# Patient Record
Sex: Male | Born: 1945
Health system: Southern US, Community
[De-identification: ages and names within clinical notes are randomized; demographics above are authoritative.]

## PROBLEM LIST (undated history)

## (undated) DIAGNOSIS — N400 Enlarged prostate without lower urinary tract symptoms: Secondary | ICD-10-CM

## (undated) DIAGNOSIS — F32A Depression, unspecified: Secondary | ICD-10-CM

## (undated) DIAGNOSIS — I499 Cardiac arrhythmia, unspecified: Secondary | ICD-10-CM

## (undated) DIAGNOSIS — K219 Gastro-esophageal reflux disease without esophagitis: Secondary | ICD-10-CM

## (undated) DIAGNOSIS — F329 Major depressive disorder, single episode, unspecified: Secondary | ICD-10-CM

## (undated) DIAGNOSIS — I10 Essential (primary) hypertension: Secondary | ICD-10-CM

## (undated) DIAGNOSIS — J189 Pneumonia, unspecified organism: Secondary | ICD-10-CM

## (undated) DIAGNOSIS — F419 Anxiety disorder, unspecified: Secondary | ICD-10-CM

## (undated) HISTORY — PX: COSMETIC SURGERY: SHX468

## (undated) HISTORY — PX: COLONOSCOPY: SHX5424

## (undated) HISTORY — PX: HERNIA REPAIR: SHX51

---

## 1998-01-06 ENCOUNTER — Ambulatory Visit (HOSPITAL_COMMUNITY): Admission: RE | Admit: 1998-01-06 | Discharge: 1998-01-06 | Payer: Self-pay | Admitting: Cardiology

## 1998-11-21 ENCOUNTER — Ambulatory Visit (HOSPITAL_COMMUNITY): Admission: RE | Admit: 1998-11-21 | Discharge: 1998-11-21 | Payer: Self-pay | Admitting: Gastroenterology

## 1999-05-25 ENCOUNTER — Emergency Department (HOSPITAL_COMMUNITY): Admission: EM | Admit: 1999-05-25 | Discharge: 1999-05-25 | Payer: Self-pay | Admitting: Internal Medicine

## 1999-09-28 ENCOUNTER — Encounter: Payer: Self-pay | Admitting: General Surgery

## 1999-09-29 ENCOUNTER — Observation Stay (HOSPITAL_COMMUNITY): Admission: EM | Admit: 1999-09-29 | Discharge: 1999-09-30 | Payer: Self-pay | Admitting: General Surgery

## 2002-02-06 ENCOUNTER — Emergency Department (HOSPITAL_COMMUNITY): Admission: EM | Admit: 2002-02-06 | Discharge: 2002-02-06 | Payer: Self-pay

## 2002-02-06 ENCOUNTER — Encounter: Payer: Self-pay | Admitting: Internal Medicine

## 2003-02-17 ENCOUNTER — Ambulatory Visit (HOSPITAL_COMMUNITY): Admission: RE | Admit: 2003-02-17 | Discharge: 2003-02-17 | Payer: Self-pay | Admitting: Geriatric Medicine

## 2004-02-24 ENCOUNTER — Ambulatory Visit (HOSPITAL_COMMUNITY): Admission: RE | Admit: 2004-02-24 | Discharge: 2004-02-24 | Payer: Self-pay | Admitting: Gastroenterology

## 2008-07-01 ENCOUNTER — Encounter: Admission: RE | Admit: 2008-07-01 | Discharge: 2008-07-01 | Payer: Self-pay | Admitting: Geriatric Medicine

## 2010-05-19 NOTE — Op Note (Signed)
NAMEFLAVIO, LINDROTH            ACCOUNT NO.:  [id number]   MEDICAL RECORD NO.:  [id number]          PATIENT TYPE:  AMB   LOCATION:  ENDO                         FACILITY:  [facility]   PHYSICIAN:  Bernette Redbird, M.D.   DATE OF BIRTH:  Jul 27, 1945   DATE OF PROCEDURE:  02/24/2004  DATE OF DISCHARGE:                                 OPERATIVE REPORT   PROCEDURE:  Colonoscopy.   INDICATIONS FOR PROCEDURE:  Colon cancer screening.  Negative colonoscopy  five years ago.   FINDINGS:  Normal exam to the cecum.   DESCRIPTION OF PROCEDURE:  The nature, purpose, and risks of the procedure  were familiar to the patient from prior examination, and he provided written  consent.  He opted for this procedure over lesser forms of screening.  Digital exam of the prostate was unremarkable.  Sedation totaled Fentanyl 50  mcg and Versed 7 mg.   The Olympus adjustable tension pediatric videocolonoscope was readily  advanced to the proximal colon whereupon, with the patient in the supine  position and some external abdominal compression, I was able to reach the  base of the cecum as identified by absence of further lumen and clear  visualization of the appendiceal orifice, whereupon pullback was performed.  The quality of the prep was very good, and very little rinsing was needed.  It is felt that all areas were well-seen.   This was a normal examination.  No polyps, cancer, colitis, vascular  malformations, or diverticulosis were noted.  Retroflexion in the rectum was  unremarkable.  No biopsies were obtained.  The patient tolerated the  procedure well, and there were no apparent complications.   IMPRESSION:  Unremarkable screening colonoscopy in a standard risk  individual.  (V76.51).   PLAN:  Flexible sigmoidoscopy in five years for continued screening.   NOTE:  Latex-free gloves were used for this procedure because of history of  a severe latex allergy in the patient's wife who will be driving  him home  today.      RB/MEDQ  D:  02/24/2004  T:  02/24/2004  Job:  [id number]   cc:   Hal T. Stoneking, M.D.  [address] [place], [place] [address]  Fax: [phone]

## 2010-05-19 NOTE — Consult Note (Signed)
NAME:  Kevin Kim, Kevin Kim                      ACCOUNT NO.:  [id number]   MEDICAL RECORD NO.:  [id number]                   PATIENT TYPE:  EMS   LOCATION:  MAJO                                 FACILITY:  [facility]   PHYSICIAN:  Lonia Blood, M.D.                   DATE OF BIRTH:  [birth year]   DATE OF CONSULTATION:  DATE OF DISCHARGE:                                   CONSULTATION   REASON FOR CONSULTATION:  Facial weakness and slurred speech.   HISTORY OF PRESENT ILLNESS:  This is a 64 year old, white male with history  of GERD and previous irregular heart who presented to the emergency room  with a one day history of right-sided facial weakness, decreased taste on  the right side of his tongue and slurred speech.  The patient said symptoms  came suddenly yesterday at work and has been persistent since then.  He  initially called his doctor, Dr. Doran Stabler, who that it might be Bell's  palsy and he was reluctant to come to the hospital until this morning when  he felt like his lower lip is also involved.  He reported pain behind he ear  two to three days before this onset. Also, the patient says he has had what  he calls fever blisters around the right side of his lower lip four weeks  ago.   PAST MEDICAL HISTORY:  1. GERD.  2. 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  3. Status post triple hernia repair three years ago.   ALLERGIES:  No known drug allergies.   MEDICATIONS:  1. Aciphex 20 mg p.o. every day.  2. Centrum Silver multivitamin, one daily.  3. Excedrin, two daily.   REVIEW OF SYSTEMS:  Negative for fever, negative for earache, negative for  nausea and vomiting, negative for weakness, negative for falls, negative for  diplopia or swallowing difficulties.   FAMILY HISTORY:  His mother died of breast cancer, father died of esophageal  cancer.  Grandfather died in his 73s of  stroke.   SOCIAL HISTORY:  He lives in [place] with his wife, he has five children.  His wife works as a Copy.  The patient works at the [employer], usually doing heavy lifting.  He denied any tobacco but does take  occasional alcohol.   PHYSICAL EXAMINATION:  VITAL SIGNS:  Temperature is 97.4, blood pressure  141/85, pulse 76, respiratory rate of 20, saturation 98% on room air.  GENERAL:  He is stable, in no acute distress.  He is a pleasant man.  HEENT:  He has right facial asymmetry.  Pupils are equally round and  reactive to light.  EOMI.  NECK:  Supple, no JVD, no lymphadenopathy.  CHEST:  Clear to auscultation bilaterally.  CARDIOVASCULAR:  Regular rate and rhythm.  ABDOMEN:  Soft,  nontender, positive bowel sounds.  EXTREMITIES:  Show no edema, cyanosis or clubbing.  NEUROLOGICAL:  Alert and oriented times three with right facial droop.  Loss  of right nasolabial fold, but sparing of his forehead.  The patient had good  eye closure, normal sensation in the face, has normal power, 5/5 of both  extremities.  Normal reflexes in both upper and lower extremities as well as  normal sensation, normal gait with nonfocal neurological exam outside the  face.   LABORATORY DATA:  Showed a white count of 5.3, hemoglobin 15, platelets 185,  sodium 141, potassium 4, chloride 108, CO2 27, BUN 17, creatinine 0.8,  glucose 108, calcium 9.1.  He had a CK of 18, MB of 2.0, troponin less than  0.01.  He had an EKG that showed ST-T wave depression in lateral leads as  well as T-wave inversion which, however, historically were not new and were  thought to be due to his abnormal cardiac conduction.  He had a head CT that  was negative for any acute abnormality.   IMPRESSION:  This is a 65 year old, white male with the sudden onset of  right facial weakness, numbness and slurring, most likely diagnosis Bell's  palsy. Other differentials which are very unlikely include Lyme  disease  (this is the wrong season), inner ear tumor, Ramsay Hunt syndrome, HIV  disease, etc.   PLAN:  Discharge the patient home on ganciclovir one gram t.i.d. for seven  days and also Predinsone 60 mg p.o. every day for seven day.  I also  instructed the patient to return to the emergency room to call his doctor if  the symptoms worsen, last more than three weeks or if there is no  improvement in his symptoms after three weeks from today.                                               Lonia Blood, M.D.    Verlin Grills  D:  02/06/2002  T:  02/07/2002  Job:  [id number]   cc:   Cassell Clement, M.D.  [address], [address]  [place]  [place] [address]  Fax: [phone]

## 2010-05-19 NOTE — Op Note (Signed)
[facility]  Patient:    Kevin Kim, Kevin Kim                   MRN: [id number] Proc. Date: 09/29/99 Adm. Date:  [id number] Attending:  Brandy Hale CC:         Clovis Pu. Patty Sermons, M.D.   Operative Report  PREOPERATIVE DIAGNOSES: 1. Bilateral inguinal hernias. 2. Umbilical hernia.  POSTOPERATIVE DIAGNOSES: 1. Bilateral inguinal hernias. 2. Umbilical hernia.  OPERATION PERFORMED: 1. Laparoscopic repair bilateral inguinal hernias (preperitoneal). 2. Umbilical herniorrhaphy.  SURGEON:  Dr. Claud Kelp.  FIRST ASSISTANT:  Dr. Abigail Miyamoto.  INDICATIONS FOR PROCEDURE:  This is a 65 year old white man who presented with fairly recent onset of a painful bulge in this left groin. On examination, he has a moderate sized left inguinal hernia and a small right inguinal hernia and a small umbilical hernia. All of these are reducible. He is brought to the operating room electively for repair of his hernias.  TECHNIQUE:  Following the induction of general endotracheal anesthesia, a Foley catheter was inserted and the bladder was emptied. The abdomen and genitalia were prepped and draped in a sterile fashion. Then 0.5% Marcaine with epinephrine was used as a local infiltration anesthetic. A transverse incision was made below the umbilicus. The fascia was incised transversely exposing the left rectus muscle. The left rectus muscle was retracted laterally and we bluntly dissected the space behind the left rectus muscle and the left rectus sheath. We inserted a dissector balloon in the left rectus sheath and this was inflated manually under direct vision. We had good deployment of the balloon and good visualization of the rectus muscles anteriorly, preperitoneal fat posteriorly, epigastric vessels laterally and at the symphysis pubis inferiorly. We held the balloon in place for 3 or 4 minutes and then deflated the balloon and removed it. We  inserted the insufflating trocar and secured this with the balloon and connected to the insufflator at 12 mmHg. Once inflated, we had good visualization. We put two 5 mm trocars in the midline between the symphysis pubis and the umbilicus. The peritoneum was dissected away laterally where necessary. On the left side, we dissected out a large indirect hernia sac which was dissected away from the cord structures all the way back to the level of the anterior superior iliac spine. The patient also appeared to have a direct hernia on the left hand side. On the right hand side, the patient had a small indirect hernia and we were able to strip that sac well back to the level of the anterior superior iliac spine. We repaired the hernias on each side using a 4 inch x 6 inch piece of polypropylene mesh. On each side, the mesh was placed so as to slightly overlap in the midline. The mesh was tacked to the superior rim of the symphysis pubis and to the superior rim of Coopers ligament with about 5 tacks. We then placed tacks along the posterior belly of the rectus muscle on each side and then laterally, lateral to the inferior epigastric vessels we placed several tacks but laterally we very careful to be sure we could palpate through the abdominal wall to make sure that we did not place any of the tacking devices below the ileopubic tract. On the left hand side, the large indirect hernia sac was then tacked to the mesh laterally with a tacking device to prevent recurrence of the indirect hernia. The areas of repair bilaterally were inspected and  we felt the mesh was deployed smoothly and completely bilaterally without any defects. The pneumoperitoneum was released.  We then turned our attention to the umbilical hernia. We dissected the umbilicus off of the fascia and exposed umbilical hernia defect about 1.5 cm in diameter. We undermined the subcutaneous tissue a little bit. We repaired the umbilical  hernia with 2 interrupted mattress sutures of #0 Novofil. These sutures were placed in a vest-of-pants fashion and then the sutures were tied and this provided good repair with good overlap of the fascia. The fascia inferiorly where we had exposed the left rectus muscle was closed with 2 interrupted figure-of-eight sutures of #0 Novofil. This provided very secure repair of the umbilical hernia and the fascia where the trocars were. The wound was irrigated with saline. The umbilicus was tacked back down to the fascia with 3-0 Vicryl suture. The subcutaneous tissue was closed with interrupted sutures of 3-0 Vicryl and the skin incisions were closed with subcuticular sutures of 4-0 Vicryl and Steri-Strips. Clean bandages were placed and the patient taken to the recovery room in stable condition. Estimated blood loss was about 20 cc. Complications none. Sponge, needle and instrument counts were correct. DD:  09/29/99 TD:  09/29/99 Job: [id number]

## 2010-12-09 ENCOUNTER — Emergency Department (HOSPITAL_COMMUNITY)
Admission: EM | Admit: 2010-12-09 | Discharge: 2010-12-09 | Disposition: A | Discharge status: Home / Self Care | Payer: Federal, State, Local not specified - PPO | Attending: Emergency Medicine | Admitting: Emergency Medicine

## 2010-12-09 DIAGNOSIS — K219 Gastro-esophageal reflux disease without esophagitis: Secondary | ICD-10-CM | POA: Insufficient documentation

## 2010-12-09 DIAGNOSIS — R209 Unspecified disturbances of skin sensation: Secondary | ICD-10-CM | POA: Insufficient documentation

## 2010-12-09 DIAGNOSIS — B029 Zoster without complications: Secondary | ICD-10-CM | POA: Insufficient documentation

## 2010-12-09 HISTORY — DX: Gastro-esophageal reflux disease without esophagitis: K21.9

## 2010-12-09 MED ORDER — PREDNISONE 50 MG PO TABS: ORAL_TABLET | ORAL | Status: DC

## 2010-12-09 MED ORDER — VALACYCLOVIR HCL 1 G PO TABS: 1000.0000 mg | ORAL_TABLET | Freq: Three times a day (TID) | ORAL | Status: AC

## 2010-12-09 NOTE — ED Notes (Signed)
Noticed a pimple like lesion along his left lower chin yesterday--The pimple began to drain and he awoke this a.m. To numbness/tingling sensation of the left side of his face.  No change in taste, no other neuro deficits---Alert and oriented x's 3---Reports having similar type episode several yrs ago which was diagnosed as Bell's Palsy

## 2010-12-09 NOTE — ED Provider Notes (Signed)
History     CSN: [id number] Arrival date & time: 12/09/2010  1:22 PM   First MD Initiated Contact with Patient 12/09/10 1503      Chief Complaint  Patient presents with  . Numbness    (Consider location/radiation/quality/duration/timing/severity/associated sxs/prior treatment) The history is provided by the patient.   the patient is a 65 year old male, with a history of GERD.  He states he recently returned from a trip to [place].  He states this morning when he woke up.  He had numbness on the left side of his face with a single lesion at the corner of his mouth on the left side.  He says that it is as it.  She has not had in many years.  He denies pain anywhere.  He denies vision changes, nausea, vomiting, sore throat, earaches, dizziness, weakness, or paresthesias.  He says that he has had Bell's palsy in the past on the left side about 8 years ago.  He denies smoking.  He denies history of diabetes, hypertension, or coronary artery disease.    Past Medical History  Diagnosis Date  . GERD (gastroesophageal reflux disease)     Past Surgical History  Procedure Date  . Hernia repair     No family history on file.  History  Substance Use Topics  . Smoking status: Current Some Day Smoker  . Smokeless tobacco: Not on file  . Alcohol Use: Yes      Review of Systems  Constitutional: Negative for fever and diaphoresis.  HENT: Negative for neck pain.   Eyes: Negative for redness and visual disturbance.  Respiratory: Negative for cough, chest tightness and shortness of breath.   Cardiovascular: Negative for chest pain and palpitations.  Gastrointestinal: Negative for nausea, vomiting and abdominal pain.  Musculoskeletal: Negative for back pain.  Skin: Negative for rash.       Single 4 mm crusted lesion at the corner of his mouth on the left  Neurological: Positive for numbness. Negative for dizziness, facial asymmetry, weakness and headaches.  Psychiatric/Behavioral:  Negative for confusion.    Allergies  Review of patient's allergies indicates no known allergies.  Home Medications   Current Outpatient Rx  Name Route Sig Dispense Refill  . ASPIRIN 325 MG PO TABS Oral Take 325 mg by mouth daily.      Marland Kitchen LANSOPRAZOLE 30 MG PO CPDR Oral Take 30 mg by mouth daily.      Carma Leaven M PLUS PO TABS Oral Take 1 tablet by mouth daily. Centrum Silver     . VENLAFAXINE HCL 37.5 MG PO TABS Oral Take 37.5 mg by mouth 2 (two) times daily.        BP 136/84  Pulse 75  Temp 98.5 F (36.9 C)  Resp 20  SpO2 100%  Physical Exam  Vitals reviewed. Constitutional: He is oriented to person, place, and time. He appears well-developed and well-nourished. No distress.  HENT:  Head: Normocephalic and atraumatic.  Eyes: EOM are normal. Pupils are equal, round, and reactive to light.  Neck: Normal range of motion. Neck supple.  Cardiovascular: Normal rate, regular rhythm and normal heart sounds.   No murmur heard. Pulmonary/Chest: Effort normal and breath sounds normal. No respiratory distress. He has no wheezes. He has no rales.  Abdominal: Soft. Bowel sounds are normal. He exhibits no distension and no mass. There is no tenderness. There is no rebound and no guarding.  Musculoskeletal: Normal range of motion. He exhibits no edema and no tenderness.  Neurological: He is alert and oriented to person, place, and time. He has normal strength. No cranial nerve deficit. Coordination normal. GCS eye subscore is 4. GCS verbal subscore is 5. GCS motor subscore is 6.       Facial features are symmetric bilaterally.  There is no evidence of a facial nerve deficit.  On the left-hand side at this time.  He does complain of decreased sensation over the maxilla and mandible on the left side, consistent with numbness over the maxillary and mandibular branches of the trigeminal nerve.  Skin: Skin is warm and dry. He is not diaphoretic.       Single 4 mm crusted lesion at the corner of his  mouth on the left-hand side. No rash.  No vesicle  Psychiatric: He has a normal mood and affect. His behavior is normal.    ED Course  Procedures (including critical care time) 65 year old male, with no significant past medical history, and no risk factors for coronary disease for stroke except for his age and sex.  He complains of numbness over the left cheek, and jaw.  He has an isolated crusted lesion at the corner of his mouth on the left-hand side.  There is no evidence of a cranial nerve deficit or peripheral nerve deficit.  He has no carotid bruits.  This may be a recurrence of his Bell's palsy or early shingles.  There is no indication for testing in the emergency department.  At this time.  Labs Reviewed - No data to display No results found.   No diagnosis found.    MDM  Bell's palsy versus shingles No evidence of stroke or systemic illness        Nicholes Stairs, MD 12/09/10 1539

## 2010-12-09 NOTE — ED Notes (Signed)
Pt in with c/o left arm numbness and face states onset 0900 denies weakness denies pain pt present with no other obvious neuro deficits

## 2011-07-04 DIAGNOSIS — Z0000 Encounter for general adult medical examination without abnormal findings: Secondary | ICD-10-CM | POA: Diagnosis not present

## 2011-07-04 DIAGNOSIS — Z23 Encounter for immunization: Secondary | ICD-10-CM | POA: Diagnosis not present

## 2011-07-04 DIAGNOSIS — Z125 Encounter for screening for malignant neoplasm of prostate: Secondary | ICD-10-CM | POA: Diagnosis not present

## 2011-07-04 DIAGNOSIS — Z79899 Other long term (current) drug therapy: Secondary | ICD-10-CM | POA: Diagnosis not present

## 2011-08-07 DIAGNOSIS — J069 Acute upper respiratory infection, unspecified: Secondary | ICD-10-CM | POA: Diagnosis not present

## 2011-10-26 DIAGNOSIS — S0100XA Unspecified open wound of scalp, initial encounter: Secondary | ICD-10-CM | POA: Diagnosis not present

## 2012-01-08 DIAGNOSIS — K219 Gastro-esophageal reflux disease without esophagitis: Secondary | ICD-10-CM | POA: Diagnosis not present

## 2012-01-08 DIAGNOSIS — F329 Major depressive disorder, single episode, unspecified: Secondary | ICD-10-CM | POA: Diagnosis not present

## 2012-02-29 DIAGNOSIS — J069 Acute upper respiratory infection, unspecified: Secondary | ICD-10-CM | POA: Diagnosis not present

## 2012-04-22 DIAGNOSIS — M25519 Pain in unspecified shoulder: Secondary | ICD-10-CM | POA: Diagnosis not present

## 2012-05-01 DIAGNOSIS — M719 Bursopathy, unspecified: Secondary | ICD-10-CM | POA: Diagnosis not present

## 2012-05-01 DIAGNOSIS — M25669 Stiffness of unspecified knee, not elsewhere classified: Secondary | ICD-10-CM | POA: Diagnosis not present

## 2012-05-01 DIAGNOSIS — M25519 Pain in unspecified shoulder: Secondary | ICD-10-CM | POA: Diagnosis not present

## 2012-05-06 DIAGNOSIS — M719 Bursopathy, unspecified: Secondary | ICD-10-CM | POA: Diagnosis not present

## 2012-05-06 DIAGNOSIS — M67919 Unspecified disorder of synovium and tendon, unspecified shoulder: Secondary | ICD-10-CM | POA: Diagnosis not present

## 2012-05-06 DIAGNOSIS — M25669 Stiffness of unspecified knee, not elsewhere classified: Secondary | ICD-10-CM | POA: Diagnosis not present

## 2012-05-06 DIAGNOSIS — M25519 Pain in unspecified shoulder: Secondary | ICD-10-CM | POA: Diagnosis not present

## 2012-05-08 DIAGNOSIS — M25669 Stiffness of unspecified knee, not elsewhere classified: Secondary | ICD-10-CM | POA: Diagnosis not present

## 2012-05-08 DIAGNOSIS — M25519 Pain in unspecified shoulder: Secondary | ICD-10-CM | POA: Diagnosis not present

## 2012-05-08 DIAGNOSIS — M719 Bursopathy, unspecified: Secondary | ICD-10-CM | POA: Diagnosis not present

## 2012-05-12 DIAGNOSIS — M67919 Unspecified disorder of synovium and tendon, unspecified shoulder: Secondary | ICD-10-CM | POA: Diagnosis not present

## 2012-05-12 DIAGNOSIS — M25669 Stiffness of unspecified knee, not elsewhere classified: Secondary | ICD-10-CM | POA: Diagnosis not present

## 2012-05-12 DIAGNOSIS — M25519 Pain in unspecified shoulder: Secondary | ICD-10-CM | POA: Diagnosis not present

## 2012-05-13 DIAGNOSIS — M25519 Pain in unspecified shoulder: Secondary | ICD-10-CM | POA: Diagnosis not present

## 2012-05-15 DIAGNOSIS — M67919 Unspecified disorder of synovium and tendon, unspecified shoulder: Secondary | ICD-10-CM | POA: Diagnosis not present

## 2012-05-15 DIAGNOSIS — M25669 Stiffness of unspecified knee, not elsewhere classified: Secondary | ICD-10-CM | POA: Diagnosis not present

## 2012-05-15 DIAGNOSIS — M25519 Pain in unspecified shoulder: Secondary | ICD-10-CM | POA: Diagnosis not present

## 2012-05-19 DIAGNOSIS — M19019 Primary osteoarthritis, unspecified shoulder: Secondary | ICD-10-CM | POA: Diagnosis not present

## 2012-05-20 DIAGNOSIS — M719 Bursopathy, unspecified: Secondary | ICD-10-CM | POA: Diagnosis not present

## 2012-05-20 DIAGNOSIS — M67919 Unspecified disorder of synovium and tendon, unspecified shoulder: Secondary | ICD-10-CM | POA: Diagnosis not present

## 2012-05-20 DIAGNOSIS — M25669 Stiffness of unspecified knee, not elsewhere classified: Secondary | ICD-10-CM | POA: Diagnosis not present

## 2012-05-20 DIAGNOSIS — M25519 Pain in unspecified shoulder: Secondary | ICD-10-CM | POA: Diagnosis not present

## 2012-05-22 DIAGNOSIS — M19019 Primary osteoarthritis, unspecified shoulder: Secondary | ICD-10-CM | POA: Diagnosis not present

## 2012-05-28 DIAGNOSIS — M67919 Unspecified disorder of synovium and tendon, unspecified shoulder: Secondary | ICD-10-CM | POA: Diagnosis not present

## 2012-05-28 DIAGNOSIS — M25669 Stiffness of unspecified knee, not elsewhere classified: Secondary | ICD-10-CM | POA: Diagnosis not present

## 2012-05-28 DIAGNOSIS — M25519 Pain in unspecified shoulder: Secondary | ICD-10-CM | POA: Diagnosis not present

## 2012-05-28 DIAGNOSIS — M719 Bursopathy, unspecified: Secondary | ICD-10-CM | POA: Diagnosis not present

## 2012-05-30 DIAGNOSIS — M25519 Pain in unspecified shoulder: Secondary | ICD-10-CM | POA: Diagnosis not present

## 2012-05-30 DIAGNOSIS — M67919 Unspecified disorder of synovium and tendon, unspecified shoulder: Secondary | ICD-10-CM | POA: Diagnosis not present

## 2012-05-30 DIAGNOSIS — M25669 Stiffness of unspecified knee, not elsewhere classified: Secondary | ICD-10-CM | POA: Diagnosis not present

## 2012-06-02 DIAGNOSIS — M719 Bursopathy, unspecified: Secondary | ICD-10-CM | POA: Diagnosis not present

## 2012-06-02 DIAGNOSIS — M25519 Pain in unspecified shoulder: Secondary | ICD-10-CM | POA: Diagnosis not present

## 2012-06-02 DIAGNOSIS — M67919 Unspecified disorder of synovium and tendon, unspecified shoulder: Secondary | ICD-10-CM | POA: Diagnosis not present

## 2012-06-02 DIAGNOSIS — M25669 Stiffness of unspecified knee, not elsewhere classified: Secondary | ICD-10-CM | POA: Diagnosis not present

## 2012-06-04 DIAGNOSIS — M25519 Pain in unspecified shoulder: Secondary | ICD-10-CM | POA: Diagnosis not present

## 2012-06-04 DIAGNOSIS — M719 Bursopathy, unspecified: Secondary | ICD-10-CM | POA: Diagnosis not present

## 2012-06-04 DIAGNOSIS — M25669 Stiffness of unspecified knee, not elsewhere classified: Secondary | ICD-10-CM | POA: Diagnosis not present

## 2012-06-09 DIAGNOSIS — M25669 Stiffness of unspecified knee, not elsewhere classified: Secondary | ICD-10-CM | POA: Diagnosis not present

## 2012-06-09 DIAGNOSIS — M25519 Pain in unspecified shoulder: Secondary | ICD-10-CM | POA: Diagnosis not present

## 2012-06-09 DIAGNOSIS — M67919 Unspecified disorder of synovium and tendon, unspecified shoulder: Secondary | ICD-10-CM | POA: Diagnosis not present

## 2012-06-09 DIAGNOSIS — M719 Bursopathy, unspecified: Secondary | ICD-10-CM | POA: Diagnosis not present

## 2012-06-11 DIAGNOSIS — M25669 Stiffness of unspecified knee, not elsewhere classified: Secondary | ICD-10-CM | POA: Diagnosis not present

## 2012-06-11 DIAGNOSIS — M25519 Pain in unspecified shoulder: Secondary | ICD-10-CM | POA: Diagnosis not present

## 2012-06-11 DIAGNOSIS — M719 Bursopathy, unspecified: Secondary | ICD-10-CM | POA: Diagnosis not present

## 2012-06-11 DIAGNOSIS — M67919 Unspecified disorder of synovium and tendon, unspecified shoulder: Secondary | ICD-10-CM | POA: Diagnosis not present

## 2012-06-16 DIAGNOSIS — M25519 Pain in unspecified shoulder: Secondary | ICD-10-CM | POA: Diagnosis not present

## 2012-06-16 DIAGNOSIS — M25669 Stiffness of unspecified knee, not elsewhere classified: Secondary | ICD-10-CM | POA: Diagnosis not present

## 2012-06-16 DIAGNOSIS — M719 Bursopathy, unspecified: Secondary | ICD-10-CM | POA: Diagnosis not present

## 2012-07-07 DIAGNOSIS — Z79899 Other long term (current) drug therapy: Secondary | ICD-10-CM | POA: Diagnosis not present

## 2012-07-07 DIAGNOSIS — E559 Vitamin D deficiency, unspecified: Secondary | ICD-10-CM | POA: Diagnosis not present

## 2012-07-07 DIAGNOSIS — Z125 Encounter for screening for malignant neoplasm of prostate: Secondary | ICD-10-CM | POA: Diagnosis not present

## 2012-07-07 DIAGNOSIS — Z0000 Encounter for general adult medical examination without abnormal findings: Secondary | ICD-10-CM | POA: Diagnosis not present

## 2012-07-07 DIAGNOSIS — E7800 Pure hypercholesterolemia, unspecified: Secondary | ICD-10-CM | POA: Diagnosis not present

## 2012-10-07 DIAGNOSIS — E559 Vitamin D deficiency, unspecified: Secondary | ICD-10-CM | POA: Diagnosis not present

## 2013-01-06 DIAGNOSIS — J069 Acute upper respiratory infection, unspecified: Secondary | ICD-10-CM | POA: Diagnosis not present

## 2013-01-06 DIAGNOSIS — F329 Major depressive disorder, single episode, unspecified: Secondary | ICD-10-CM | POA: Diagnosis not present

## 2013-01-06 DIAGNOSIS — E559 Vitamin D deficiency, unspecified: Secondary | ICD-10-CM | POA: Diagnosis not present

## 2013-02-05 ENCOUNTER — Encounter (HOSPITAL_COMMUNITY): Payer: Self-pay | Admitting: Emergency Medicine

## 2013-02-05 ENCOUNTER — Emergency Department (HOSPITAL_COMMUNITY)
Admission: EM | Admit: 2013-02-05 | Discharge: 2013-02-05 | Disposition: A | Discharge status: Home / Self Care | Payer: Medicare Other | Attending: Emergency Medicine | Admitting: Emergency Medicine

## 2013-02-05 DIAGNOSIS — Z87891 Personal history of nicotine dependence: Secondary | ICD-10-CM | POA: Insufficient documentation

## 2013-02-05 DIAGNOSIS — S8990XA Unspecified injury of unspecified lower leg, initial encounter: Secondary | ICD-10-CM | POA: Diagnosis not present

## 2013-02-05 DIAGNOSIS — K219 Gastro-esophageal reflux disease without esophagitis: Secondary | ICD-10-CM | POA: Insufficient documentation

## 2013-02-05 DIAGNOSIS — Z7982 Long term (current) use of aspirin: Secondary | ICD-10-CM | POA: Insufficient documentation

## 2013-02-05 DIAGNOSIS — M79609 Pain in unspecified limb: Secondary | ICD-10-CM

## 2013-02-05 DIAGNOSIS — Y9389 Activity, other specified: Secondary | ICD-10-CM | POA: Insufficient documentation

## 2013-02-05 DIAGNOSIS — Y929 Unspecified place or not applicable: Secondary | ICD-10-CM | POA: Insufficient documentation

## 2013-02-05 DIAGNOSIS — S99919A Unspecified injury of unspecified ankle, initial encounter: Secondary | ICD-10-CM | POA: Diagnosis not present

## 2013-02-05 DIAGNOSIS — Z79899 Other long term (current) drug therapy: Secondary | ICD-10-CM | POA: Insufficient documentation

## 2013-02-05 DIAGNOSIS — X500XXA Overexertion from strenuous movement or load, initial encounter: Secondary | ICD-10-CM | POA: Insufficient documentation

## 2013-02-05 DIAGNOSIS — IMO0002 Reserved for concepts with insufficient information to code with codable children: Secondary | ICD-10-CM | POA: Insufficient documentation

## 2013-02-05 DIAGNOSIS — S76119A Strain of unspecified quadriceps muscle, fascia and tendon, initial encounter: Secondary | ICD-10-CM

## 2013-02-05 DIAGNOSIS — R109 Unspecified abdominal pain: Secondary | ICD-10-CM | POA: Diagnosis not present

## 2013-02-05 MED ORDER — OXYCODONE-ACETAMINOPHEN 5-325 MG PO TABS
1.0000 | ORAL_TABLET | Freq: Once | ORAL | Status: AC
  Administered 2013-02-05: 1 via ORAL
  Filled 2013-02-05: qty 1

## 2013-02-05 MED ORDER — CYCLOBENZAPRINE HCL 10 MG PO TABS: 10.0000 mg | ORAL_TABLET | Freq: Three times a day (TID) | ORAL | Status: DC | PRN

## 2013-02-05 MED ORDER — IBUPROFEN 800 MG PO TABS: 800.0000 mg | ORAL_TABLET | Freq: Three times a day (TID) | ORAL | Status: DC | PRN

## 2013-02-05 MED ORDER — KETOROLAC TROMETHAMINE 60 MG/2ML IM SOLN
60.0000 mg | Freq: Once | INTRAMUSCULAR | Status: AC
  Administered 2013-02-05: 60 mg via INTRAMUSCULAR
  Filled 2013-02-05: qty 2

## 2013-02-05 MED ORDER — HYDROCODONE-ACETAMINOPHEN 5-325 MG PO TABS: 1.0000 | ORAL_TABLET | Freq: Four times a day (QID) | ORAL | Status: DC | PRN

## 2013-02-05 NOTE — Progress Notes (Signed)
VASCULAR LAB PRELIMINARY  PRELIMINARY  PRELIMINARY  PRELIMINARY  Right lower extremity venous duplex completed.    Preliminary report:  Right:  No evidence of DVT, superficial thrombosis, or Baker's cyst.  Shelbia Scinto, RVS 02/05/2013, 12:45 PM

## 2013-02-05 NOTE — ED Provider Notes (Signed)
CSN: [id number]     Arrival date & time 02/05/13  1740 History   First MD Initiated Contact with Patient 02/05/13 1018     Chief Complaint  Patient presents with  . Leg Pain   (Consider location/radiation/quality/duration/timing/severity/associated sxs/prior Treatment) HPI Patient presents to the emergency with right upper flank pain that started 2 days, ago.  Patient, states, that he was walking when he slipped but did not fall on some pine needles.  Patient, states, that he's also been increasing his walking on a daily basis.  Patient denies shortness of breath, nausea, vomiting, abdominal pain, back pain, dysuria, fever, leg swelling, or rash.  The patient, states, that he did not take any medications prior to arrival.  Patient, states, that nothing seems make his condition, better, but movement makes his pain, worse Past Medical History  Diagnosis Date  . GERD (gastroesophageal reflux disease)    Past Surgical History  Procedure Laterality Date  . Hernia repair     History reviewed. No pertinent family history. History  Substance Use Topics  . Smoking status: Former Research scientist (life sciences)  . Smokeless tobacco: Not on file  . Alcohol Use: Yes    Review of Systems All other systems negative except as documented in the HPI. All pertinent positives and negatives as reviewed in the HPI.   Allergies  Amoxicillin  Home Medications   Current Outpatient Rx  Name  Route  Sig  Dispense  Refill  . aspirin 81 MG chewable tablet   Oral   Chew 81 mg by mouth every morning.         . lansoprazole (PREVACID) 30 MG capsule   Oral   Take 30 mg by mouth every morning.          . Multiple Vitamins-Minerals (MULTIVITAMINS THER. W/MINERALS) TABS   Oral   Take 1 tablet by mouth every morning. Centrum Silver         . venlafaxine (EFFEXOR) 37.5 MG tablet   Oral   Take 18.75 mg by mouth every other day.          . Vitamin D, Ergocalciferol, (DRISDOL) 50000 UNITS CAPS capsule   Oral   Take 1  capsule by mouth once a week. On Fridays          BP 155/73  Pulse 81  Temp(Src) 98.2 F (36.8 C) (Oral)  Resp 28  SpO2 97% Physical Exam  Nursing note and vitals reviewed. Constitutional: He appears well-developed and well-nourished. No distress.  HENT:  Head: Normocephalic and atraumatic.  Cardiovascular: Normal rate, regular rhythm and normal heart sounds.   Pulmonary/Chest: Effort normal and breath sounds normal.  Musculoskeletal:  Patient is tenderness in the mid upper thigh region, worse with movement.  The patient has no sensory deficits in the leg.  No swelling noted.  Patient has normal reflexes.   Skin: Skin is dry. No rash noted. No erythema.    ED Course  Procedures (including critical care time) Patient will be referred back to his primary care Dr. he has a negative DVT study.  Patient is advised of the results.  All questions were asked the patient was given pain medicine here in the emergency department, which improved his symptoms.  He has been using heat but also advised to continue this with ice  Brent General, PA-C 02/05/13 1344

## 2013-02-05 NOTE — ED Provider Notes (Signed)
Medical screening examination/treatment/procedure(s) were performed by non-physician practitioner and as supervising physician I was immediately available for consultation/collaboration.  EKG Interpretation   None         Blanchie Dessert, MD 02/05/13 1555

## 2013-02-05 NOTE — ED Notes (Signed)
Per EMS patient c/o right leg "growing pain," onset yesterday after pulling a muscle walking his dog. Per EMS patient is alert and oriented at baseline.

## 2013-02-05 NOTE — ED Notes (Signed)
Patient was educated not to drive, operate heavy machinery, or drink alcohol while taking narcotic medication.  

## 2013-02-05 NOTE — Discharge Instructions (Signed)
Return here as needed.  Followup with her primary care Dr. for recheck.  Use ice and heat on your thigh area

## 2013-02-05 NOTE — ED Notes (Signed)
Bed: WA15 Expected date:  Expected time:  Means of arrival:  Comments: EMS  

## 2013-02-13 ENCOUNTER — Other Ambulatory Visit: Payer: Self-pay | Admitting: Geriatric Medicine

## 2013-02-13 ENCOUNTER — Ambulatory Visit
Admission: RE | Admit: 2013-02-13 | Discharge: 2013-02-13 | Disposition: A | Discharge status: Home / Self Care | Payer: Medicare Other | Source: Ambulatory Visit | Attending: Geriatric Medicine | Admitting: Geriatric Medicine

## 2013-02-13 DIAGNOSIS — M25559 Pain in unspecified hip: Secondary | ICD-10-CM

## 2013-02-13 DIAGNOSIS — R609 Edema, unspecified: Secondary | ICD-10-CM | POA: Diagnosis not present

## 2013-02-24 ENCOUNTER — Ambulatory Visit: Payer: Medicare Other | Admitting: Physical Therapy

## 2013-03-02 ENCOUNTER — Ambulatory Visit: Payer: Medicare Other | Attending: Geriatric Medicine | Admitting: Physical Therapy

## 2013-03-02 DIAGNOSIS — IMO0001 Reserved for inherently not codable concepts without codable children: Secondary | ICD-10-CM | POA: Insufficient documentation

## 2013-03-02 DIAGNOSIS — R269 Unspecified abnormalities of gait and mobility: Secondary | ICD-10-CM | POA: Diagnosis not present

## 2013-03-02 DIAGNOSIS — M6281 Muscle weakness (generalized): Secondary | ICD-10-CM | POA: Insufficient documentation

## 2013-03-04 ENCOUNTER — Ambulatory Visit: Payer: Medicare Other | Attending: Geriatric Medicine

## 2013-03-04 DIAGNOSIS — IMO0001 Reserved for inherently not codable concepts without codable children: Secondary | ICD-10-CM | POA: Diagnosis not present

## 2013-03-04 DIAGNOSIS — R269 Unspecified abnormalities of gait and mobility: Secondary | ICD-10-CM | POA: Diagnosis not present

## 2013-03-04 DIAGNOSIS — M6281 Muscle weakness (generalized): Secondary | ICD-10-CM | POA: Insufficient documentation

## 2013-03-10 ENCOUNTER — Ambulatory Visit: Payer: Medicare Other | Admitting: Physical Therapy

## 2013-03-12 ENCOUNTER — Ambulatory Visit: Payer: Medicare Other | Admitting: Physical Therapy

## 2013-03-17 ENCOUNTER — Ambulatory Visit: Payer: Medicare Other | Admitting: Physical Therapy

## 2013-03-19 ENCOUNTER — Ambulatory Visit: Payer: Medicare Other | Admitting: Physical Therapy

## 2013-03-24 ENCOUNTER — Ambulatory Visit: Payer: Medicare Other

## 2013-03-26 ENCOUNTER — Ambulatory Visit: Payer: Medicare Other | Admitting: Physical Therapy

## 2013-03-30 ENCOUNTER — Ambulatory Visit: Payer: Medicare Other

## 2013-03-31 ENCOUNTER — Encounter: Payer: Federal, State, Local not specified - PPO | Admitting: Physical Therapy

## 2013-04-22 DIAGNOSIS — H04129 Dry eye syndrome of unspecified lacrimal gland: Secondary | ICD-10-CM | POA: Diagnosis not present

## 2013-05-20 ENCOUNTER — Other Ambulatory Visit: Payer: Self-pay | Admitting: Geriatric Medicine

## 2013-05-20 DIAGNOSIS — M79609 Pain in unspecified limb: Secondary | ICD-10-CM | POA: Diagnosis not present

## 2013-05-20 DIAGNOSIS — R109 Unspecified abdominal pain: Secondary | ICD-10-CM | POA: Diagnosis not present

## 2013-05-20 DIAGNOSIS — R1032 Left lower quadrant pain: Secondary | ICD-10-CM

## 2013-05-21 ENCOUNTER — Encounter (INDEPENDENT_AMBULATORY_CARE_PROVIDER_SITE_OTHER): Payer: Self-pay

## 2013-05-21 ENCOUNTER — Ambulatory Visit
Admission: RE | Admit: 2013-05-21 | Discharge: 2013-05-21 | Disposition: A | Discharge status: Home / Self Care | Payer: Medicare Other | Source: Ambulatory Visit | Attending: Geriatric Medicine | Admitting: Geriatric Medicine

## 2013-05-21 DIAGNOSIS — R1032 Left lower quadrant pain: Secondary | ICD-10-CM

## 2013-05-21 DIAGNOSIS — N3289 Other specified disorders of bladder: Secondary | ICD-10-CM | POA: Diagnosis not present

## 2013-05-21 MED ORDER — IOHEXOL 300 MG/ML  SOLN
100.0000 mL | Freq: Once | INTRAMUSCULAR | Status: AC | PRN
  Administered 2013-05-21: 100 mL via INTRAVENOUS

## 2013-05-30 ENCOUNTER — Emergency Department (HOSPITAL_COMMUNITY)
Admission: EM | Admit: 2013-05-30 | Discharge: 2013-05-30 | Disposition: A | Discharge status: Home / Self Care | Payer: Medicare Other | Attending: Emergency Medicine | Admitting: Emergency Medicine

## 2013-05-30 ENCOUNTER — Encounter (HOSPITAL_COMMUNITY): Payer: Self-pay | Admitting: Emergency Medicine

## 2013-05-30 ENCOUNTER — Emergency Department (HOSPITAL_COMMUNITY): Payer: Medicare Other

## 2013-05-30 DIAGNOSIS — K219 Gastro-esophageal reflux disease without esophagitis: Secondary | ICD-10-CM | POA: Diagnosis not present

## 2013-05-30 DIAGNOSIS — S02401A Maxillary fracture, unspecified, initial encounter for closed fracture: Secondary | ICD-10-CM | POA: Diagnosis not present

## 2013-05-30 DIAGNOSIS — S0230XA Fracture of orbital floor, unspecified side, initial encounter for closed fracture: Secondary | ICD-10-CM | POA: Diagnosis not present

## 2013-05-30 DIAGNOSIS — Z7982 Long term (current) use of aspirin: Secondary | ICD-10-CM | POA: Insufficient documentation

## 2013-05-30 DIAGNOSIS — Z79899 Other long term (current) drug therapy: Secondary | ICD-10-CM | POA: Diagnosis not present

## 2013-05-30 DIAGNOSIS — Y9289 Other specified places as the place of occurrence of the external cause: Secondary | ICD-10-CM | POA: Insufficient documentation

## 2013-05-30 DIAGNOSIS — S02109A Fracture of base of skull, unspecified side, initial encounter for closed fracture: Secondary | ICD-10-CM | POA: Diagnosis not present

## 2013-05-30 DIAGNOSIS — Z87891 Personal history of nicotine dependence: Secondary | ICD-10-CM | POA: Diagnosis not present

## 2013-05-30 DIAGNOSIS — Z880 Allergy status to penicillin: Secondary | ICD-10-CM | POA: Diagnosis not present

## 2013-05-30 DIAGNOSIS — W1809XA Striking against other object with subsequent fall, initial encounter: Secondary | ICD-10-CM | POA: Insufficient documentation

## 2013-05-30 DIAGNOSIS — Y9389 Activity, other specified: Secondary | ICD-10-CM | POA: Insufficient documentation

## 2013-05-30 DIAGNOSIS — S02400A Malar fracture unspecified, initial encounter for closed fracture: Secondary | ICD-10-CM | POA: Diagnosis not present

## 2013-05-30 MED ORDER — HYDROCODONE-ACETAMINOPHEN 5-325 MG PO TABS: 1.0000 | ORAL_TABLET | ORAL | Status: DC | PRN

## 2013-05-30 NOTE — ED Notes (Signed)
Pt was outside when he slipped and fell and hit the left side of his head on the gravel. Pt has orbital edema and reduced vision in the left eye. No LOC and pt is not on blood thinners. Pt went to [facility] and they sent him here for evaluation.

## 2013-05-30 NOTE — ED Provider Notes (Signed)
CSN: [id number]     Arrival date & time 05/30/13  1229 History   First MD Initiated Contact with Patient 05/30/13 1313     Chief Complaint  Patient presents with  . Fall  . Facial Swelling      HPI Patient reports he fell off his truck this morning and fell and struck the left side of his face on the gravel.  Initially had some discomfort and pain and then after he blew his nose he developed significant swelling around his left eye.  He presented to his primary care physician office today who recommended he come to the ER for evaluation.  He denies neck pain.  No weakness of his arms or legs.  He is not on anticoagulants.  No headache at this time.  He reports his vision of his left eye is somewhat decreased but when I lowered his eyelid he states his vision is better.  He has significant swelling of the left side of his face.  He denies trismus or malocclusion.  No dental injury.  No other complaints.  No numbness or tingling of his hands   Past Medical History  Diagnosis Date  . GERD (gastroesophageal reflux disease)    Past Surgical History  Procedure Laterality Date  . Hernia repair     No family history on file. History  Substance Use Topics  . Smoking status: Former Research scientist (life sciences)  . Smokeless tobacco: Never Used  . Alcohol Use: Yes    Review of Systems  All other systems reviewed and are negative.     Allergies  Amoxicillin  Home Medications   Prior to Admission medications   Medication Sig Start Date End Date Taking? Authorizing Provider  aspirin 81 MG chewable tablet Chew 81 mg by mouth every morning.   Yes Historical Provider, MD  lansoprazole (PREVACID) 30 MG capsule Take 30 mg by mouth every morning.    Yes Historical Provider, MD  Multiple Vitamins-Minerals (MULTIVITAMINS THER. W/MINERALS) TABS Take 1 tablet by mouth every morning. Centrum Silver   Yes Historical Provider, MD  Vitamin D, Ergocalciferol, (DRISDOL) 50000 UNITS CAPS capsule Take 50,000 Units by mouth  once a week. On Fridays 01/03/13  Yes Historical Provider, MD  HYDROcodone-acetaminophen (NORCO/VICODIN) 5-325 MG per tablet Take 1 tablet by mouth every 4 (four) hours as needed for moderate pain. 05/30/13   Hoy Morn, MD   BP 146/81  Pulse 72  Temp(Src) 97.5 F (36.4 C) (Oral)  Resp 18  Ht 5\' 8"  (1.727 m)  Wt 195 lb (88.451 kg)  BMI 29.66 kg/m2  SpO2 100% Physical Exam  Nursing note and vitals reviewed. Constitutional: He is oriented to person, place, and time. He appears well-developed and well-nourished.  HENT:  Head: Normocephalic and atraumatic.  No trismus or malocclusion.  Patient with significant preseptal swelling around his left eye.  No erythema.  He has numbness in the distribution of his left infraorbital nerve.  No lacerations.  No ecchymosis.  No acute dental trauma noted  Eyes: EOM are normal. Pupils are equal, round, and reactive to light.  Neck: Normal range of motion.  C-spine nontender.  C-spine cleared by Nexus criteria.  Cardiovascular: Normal rate, regular rhythm, normal heart sounds and intact distal pulses.   Pulmonary/Chest: Effort normal and breath sounds normal. No respiratory distress.  Abdominal: Soft. He exhibits no distension. There is no tenderness.  Musculoskeletal: Normal range of motion.  Neurological: He is alert and oriented to person, place, and time.  Skin: Skin  is warm and dry.  Psychiatric: He has a normal mood and affect. Judgment normal.    ED Course  Procedures (including critical care time) Labs Review Labs Reviewed - No data to display  Imaging Review Ct Maxillofacial Wo Cm  05/30/2013   CLINICAL DATA:  Patient fell off tail native truck this morning into gravel hitting left side effaced with left orbital edema and reduced vision in the left thigh  EXAM: CT MAXILLOFACIAL WITHOUT CONTRAST  TECHNIQUE: Multidetector CT imaging of the maxillofacial structures was performed. Multiplanar CT image reconstructions were also generated. A  small metallic BB was placed on the right temple in order to reliably differentiate right from left.  COMPARISON:  None.  FINDINGS: There is a fracture of the roof of the left maxillary sinus/ floor of the left orbit. The fracture is displaced approximately 9 mm with no evidence of entrapment of the inferior rectus muscle. There is a smaller fluid level in the left maxillary sinus. There is extensive emphysematous change in the inferior eyelid and in the infra orbital preseptal soft tissues. There are no other abnormalities.  IMPRESSION: Fracture of the floor of the left orbit with significant intraorbital soft tissue preseptal emphysema.   Electronically Signed   By: Skipper Cliche M.D.   On: 05/30/2013 14:04  I personally reviewed the imaging tests through PACS system I reviewed available ER/hospitalization records through the EMR    EKG Interpretation None      MDM   Final diagnoses:  Fracture of maxillary sinus   Patient will followup with the ear nose and throat surgeon as well as the ophthalmologist for what appears to be in left orbital floor fracture.  Is preseptal area secondary to blowing his nose.  Patient understands no longer blows nose.  He we treated him with pain medicine.  Overall well-appearing.  C-spine cleared by Nexus criteria.  His vision in his left eye is okay when you lower his lower eyelid which is swollen from the preseptal air    Hoy Morn, MD 05/30/13 1429

## 2013-06-02 DIAGNOSIS — H20019 Primary iridocyclitis, unspecified eye: Secondary | ICD-10-CM | POA: Diagnosis not present

## 2013-06-04 DIAGNOSIS — S0230XA Fracture of orbital floor, unspecified side, initial encounter for closed fracture: Secondary | ICD-10-CM | POA: Diagnosis not present

## 2013-06-10 DIAGNOSIS — H20019 Primary iridocyclitis, unspecified eye: Secondary | ICD-10-CM | POA: Diagnosis not present

## 2013-06-12 DIAGNOSIS — M19019 Primary osteoarthritis, unspecified shoulder: Secondary | ICD-10-CM | POA: Diagnosis not present

## 2013-06-12 DIAGNOSIS — S139XXA Sprain of joints and ligaments of unspecified parts of neck, initial encounter: Secondary | ICD-10-CM | POA: Diagnosis not present

## 2013-06-15 DIAGNOSIS — M19019 Primary osteoarthritis, unspecified shoulder: Secondary | ICD-10-CM | POA: Diagnosis not present

## 2013-06-23 DIAGNOSIS — S139XXA Sprain of joints and ligaments of unspecified parts of neck, initial encounter: Secondary | ICD-10-CM | POA: Diagnosis not present

## 2013-06-23 DIAGNOSIS — M19019 Primary osteoarthritis, unspecified shoulder: Secondary | ICD-10-CM | POA: Diagnosis not present

## 2013-07-06 ENCOUNTER — Other Ambulatory Visit: Payer: Self-pay | Admitting: Orthopedic Surgery

## 2013-07-06 DIAGNOSIS — M542 Cervicalgia: Secondary | ICD-10-CM

## 2013-07-06 DIAGNOSIS — M25512 Pain in left shoulder: Secondary | ICD-10-CM

## 2013-07-08 DIAGNOSIS — Z1331 Encounter for screening for depression: Secondary | ICD-10-CM | POA: Diagnosis not present

## 2013-07-08 DIAGNOSIS — Z79899 Other long term (current) drug therapy: Secondary | ICD-10-CM | POA: Diagnosis not present

## 2013-07-08 DIAGNOSIS — Z0000 Encounter for general adult medical examination without abnormal findings: Secondary | ICD-10-CM | POA: Diagnosis not present

## 2013-07-08 DIAGNOSIS — E559 Vitamin D deficiency, unspecified: Secondary | ICD-10-CM | POA: Diagnosis not present

## 2013-07-08 DIAGNOSIS — M25519 Pain in unspecified shoulder: Secondary | ICD-10-CM | POA: Diagnosis not present

## 2013-07-08 DIAGNOSIS — Z23 Encounter for immunization: Secondary | ICD-10-CM | POA: Diagnosis not present

## 2013-07-08 DIAGNOSIS — E669 Obesity, unspecified: Secondary | ICD-10-CM | POA: Diagnosis not present

## 2013-07-08 DIAGNOSIS — K219 Gastro-esophageal reflux disease without esophagitis: Secondary | ICD-10-CM | POA: Diagnosis not present

## 2013-07-08 DIAGNOSIS — E7800 Pure hypercholesterolemia, unspecified: Secondary | ICD-10-CM | POA: Diagnosis not present

## 2013-07-12 ENCOUNTER — Ambulatory Visit
Admission: RE | Admit: 2013-07-12 | Discharge: 2013-07-12 | Disposition: A | Discharge status: Home / Self Care | Payer: Medicare Other | Source: Ambulatory Visit | Attending: Orthopedic Surgery | Admitting: Orthopedic Surgery

## 2013-07-12 DIAGNOSIS — M25512 Pain in left shoulder: Secondary | ICD-10-CM

## 2013-07-12 DIAGNOSIS — M542 Cervicalgia: Secondary | ICD-10-CM

## 2013-07-12 DIAGNOSIS — M5020 Other cervical disc displacement, unspecified cervical region: Secondary | ICD-10-CM | POA: Diagnosis not present

## 2013-07-14 DIAGNOSIS — T781XXA Other adverse food reactions, not elsewhere classified, initial encounter: Secondary | ICD-10-CM | POA: Diagnosis not present

## 2013-07-14 DIAGNOSIS — T7840XA Allergy, unspecified, initial encounter: Secondary | ICD-10-CM | POA: Diagnosis not present

## 2013-07-21 DIAGNOSIS — M5020 Other cervical disc displacement, unspecified cervical region: Secondary | ICD-10-CM | POA: Diagnosis not present

## 2013-07-21 DIAGNOSIS — M5412 Radiculopathy, cervical region: Secondary | ICD-10-CM | POA: Diagnosis not present

## 2013-07-27 DIAGNOSIS — M542 Cervicalgia: Secondary | ICD-10-CM | POA: Diagnosis not present

## 2013-07-27 DIAGNOSIS — M5412 Radiculopathy, cervical region: Secondary | ICD-10-CM | POA: Diagnosis not present

## 2013-07-30 DIAGNOSIS — M5412 Radiculopathy, cervical region: Secondary | ICD-10-CM | POA: Diagnosis not present

## 2013-07-30 DIAGNOSIS — M542 Cervicalgia: Secondary | ICD-10-CM | POA: Diagnosis not present

## 2013-08-03 DIAGNOSIS — M5412 Radiculopathy, cervical region: Secondary | ICD-10-CM | POA: Diagnosis not present

## 2013-08-03 DIAGNOSIS — M542 Cervicalgia: Secondary | ICD-10-CM | POA: Diagnosis not present

## 2013-08-06 DIAGNOSIS — M542 Cervicalgia: Secondary | ICD-10-CM | POA: Diagnosis not present

## 2013-08-06 DIAGNOSIS — M5412 Radiculopathy, cervical region: Secondary | ICD-10-CM | POA: Diagnosis not present

## 2013-08-10 DIAGNOSIS — M5412 Radiculopathy, cervical region: Secondary | ICD-10-CM | POA: Diagnosis not present

## 2013-08-10 DIAGNOSIS — M542 Cervicalgia: Secondary | ICD-10-CM | POA: Diagnosis not present

## 2013-08-14 DIAGNOSIS — M542 Cervicalgia: Secondary | ICD-10-CM | POA: Diagnosis not present

## 2013-08-14 DIAGNOSIS — M5412 Radiculopathy, cervical region: Secondary | ICD-10-CM | POA: Diagnosis not present

## 2013-08-17 DIAGNOSIS — M5412 Radiculopathy, cervical region: Secondary | ICD-10-CM | POA: Diagnosis not present

## 2013-08-17 DIAGNOSIS — M542 Cervicalgia: Secondary | ICD-10-CM | POA: Diagnosis not present

## 2013-08-20 DIAGNOSIS — M5412 Radiculopathy, cervical region: Secondary | ICD-10-CM | POA: Diagnosis not present

## 2013-08-20 DIAGNOSIS — M542 Cervicalgia: Secondary | ICD-10-CM | POA: Diagnosis not present

## 2013-08-24 DIAGNOSIS — M542 Cervicalgia: Secondary | ICD-10-CM | POA: Diagnosis not present

## 2013-08-24 DIAGNOSIS — M5412 Radiculopathy, cervical region: Secondary | ICD-10-CM | POA: Diagnosis not present

## 2013-08-26 DIAGNOSIS — J3089 Other allergic rhinitis: Secondary | ICD-10-CM | POA: Diagnosis not present

## 2013-08-26 DIAGNOSIS — J3081 Allergic rhinitis due to animal (cat) (dog) hair and dander: Secondary | ICD-10-CM | POA: Diagnosis not present

## 2013-08-26 DIAGNOSIS — J301 Allergic rhinitis due to pollen: Secondary | ICD-10-CM | POA: Diagnosis not present

## 2013-08-26 DIAGNOSIS — Z91010 Allergy to peanuts: Secondary | ICD-10-CM | POA: Diagnosis not present

## 2013-08-27 DIAGNOSIS — M542 Cervicalgia: Secondary | ICD-10-CM | POA: Diagnosis not present

## 2013-08-27 DIAGNOSIS — M5412 Radiculopathy, cervical region: Secondary | ICD-10-CM | POA: Diagnosis not present

## 2013-09-01 DIAGNOSIS — M5412 Radiculopathy, cervical region: Secondary | ICD-10-CM | POA: Diagnosis not present

## 2013-09-01 DIAGNOSIS — M542 Cervicalgia: Secondary | ICD-10-CM | POA: Diagnosis not present

## 2013-09-03 DIAGNOSIS — M542 Cervicalgia: Secondary | ICD-10-CM | POA: Diagnosis not present

## 2013-09-03 DIAGNOSIS — M5412 Radiculopathy, cervical region: Secondary | ICD-10-CM | POA: Diagnosis not present

## 2013-09-09 DIAGNOSIS — M5412 Radiculopathy, cervical region: Secondary | ICD-10-CM | POA: Diagnosis not present

## 2013-09-09 DIAGNOSIS — M542 Cervicalgia: Secondary | ICD-10-CM | POA: Diagnosis not present

## 2013-09-11 DIAGNOSIS — M542 Cervicalgia: Secondary | ICD-10-CM | POA: Diagnosis not present

## 2013-09-11 DIAGNOSIS — M5412 Radiculopathy, cervical region: Secondary | ICD-10-CM | POA: Diagnosis not present

## 2013-09-23 DIAGNOSIS — M5412 Radiculopathy, cervical region: Secondary | ICD-10-CM | POA: Diagnosis not present

## 2013-09-23 DIAGNOSIS — M542 Cervicalgia: Secondary | ICD-10-CM | POA: Diagnosis not present

## 2013-10-24 ENCOUNTER — Emergency Department (HOSPITAL_COMMUNITY): Payer: Medicare Other

## 2013-10-24 ENCOUNTER — Inpatient Hospital Stay (HOSPITAL_COMMUNITY)
Admission: EM | Admit: 2013-10-24 | Discharge: 2013-11-02 | DRG: 492 | Disposition: A | Discharge status: Skilled Nursing Facility | Payer: Medicare Other | Attending: Orthopedic Surgery | Admitting: Orthopedic Surgery

## 2013-10-24 ENCOUNTER — Emergency Department (HOSPITAL_COMMUNITY): Payer: Medicare Other | Admitting: Anesthesiology

## 2013-10-24 ENCOUNTER — Encounter (HOSPITAL_COMMUNITY): Payer: Self-pay | Admitting: Emergency Medicine

## 2013-10-24 ENCOUNTER — Encounter (HOSPITAL_COMMUNITY)
Admission: EM | Disposition: A | Discharge status: Skilled Nursing Facility | Payer: Self-pay | Source: Home / Self Care | Attending: Orthopedic Surgery

## 2013-10-24 ENCOUNTER — Encounter (HOSPITAL_COMMUNITY): Payer: Medicare Other | Admitting: Anesthesiology

## 2013-10-24 DIAGNOSIS — S82401A Unspecified fracture of shaft of right fibula, initial encounter for closed fracture: Secondary | ICD-10-CM | POA: Diagnosis not present

## 2013-10-24 DIAGNOSIS — Z79899 Other long term (current) drug therapy: Secondary | ICD-10-CM

## 2013-10-24 DIAGNOSIS — G510 Bell's palsy: Secondary | ICD-10-CM | POA: Diagnosis present

## 2013-10-24 DIAGNOSIS — S82899A Other fracture of unspecified lower leg, initial encounter for closed fracture: Secondary | ICD-10-CM | POA: Diagnosis not present

## 2013-10-24 DIAGNOSIS — S82831C Other fracture of upper and lower end of right fibula, initial encounter for open fracture type IIIA, IIIB, or IIIC: Secondary | ICD-10-CM

## 2013-10-24 DIAGNOSIS — R338 Other retention of urine: Secondary | ICD-10-CM

## 2013-10-24 DIAGNOSIS — R200 Anesthesia of skin: Secondary | ICD-10-CM | POA: Diagnosis present

## 2013-10-24 DIAGNOSIS — S82873B Displaced pilon fracture of unspecified tibia, initial encounter for open fracture type I or II: Secondary | ICD-10-CM

## 2013-10-24 DIAGNOSIS — S82391C Other fracture of lower end of right tibia, initial encounter for open fracture type IIIA, IIIB, or IIIC: Secondary | ICD-10-CM | POA: Diagnosis not present

## 2013-10-24 DIAGNOSIS — S82891A Other fracture of right lower leg, initial encounter for closed fracture: Secondary | ICD-10-CM | POA: Diagnosis not present

## 2013-10-24 DIAGNOSIS — S82301A Unspecified fracture of lower end of right tibia, initial encounter for closed fracture: Secondary | ICD-10-CM | POA: Diagnosis not present

## 2013-10-24 DIAGNOSIS — Z91010 Allergy to peanuts: Secondary | ICD-10-CM

## 2013-10-24 DIAGNOSIS — Z9181 History of falling: Secondary | ICD-10-CM | POA: Diagnosis not present

## 2013-10-24 DIAGNOSIS — S82831B Other fracture of upper and lower end of right fibula, initial encounter for open fracture type I or II: Secondary | ICD-10-CM | POA: Diagnosis present

## 2013-10-24 DIAGNOSIS — S82873A Displaced pilon fracture of unspecified tibia, initial encounter for closed fracture: Secondary | ICD-10-CM

## 2013-10-24 DIAGNOSIS — S82301B Unspecified fracture of lower end of right tibia, initial encounter for open fracture type I or II: Secondary | ICD-10-CM | POA: Diagnosis not present

## 2013-10-24 DIAGNOSIS — S82839B Other fracture of upper and lower end of unspecified fibula, initial encounter for open fracture type I or II: Secondary | ICD-10-CM | POA: Diagnosis present

## 2013-10-24 DIAGNOSIS — R2681 Unsteadiness on feet: Secondary | ICD-10-CM | POA: Diagnosis not present

## 2013-10-24 DIAGNOSIS — Z8781 Personal history of (healed) traumatic fracture: Secondary | ICD-10-CM

## 2013-10-24 DIAGNOSIS — R339 Retention of urine, unspecified: Secondary | ICD-10-CM | POA: Diagnosis present

## 2013-10-24 DIAGNOSIS — S82301D Unspecified fracture of lower end of right tibia, subsequent encounter for closed fracture with routine healing: Secondary | ICD-10-CM | POA: Diagnosis not present

## 2013-10-24 DIAGNOSIS — S82871B Displaced pilon fracture of right tibia, initial encounter for open fracture type I or II: Principal | ICD-10-CM | POA: Diagnosis present

## 2013-10-24 DIAGNOSIS — Z881 Allergy status to other antibiotic agents status: Secondary | ICD-10-CM | POA: Diagnosis not present

## 2013-10-24 DIAGNOSIS — S82841B Displaced bimalleolar fracture of right lower leg, initial encounter for open fracture type I or II: Secondary | ICD-10-CM | POA: Diagnosis not present

## 2013-10-24 DIAGNOSIS — D62 Acute posthemorrhagic anemia: Secondary | ICD-10-CM | POA: Diagnosis not present

## 2013-10-24 DIAGNOSIS — F17210 Nicotine dependence, cigarettes, uncomplicated: Secondary | ICD-10-CM | POA: Diagnosis present

## 2013-10-24 DIAGNOSIS — K219 Gastro-esophageal reflux disease without esophagitis: Secondary | ICD-10-CM | POA: Diagnosis present

## 2013-10-24 DIAGNOSIS — F329 Major depressive disorder, single episode, unspecified: Secondary | ICD-10-CM | POA: Diagnosis not present

## 2013-10-24 DIAGNOSIS — W11XXXA Fall on and from ladder, initial encounter: Secondary | ICD-10-CM | POA: Diagnosis present

## 2013-10-24 DIAGNOSIS — M6281 Muscle weakness (generalized): Secondary | ICD-10-CM | POA: Diagnosis not present

## 2013-10-24 DIAGNOSIS — Z7982 Long term (current) use of aspirin: Secondary | ICD-10-CM | POA: Diagnosis not present

## 2013-10-24 DIAGNOSIS — R278 Other lack of coordination: Secondary | ICD-10-CM | POA: Diagnosis not present

## 2013-10-24 DIAGNOSIS — Z87891 Personal history of nicotine dependence: Secondary | ICD-10-CM | POA: Diagnosis not present

## 2013-10-24 DIAGNOSIS — S89301A Unspecified physeal fracture of lower end of right fibula, initial encounter for closed fracture: Secondary | ICD-10-CM | POA: Diagnosis not present

## 2013-10-24 DIAGNOSIS — S82251A Displaced comminuted fracture of shaft of right tibia, initial encounter for closed fracture: Secondary | ICD-10-CM | POA: Diagnosis not present

## 2013-10-24 DIAGNOSIS — S82401D Unspecified fracture of shaft of right fibula, subsequent encounter for closed fracture with routine healing: Secondary | ICD-10-CM | POA: Diagnosis not present

## 2013-10-24 DIAGNOSIS — S8290XA Unspecified fracture of unspecified lower leg, initial encounter for closed fracture: Secondary | ICD-10-CM | POA: Diagnosis not present

## 2013-10-24 DIAGNOSIS — S8991XA Unspecified injury of right lower leg, initial encounter: Secondary | ICD-10-CM | POA: Diagnosis not present

## 2013-10-24 DIAGNOSIS — T148XXA Other injury of unspecified body region, initial encounter: Secondary | ICD-10-CM

## 2013-10-24 DIAGNOSIS — S82221D Displaced transverse fracture of shaft of right tibia, subsequent encounter for closed fracture with routine healing: Secondary | ICD-10-CM | POA: Diagnosis not present

## 2013-10-24 DIAGNOSIS — T148 Other injury of unspecified body region: Secondary | ICD-10-CM | POA: Diagnosis not present

## 2013-10-24 DIAGNOSIS — Z9889 Other specified postprocedural states: Secondary | ICD-10-CM

## 2013-10-24 DIAGNOSIS — S82201A Unspecified fracture of shaft of right tibia, initial encounter for closed fracture: Secondary | ICD-10-CM | POA: Diagnosis not present

## 2013-10-24 DIAGNOSIS — S82871C Displaced pilon fracture of right tibia, initial encounter for open fracture type IIIA, IIIB, or IIIC: Secondary | ICD-10-CM

## 2013-10-24 DIAGNOSIS — S82831E Other fracture of upper and lower end of right fibula, subsequent encounter for open fracture type I or II with routine healing: Secondary | ICD-10-CM | POA: Diagnosis not present

## 2013-10-24 DIAGNOSIS — G8918 Other acute postprocedural pain: Secondary | ICD-10-CM | POA: Diagnosis not present

## 2013-10-24 HISTORY — PX: I & D EXTREMITY: SHX5045

## 2013-10-24 HISTORY — DX: Cardiac arrhythmia, unspecified: I49.9

## 2013-10-24 HISTORY — PX: CLOSED REDUCTION TIBIA: SHX5115

## 2013-10-24 HISTORY — PX: EXTERNAL FIXATION LEG: SHX1549

## 2013-10-24 LAB — BASIC METABOLIC PANEL
ANION GAP: 11 (ref 5–15)
BUN: 17 mg/dL (ref 6–23)
CHLORIDE: 101 meq/L (ref 96–112)
CO2: 25 meq/L (ref 19–32)
CREATININE: 0.91 mg/dL (ref 0.50–1.35)
Calcium: 9.1 mg/dL (ref 8.4–10.5)
GFR calc Af Amer: >90 mL/min (ref >90)
GFR calc non Af Amer: 86 mL/min — ABNORMAL LOW (ref >90)
Glucose, Bld: 124 mg/dL — ABNORMAL HIGH (ref 70–99)
POTASSIUM: 4.1 meq/L (ref 3.7–5.3)
SODIUM: 137 meq/L (ref 137–147)

## 2013-10-24 LAB — CBC WITH DIFFERENTIAL/PLATELET
BASOS ABS: 0 10*3/uL (ref 0.0–0.1)
Basophils Relative: 0 % (ref 0–1)
Eosinophils Absolute: 0.1 10*3/uL (ref 0.0–0.7)
Eosinophils Relative: 1 % (ref 0–5)
HEMATOCRIT: 41.6 % (ref 39.0–52.0)
Hemoglobin: 14.6 g/dL (ref 13.0–17.0)
LYMPHS PCT: 19 % (ref 12–46)
Lymphs Abs: 1.7 10*3/uL (ref 0.7–4.0)
MCH: 32.4 pg (ref 26.0–34.0)
MCHC: 35.1 g/dL (ref 30.0–36.0)
MCV: 92.2 fL (ref 78.0–100.0)
MONO ABS: 0.6 10*3/uL (ref 0.1–1.0)
Monocytes Relative: 7 % (ref 3–12)
NEUTROS ABS: 6.7 10*3/uL (ref 1.7–7.7)
NEUTROS PCT: 73 % (ref 43–77)
Platelets: 207 10*3/uL (ref 150–400)
RBC: 4.51 MIL/uL (ref 4.22–5.81)
RDW: 12.2 % (ref 11.5–15.5)
WBC: 9.1 10*3/uL (ref 4.0–10.5)

## 2013-10-24 LAB — APTT: APTT: 25 s (ref 24–37)

## 2013-10-24 SURGERY — IRRIGATION AND DEBRIDEMENT EXTREMITY
Anesthesia: General | Site: Leg Lower | Laterality: Right

## 2013-10-24 MED ORDER — ONDANSETRON HCL 4 MG/2ML IJ SOLN
INTRAMUSCULAR | Status: DC | PRN
  Administered 2013-10-24: 4 mg via INTRAVENOUS

## 2013-10-24 MED ORDER — SODIUM CHLORIDE 0.9 % IV SOLN
INTRAVENOUS | Status: DC
  Administered 2013-10-25 (×2): via INTRAVENOUS
  Filled 2013-10-24 (×11): qty 1000

## 2013-10-24 MED ORDER — CEFAZOLIN SODIUM-DEXTROSE 2-3 GM-% IV SOLR
INTRAVENOUS | Status: DC | PRN
  Administered 2013-10-24: 2 g via INTRAVENOUS

## 2013-10-24 MED ORDER — GLYCOPYRROLATE 0.2 MG/ML IJ SOLN
INTRAMUSCULAR | Status: AC
  Filled 2013-10-24: qty 5

## 2013-10-24 MED ORDER — OXYCODONE HCL 5 MG/5ML PO SOLN: 5.0000 mg | Freq: Once | ORAL | Status: AC | PRN

## 2013-10-24 MED ORDER — LIDOCAINE-EPINEPHRINE (PF) 1.5 %-1:200000 IJ SOLN
INTRAMUSCULAR | Status: DC | PRN
  Administered 2013-10-24: 30 mL via PERINEURAL

## 2013-10-24 MED ORDER — MIDAZOLAM HCL 5 MG/5ML IJ SOLN
INTRAMUSCULAR | Status: DC | PRN
  Administered 2013-10-24: 2 mg via INTRAVENOUS

## 2013-10-24 MED ORDER — NEOSTIGMINE METHYLSULFATE 10 MG/10ML IV SOLN
INTRAVENOUS | Status: DC | PRN
  Administered 2013-10-24: 5 mg via INTRAVENOUS

## 2013-10-24 MED ORDER — DEXTROSE 5 % IV SOLN
INTRAVENOUS | Status: DC | PRN
  Administered 2013-10-24: 21:00:00 via INTRAVENOUS

## 2013-10-24 MED ORDER — STERILE WATER FOR INJECTION IJ SOLN
INTRAMUSCULAR | Status: AC
  Filled 2013-10-24: qty 10

## 2013-10-24 MED ORDER — MIDAZOLAM HCL 2 MG/2ML IJ SOLN
INTRAMUSCULAR | Status: AC
  Filled 2013-10-24: qty 2

## 2013-10-24 MED ORDER — SUCCINYLCHOLINE CHLORIDE 20 MG/ML IJ SOLN
INTRAMUSCULAR | Status: DC | PRN
  Administered 2013-10-24: 100 mg via INTRAVENOUS

## 2013-10-24 MED ORDER — HYDROMORPHONE HCL 1 MG/ML IJ SOLN
1.0000 mg | Freq: Once | INTRAMUSCULAR | Status: AC
  Administered 2013-10-24: 1 mg via INTRAVENOUS
  Filled 2013-10-24: qty 1

## 2013-10-24 MED ORDER — DEXAMETHASONE SODIUM PHOSPHATE 4 MG/ML IJ SOLN
INTRAMUSCULAR | Status: DC | PRN
  Administered 2013-10-24: 8 mg via INTRAVENOUS

## 2013-10-24 MED ORDER — GLYCOPYRROLATE 0.2 MG/ML IJ SOLN
INTRAMUSCULAR | Status: DC | PRN
  Administered 2013-10-24: 0.6 mg via INTRAVENOUS

## 2013-10-24 MED ORDER — EPHEDRINE SULFATE 50 MG/ML IJ SOLN
INTRAMUSCULAR | Status: DC | PRN
  Administered 2013-10-24 (×2): 5 mg via INTRAVENOUS

## 2013-10-24 MED ORDER — PROPOFOL 10 MG/ML IV BOLUS
INTRAVENOUS | Status: AC
  Filled 2013-10-24: qty 20

## 2013-10-24 MED ORDER — PROPOFOL 10 MG/ML IV BOLUS
INTRAVENOUS | Status: DC | PRN
  Administered 2013-10-24: 100 mg via INTRAVENOUS

## 2013-10-24 MED ORDER — LIDOCAINE HCL (CARDIAC) 20 MG/ML IV SOLN
INTRAVENOUS | Status: AC
  Filled 2013-10-24: qty 10

## 2013-10-24 MED ORDER — ARTIFICIAL TEARS OP OINT
TOPICAL_OINTMENT | OPHTHALMIC | Status: DC | PRN
  Administered 2013-10-24: 1 via OPHTHALMIC

## 2013-10-24 MED ORDER — TETANUS-DIPHTH-ACELL PERTUSSIS 5-2.5-18.5 LF-MCG/0.5 IM SUSP
0.5000 mL | Freq: Once | INTRAMUSCULAR | Status: AC
  Administered 2013-10-24: 0.5 mL via INTRAMUSCULAR
  Filled 2013-10-24: qty 0.5

## 2013-10-24 MED ORDER — FENTANYL CITRATE 0.05 MG/ML IJ SOLN
INTRAMUSCULAR | Status: DC | PRN
  Administered 2013-10-24: 50 ug via INTRAVENOUS
  Administered 2013-10-24: 100 ug via INTRAVENOUS
  Administered 2013-10-24 (×3): 50 ug via INTRAVENOUS

## 2013-10-24 MED ORDER — ONDANSETRON HCL 4 MG/2ML IJ SOLN
4.0000 mg | Freq: Once | INTRAMUSCULAR | Status: AC
  Administered 2013-10-24: 4 mg via INTRAVENOUS
  Filled 2013-10-24: qty 2

## 2013-10-24 MED ORDER — SODIUM CHLORIDE 0.9 % IR SOLN
Status: DC | PRN
  Administered 2013-10-24: 1

## 2013-10-24 MED ORDER — OXYCODONE HCL 5 MG PO TABS: 5.0000 mg | ORAL_TABLET | Freq: Once | ORAL | Status: AC | PRN

## 2013-10-24 MED ORDER — LACTATED RINGERS IV SOLN
INTRAVENOUS | Status: DC | PRN
  Administered 2013-10-24 (×2): via INTRAVENOUS

## 2013-10-24 MED ORDER — CEFAZOLIN SODIUM-DEXTROSE 2-3 GM-% IV SOLR
INTRAVENOUS | Status: AC
Start: 2013-10-24 — End: 2013-10-24
  Filled 2013-10-24: qty 50

## 2013-10-24 MED ORDER — LIDOCAINE HCL (CARDIAC) 20 MG/ML IV SOLN
INTRAVENOUS | Status: DC | PRN
  Administered 2013-10-24: 100 mg via INTRAVENOUS

## 2013-10-24 MED ORDER — FENTANYL CITRATE 0.05 MG/ML IJ SOLN
INTRAMUSCULAR | Status: AC
  Filled 2013-10-24: qty 5

## 2013-10-24 MED ORDER — HYDROMORPHONE HCL 1 MG/ML IJ SOLN: 0.2500 mg | INTRAMUSCULAR | Status: DC | PRN

## 2013-10-24 MED ORDER — CEFAZOLIN SODIUM 1-5 GM-% IV SOLN
1.0000 g | Freq: Once | INTRAVENOUS | Status: AC
  Administered 2013-10-24: 1 g via INTRAVENOUS
  Filled 2013-10-24: qty 50

## 2013-10-24 MED ORDER — SODIUM CHLORIDE 0.9 % IV BOLUS (SEPSIS)
1000.0000 mL | Freq: Once | INTRAVENOUS | Status: AC
  Administered 2013-10-24: 1000 mL via INTRAVENOUS

## 2013-10-24 MED ORDER — ONDANSETRON HCL 4 MG/2ML IJ SOLN
4.0000 mg | Freq: Once | INTRAMUSCULAR | Status: AC | PRN
  Administered 2013-10-25: 4 mg via INTRAVENOUS

## 2013-10-24 MED ORDER — MEPERIDINE HCL 25 MG/ML IJ SOLN: 6.2500 mg | INTRAMUSCULAR | Status: DC | PRN

## 2013-10-24 MED ORDER — 0.9 % SODIUM CHLORIDE (POUR BTL) OPTIME
TOPICAL | Status: DC | PRN
  Administered 2013-10-24: 1000 mL

## 2013-10-24 MED ORDER — BUPIVACAINE-EPINEPHRINE (PF) 0.5% -1:200000 IJ SOLN
INTRAMUSCULAR | Status: DC | PRN
  Administered 2013-10-24: 30 mL via PERINEURAL

## 2013-10-24 MED ORDER — VECURONIUM BROMIDE 10 MG IV SOLR
INTRAVENOUS | Status: DC | PRN
Start: 2013-10-24 — End: 2013-10-24
  Administered 2013-10-24: 5 mg via INTRAVENOUS

## 2013-10-24 MED ORDER — NEOSTIGMINE METHYLSULFATE 10 MG/10ML IV SOLN
INTRAVENOUS | Status: AC
  Filled 2013-10-24: qty 1

## 2013-10-24 SURGICAL SUPPLY — 66 items
11mm carbon bar x 400mm ×2 IMPLANT
5mm half pin 160mm x 35mm ×4 IMPLANT
BANDAGE ELASTIC 4 VELCRO ST LF (GAUZE/BANDAGES/DRESSINGS) ×1 IMPLANT
BANDAGE ELASTIC 6 VELCRO ST LF (GAUZE/BANDAGES/DRESSINGS) ×5 IMPLANT
BANDAGE ESMARK 6X9 LF (GAUZE/BANDAGES/DRESSINGS) IMPLANT
BAR EXFX 400X11 NS LF (EXFIX) ×2
BAR GLASS FIBER EXFX 11X400 (EXFIX) ×4 IMPLANT
BNDG CMPR 9X6 STRL LF SNTH (GAUZE/BANDAGES/DRESSINGS)
BNDG COHESIVE 4X5 TAN STRL (GAUZE/BANDAGES/DRESSINGS) ×3 IMPLANT
BNDG ESMARK 6X9 LF (GAUZE/BANDAGES/DRESSINGS)
BNDG GAUZE ELAST 4 BULKY (GAUZE/BANDAGES/DRESSINGS) ×5 IMPLANT
CLAMP BLUE BAR TO PIN (MISCELLANEOUS) ×4 IMPLANT
CLAMP MULTI-PIN 2-BAR 75MM (Clamp) ×2 IMPLANT
COVER SURGICAL LIGHT HANDLE (MISCELLANEOUS) ×4 IMPLANT
CUFF TOURNIQUET SINGLE 34IN LL (TOURNIQUET CUFF) ×2 IMPLANT
CUFF TOURNIQUET SINGLE 44IN (TOURNIQUET CUFF) IMPLANT
DRAPE C-ARM 42X72 X-RAY (DRAPES) ×3 IMPLANT
DRAPE C-ARMOR (DRAPES) ×2 IMPLANT
DRAPE INCISE IOBAN 66X45 STRL (DRAPES) ×3 IMPLANT
DRAPE ORTHO SPLIT 77X108 STRL (DRAPES)
DRAPE SURG ORHT 6 SPLT 77X108 (DRAPES) ×2 IMPLANT
DRAPE U-SHAPE 47X51 STRL (DRAPES) ×3 IMPLANT
DRSG ADAPTIC 3X8 NADH LF (GAUZE/BANDAGES/DRESSINGS) ×3 IMPLANT
ELECT REM PT RETURN 9FT ADLT (ELECTROSURGICAL) ×3
ELECTRODE REM PT RTRN 9FT ADLT (ELECTROSURGICAL) ×1 IMPLANT
EVACUATOR 1/8 PVC DRAIN (DRAIN) IMPLANT
GAUZE SPONGE 4X4 12PLY STRL (GAUZE/BANDAGES/DRESSINGS) ×11 IMPLANT
GAUZE XEROFORM 5X9 LF (GAUZE/BANDAGES/DRESSINGS) ×2 IMPLANT
GLOVE BIO SURGEON ST LM GN SZ9 (GLOVE) ×2 IMPLANT
GLOVE BIO SURGEON STRL SZ7 (GLOVE) ×2 IMPLANT
GLOVE BIOGEL PI IND STRL 6 (GLOVE) IMPLANT
GLOVE BIOGEL PI IND STRL 7.0 (GLOVE) IMPLANT
GLOVE BIOGEL PI IND STRL 7.5 (GLOVE) ×1 IMPLANT
GLOVE BIOGEL PI INDICATOR 6 (GLOVE) ×2
GLOVE BIOGEL PI INDICATOR 7.0 (GLOVE) ×2
GLOVE BIOGEL PI INDICATOR 7.5 (GLOVE) ×2
GLOVE SKINSENSE NS SZ8.0 LF (GLOVE) ×2
GLOVE SKINSENSE STRL SZ8.0 LF (GLOVE) ×2 IMPLANT
GLOVE SURG ORTHO 8.0 STRL STRW (GLOVE) ×1 IMPLANT
GOWN STRL REUS W/ TWL LRG LVL3 (GOWN DISPOSABLE) ×3 IMPLANT
GOWN STRL REUS W/TWL LRG LVL3 (GOWN DISPOSABLE) ×3
KIT BASIN OR (CUSTOM PROCEDURE TRAY) ×3 IMPLANT
KIT ROOM TURNOVER OR (KITS) ×3 IMPLANT
PACK ORTHO EXTREMITY (CUSTOM PROCEDURE TRAY) ×3 IMPLANT
PAD ABD 8X10 STRL (GAUZE/BANDAGES/DRESSINGS) ×2 IMPLANT
PAD ARMBOARD 7.5X6 YLW CONV (MISCELLANEOUS) ×6 IMPLANT
PIN HALF YELLOW 5X160X35 (PIN) ×2 IMPLANT
PIN TRANSFIXING 5.0 (PIN) ×4 IMPLANT
SPONGE GAUZE 4X4 12PLY STER LF (GAUZE/BANDAGES/DRESSINGS) ×2 IMPLANT
SPONGE LAP 18X18 X RAY DECT (DISPOSABLE) ×2 IMPLANT
STAPLER VISISTAT 35W (STAPLE) IMPLANT
STOCKINETTE IMPERVIOUS LG (DRAPES) ×2 IMPLANT
STOCKINETTE TUBULAR 6 INCH (GAUZE/BANDAGES/DRESSINGS) ×1 IMPLANT
SUT ETHILON 2 0 PSLX (SUTURE) ×2 IMPLANT
SUT PROLENE 3 0 PS 2 (SUTURE) IMPLANT
SUT VIC AB 0 CT1 27 (SUTURE) ×3
SUT VIC AB 0 CT1 27XBRD ANBCTR (SUTURE) IMPLANT
SUT VIC AB 2-0 CT1 27 (SUTURE) ×3
SUT VIC AB 2-0 CT1 TAPERPNT 27 (SUTURE) IMPLANT
TOWEL OR 17X24 6PK STRL BLUE (TOWEL DISPOSABLE) ×3 IMPLANT
TOWEL OR 17X26 10 PK STRL BLUE (TOWEL DISPOSABLE) ×3 IMPLANT
TUBE CONNECTING 12'X1/4 (SUCTIONS) ×1
TUBE CONNECTING 12X1/4 (SUCTIONS) ×2 IMPLANT
YANKAUER SUCT BULB TIP NO VENT (SUCTIONS) ×3 IMPLANT
pin to bar clamp ×2 IMPLANT
transfixing pin 5mm x 275mm ×2 IMPLANT

## 2013-10-24 NOTE — ED Notes (Signed)
Ortho tech paged and responded. 

## 2013-10-24 NOTE — ED Provider Notes (Signed)
CSN: [id number]     Arrival date & time 10/24/13  1609 History   First MD Initiated Contact with Patient 10/24/13 1615     Chief Complaint  Patient presents with  . Fall    8 foot - open ankle fracture     (Consider location/radiation/quality/duration/timing/severity/associated sxs/prior Treatment) Patient is a 68 y.o. male presenting with fall. The history is provided by the patient. No language interpreter was used.  Fall This is a new problem. The current episode started today. Associated symptoms include arthralgias and joint swelling. Pertinent negatives include no abdominal pain, chest pain, headaches, nausea, neck pain, numbness, visual change or vomiting. Exacerbated by: movement, pressure on wound. Treatments tried: 10 mg morphine. The treatment provided mild relief.    Past Medical History  Diagnosis Date  . GERD (gastroesophageal reflux disease)   . Irregular heartbeat    Past Surgical History  Procedure Laterality Date  . Hernia repair     No family history on file. History  Substance Use Topics  . Smoking status: Former Research scientist (life sciences)  . Smokeless tobacco: Never Used  . Alcohol Use: Yes    Review of Systems  Eyes: Negative for visual disturbance.  Respiratory: Negative for chest tightness and shortness of breath.   Cardiovascular: Negative for chest pain.  Gastrointestinal: Negative for nausea, vomiting, abdominal pain and abdominal distention.  Musculoskeletal: Positive for arthralgias and joint swelling. Negative for back pain and neck pain.  Skin: Positive for wound.  Neurological: Negative for syncope, numbness and headaches.  Hematological: Does not bruise/bleed easily.  All other systems reviewed and are negative.     Allergies  Amoxicillin  Home Medications   Prior to Admission medications   Medication Sig Start Date End Date Taking? Authorizing Provider  aspirin 81 MG chewable tablet Chew 81 mg by mouth every morning.    Historical Provider, MD   HYDROcodone-acetaminophen (NORCO/VICODIN) 5-325 MG per tablet Take 1 tablet by mouth every 4 (four) hours as needed for moderate pain. 05/30/13   Hoy Morn, MD  lansoprazole (PREVACID) 30 MG capsule Take 30 mg by mouth every morning.     Historical Provider, MD  Multiple Vitamins-Minerals (MULTIVITAMINS THER. W/MINERALS) TABS Take 1 tablet by mouth every morning. Centrum Silver    Historical Provider, MD  Vitamin D, Ergocalciferol, (DRISDOL) 50000 UNITS CAPS capsule Take 50,000 Units by mouth once a week. On Fridays 01/03/13   Historical Provider, MD   BP 120/76  Pulse 72  Resp 12  SpO2 98% Physical Exam  Vitals reviewed. Constitutional: He is oriented to person, place, and time. He appears well-developed and well-nourished.  HENT:  Head: Normocephalic and atraumatic.  Eyes: Pupils are equal, round, and reactive to light.  Neck: No tracheal deviation present.  Cardiovascular: Normal rate, regular rhythm and intact distal pulses.   Pulmonary/Chest: Effort normal and breath sounds normal. He exhibits no tenderness.  Abdominal: Soft. He exhibits no distension. There is no tenderness.  Musculoskeletal:       Right knee: Normal.       Right ankle: He exhibits decreased range of motion, deformity and laceration. Tenderness.  Pelvis stable and nontender to compression. No tenderness or step offs to c/t/l spine. Open fracture of right ankle with exposed bone. Hemostatic. Intact distal pulses  Neurological: He is alert and oriented to person, place, and time. No sensory deficit. GCS eye subscore is 4. GCS verbal subscore is 5. GCS motor subscore is 6.  Skin: Skin is warm.    ED  Course  Procedures (including critical care time) Labs Review Labs Reviewed  BASIC METABOLIC PANEL - Abnormal; Notable for the following:    Glucose, Bld 124 (*)    GFR calc non Af Amer 86 (*)    All other components within normal limits  BASIC METABOLIC PANEL - Abnormal; Notable for the following:    Glucose,  Bld 149 (*)    Calcium 8.3 (*)    All other components within normal limits  CBC WITH DIFFERENTIAL  APTT    Imaging Review Dg Tibia/fibula Right  10/24/2013   CLINICAL DATA:  Intraoperative  EXAM: DG C-ARM 61-120 MIN; RIGHT TIBIA AND FIBULA - 2 VIEW  TECHNIQUE: Intraoperative fluoroscopy is utilized for surgical control purposes.  FLUOROSCOPY TIME:  Fluoroscopy time is reported at 42 seconds.  COMPARISON:  Right ankle 10/24/2013  FINDINGS: Intraoperative spot fluoroscopic views of the right ankle are obtained a demonstrating comminuted fractures of the distal right tibial and fibular shafts and metaphysis seal regions. Fracture lines extend to the tibial articular surface posteriorly with mild step-off at the tibiotalar joint. Soft tissue gas is demonstrated.  IMPRESSION: Intraoperative fluoroscopy obtained for surgical control purposes.   Electronically Signed   By: Lucienne Capers M.D.   On: 10/24/2013 22:24   Ct Ankle Right Wo Contrast  10/25/2013   CLINICAL DATA:  Fall from a ladder. Ankle surgery last night. Fracture of distal right tibia, open type 1 or 2, initial encounter. Distal fibular fracture.  EXAM: CT OF THE RIGHT ANKLE WITHOUT CONTRAST  TECHNIQUE: Multidetector CT imaging of the right ankle was performed according to the standard protocol. Multiplanar CT image reconstructions were also generated.  COMPARISON:  Multiple exams, including 10/24/2013  FINDINGS: Imaging was performed to include the distal tibial and fibular fractures. The imaging extends part way through the rest of the ankle, including most but not all of the calcaneus, some of the cuboid, and some of the cuneiforms. Type 3 Pilon fracture of the distal tibia and fibula noted with a severe degree of comminution distal tibia and fibula causing a shattered bone appearance. There is about 7 mm of proximal impaction/migration of the posterior malleolar dominant fragment along the articular surface as on image 21 of series 8, and  numerous intermediary fragments along the fracture which involves the distal 9.5 cm of the tibia. Various fracture planes and shattered bone are present along the articular surface, with gas tracking along fracture planes, the skin and subcutaneous tissues, in the tibiotalar joint, in the sinus tarsi, and along the distal fibular fracture planes. However, alignment is significantly improved compared to the initial radiographs. There are several fragments of cortical bone within the central fracture complex in the distal tibia measuring up to 9 mm in length.  An external fixator pin passes into the calcaneus ; no significant fracture of the visualized portion of the calcaneus. Fragments lining the tibiotalar joint and talofibular articulation. Linear calcifications in the expected location of on os trigonum may be in the posterior portion of the joint.  Similarly, there is a severely comminuted fracture of the distal fibula with multiple intermediary fragments and expanded appearance of the distal fibula likely related to compressive spreading of fragments as on image 11 of series 8.  Expected degree of surrounding hematoma. On images 37-44 of series 4, some of the distal fibular fracture fragments track around the margins of the peroneus tendons, but I would not call the tendons "entrapped" at this time.  IMPRESSION: 1. Heavily comminuted open  distal type 3 Pilon fracture of the distal tibia and fibula as detailed above. Improved alignment following external fixation. Several small cortical fragments are embedded deep within the central fracture complex in the distal tibial metaphysis. No overt tendon entrapment within the fracture planes observed.   Electronically Signed   By: Sherryl Barters M.D.   On: 10/25/2013 11:38   Dg Knee Right Port  10/24/2013   CLINICAL DATA:  [place] from roof, landing on both feet. Open fracture. Initial encounter.  EXAM: PORTABLE RIGHT KNEE - 1-2 VIEW  COMPARISON:  None.  FINDINGS:  Mild degenerative changes in the right knee with spurring and slight joint space narrowing. No acute bony abnormality. Specifically, no fracture, subluxation, or dislocation. Soft tissues are intact. No joint effusion.  IMPRESSION: No acute bony abnormality.   Electronically Signed   By: Rolm Baptise M.D.   On: 10/24/2013 18:33   Dg Tibia/fibula Right Port  10/24/2013   CLINICAL DATA:  Fall from roof on both feet, open fracture  EXAM: PORTABLE RIGHT TIBIA AND FIBULA - 2 VIEW  COMPARISON:  None.  FINDINGS: No evidence of fracture or dislocation of the proximal tibia/fibula.  Comminuted distal tibia/ fibular fractures are incompletely visualized, better evaluated on dedicated ankle radiographs.  IMPRESSION: No evidence of fracture or dislocation of the proximal tibia/fibula.  Refer to dedicated ankle radiographs for description of comminuted distal tibia/ fibular fractures.   Electronically Signed   By: Julian Hy M.D.   On: 10/24/2013 18:28   Dg Ankle Right Port  10/24/2013   CLINICAL DATA:  Fall from roof on both feet, open fracture  EXAM: PORTABLE RIGHT ANKLE - 2 VIEW  COMPARISON:  None.  FINDINGS: Comminuted, segmental distal fibular fracture with mild displacement/angulation.  Comminuted oblique, segmental distal tibial fracture. Tip of proximal fracture fragment approaches the anterior skin surface, likely corresponding to known open fracture.  Exact tibiotalar relationship is difficult to confirm on these mildly obliqued radiographs but appears approximately preserved.  IMPRESSION: Open comminuted distal tibial fracture, as above.  Comminuted distal fibular fracture, as above.   Electronically Signed   By: Julian Hy M.D.   On: 10/24/2013 18:36   Dg C-arm 1-60 Min  10/24/2013   CLINICAL DATA:  Intraoperative  EXAM: DG C-ARM 61-120 MIN; RIGHT TIBIA AND FIBULA - 2 VIEW  TECHNIQUE: Intraoperative fluoroscopy is utilized for surgical control purposes.  FLUOROSCOPY TIME:  Fluoroscopy time  is reported at 42 seconds.  COMPARISON:  Right ankle 10/24/2013  FINDINGS: Intraoperative spot fluoroscopic views of the right ankle are obtained a demonstrating comminuted fractures of the distal right tibial and fibular shafts and metaphysis seal regions. Fracture lines extend to the tibial articular surface posteriorly with mild step-off at the tibiotalar joint. Soft tissue gas is demonstrated.  IMPRESSION: Intraoperative fluoroscopy obtained for surgical control purposes.   Electronically Signed   By: Lucienne Capers M.D.   On: 10/24/2013 22:24     EKG Interpretation None      MDM   Final diagnoses:  Open fracture   68 y/o male with fall from ladder 8 ft with open tibial fx. AAO on arrival. Vitals stable. No additional injuries on exam. Ancef and tetanus given. Orthopedics consulted and pt taken to OR.   Imaging reviewed in my medical decision making. Pt discussed with my attending, Dr. Regenia Skeeter.      Amparo Bristol, MD 10/25/13 1524

## 2013-10-24 NOTE — Anesthesia Procedure Notes (Addendum)
Anesthesia Regional Block:  Popliteal block  Pre-Anesthetic Checklist: ,, timeout performed, Correct Patient, Correct Site, Correct Laterality, Correct Procedure, Correct Position, site marked, Risks and benefits discussed,  Surgical consent,  Pre-op evaluation,  At surgeon's request and post-op pain management  Laterality: Right  Prep: chloraprep       Needles:  Injection technique: Single-shot  Needle Type: Echogenic Stimulator Needle     Needle Length: 10cm 10 cm Needle Gauge: 21 and 21 G    Additional Needles:  Procedures: ultrasound guided (picture in chart) and nerve stimulator Popliteal block  Nerve Stimulator or Paresthesia:  Response: 0.4 mA,   Additional Responses:   Narrative:  Start time: 10/24/2013 8:15 PM End time: 10/24/2013 8:25 PM Injection made incrementally with aspirations every 5 mL.  Performed by: Personally  Anesthesiologist: Lillia Abed MD  Additional Notes: Monitors applied. Patient sedated. Sterile prep and drape,hand hygiene and sterile gloves were used. Relevant anatomy identified.Needle position confirmed.Local anesthetic injected incrementally after negative aspiration. Local anesthetic spread visualized around nerve(s). Vascular puncture avoided. No complications. Image printed for medical record.The patient tolerated the procedure well.  Additional Saphenous nerve block performed. 15cc Local Anesthetic mixture placed under ultrasonic guidance along the medio-inferior border of the Sartorious muscle 6 inches above the knee.  No Problems encountered.  Lillia Abed MD    Procedure Name: Intubation Date/Time: 10/24/2013 8:44 PM Performed by: Jacquiline Doe A Pre-anesthesia Checklist: Patient identified, Timeout performed, Emergency Drugs available, Suction available and Patient being monitored Patient Re-evaluated:Patient Re-evaluated prior to inductionOxygen Delivery Method: Circle system utilized Preoxygenation: Pre-oxygenation with 100%  oxygen Intubation Type: IV induction, Rapid sequence and Cricoid Pressure applied Laryngoscope Size: Mac and 4 Grade View: Grade I Tube type: Oral Tube size: 8.0 mm Number of attempts: 1 Airway Equipment and Method: Stylet Placement Confirmation: ETT inserted through vocal cords under direct vision,  breath sounds checked- equal and bilateral and positive ETCO2 Secured at: 24 cm Tube secured with: Tape Dental Injury: Teeth and Oropharynx as per pre-operative assessment

## 2013-10-24 NOTE — Transfer of Care (Signed)
Immediate Anesthesia Transfer of Care Note  Patient: Kevin Kim  Procedure(s) Performed: Procedure(s): IRRIGATION AND DEBRIDEMENT OPEN RIGHT TIBIA/FIBULA FRACTURE (Right) EXTERNAL FIXATION RIGHT LOWER LEG (Right) CLOSED REDUCTION TIBIA/FIBULA FRACTURE (Right)  Patient Location: PACU  Anesthesia Type:GA combined with regional for post-op pain  Level of Consciousness: oriented, sedated, patient cooperative and responds to stimulation  Airway & Oxygen Therapy: Patient Spontanous Breathing and Patient connected to nasal cannula oxygen  Post-op Assessment: Report given to PACU RN, Post -op Vital signs reviewed and stable, Patient moving all extremities and Patient moving all extremities X 4  Post vital signs: Reviewed and stable  Complications: No apparent anesthesia complications

## 2013-10-24 NOTE — Op Note (Signed)
Kevin Kim, Kevin Kim            ACCOUNT NO.:  [id number]  MEDICAL RECORD NO.:  [id number]  LOCATION:  [facility]                         FACILITY:  [facility]  PHYSICIAN:  Pietro Cassis. Alvan Dame, M.D.  DATE OF BIRTH:  1945-04-17  DATE OF PROCEDURE:  10/24/2013 DATE OF DISCHARGE:                              OPERATIVE REPORT   PREOPERATIVE DIAGNOSIS:  Open right tibia-fibula fracture on the right.  POSTOPERATIVE DIAGNOSIS/FINDINGS:  Grade 2 open right distal metadiaphyseal tibia-fibula fracture with extension into the joint.  PROCEDURE: 1. Both excisional and nonexcisional debridement of the right leg     wound, excisional debridement including sharp excision of skin and     subcutaneous tissue, bone fragments.  This was followed by non-     excisional debridement with 2 L normal saline solution irrigation. 2. Closed reduction and application of the Zimmer external fixator,     spanning external fixator. 3. Examination under fluoro.  SURGEON:  Pietro Cassis. Alvan Dame, MD  ASSISTANT:  Surgical team.  ANESTHESIA:  General.  BLOOD LOSS:  Probably about less than 100 mL from the fracture site.  COMPLICATION:  None.  DRAINS:  None.  TOURNIQUET:  Not utilized.  INDICATIONS FOR PROCEDURE:  Kevin Kim is a very pleasant 68 year old male who was working on a house on a A-frame ladder when it tipped to the side, he jumped off of it, landing on his feet, had immediate onset of pain, deformity, as well as evidence of a compound fracture.  He was brought to the emergency room.  Radiographic workup revealed the fracture pattern.  He was seen and evaluated in the emergency room, and I discussed with him the possibility of trying to fix this tonight with an intramedullary nail versus external fixator based on either wound and/or the complications of the fracture.  Consent was obtained for management of his acute injury.  PROCEDURE IN DETAIL:  The patient was brought to operative theater. Once adequate  anesthesia, prep was established, he was positioned supine.  We did give him 2 more g of Ancef as he had received 1 g Ancef in the emergency room, witnessed with a tetanus update.  The right lower extremity was then pre-draped with a U-drape at the thigh.  We then pre clipped his skin for above-stated procedures.  We then prepped and draped the right leg with Betadine scrub and paint due to the open fracture site.  Time-out was performed identifying the patient, planned procedure, and extremity.  Once the fluoroscopy machine was draped, I applied traction and evaluated the traction based x-ray, this revealed significant comminution that was initially anticipated with the initial radiographs in the ER and based on this, I decided to place an external fixator as opposed to trying to fix it tonight due to the medial based L-shaped wound.  Once this was determined and the Zimmer external fixator set was opened, I first attended to the wound.  Using a 15 blade, I excised the skin edges, the underlying subcutaneous tissue, any nonviable or an ill-appearing tissues, as well as bone fragments that would basically fall out of the wound.  These were small and only 2 or 3 of them.  Following this debridement, I  then irrigated the wound with 2 L normal saline solution.  I then chose at this point, prior to placement of the external fixator, closed the wound to prevent excessive tissue and thin skin tissue on to make certain, I could get skin closure.  The skin edges were then loosely reapproximated using 2-0 nylon with the intention of not trying to get it perfectly sealed, at this point, due to the fact that he is going to have to have another operation repeat debridement.  Upon closure of the wound, we now attended to the closed reduction and application of the external fixator.  A transcalcaneal pin was placed, fluoroscopy was utilized to identify a position in the posterior calcaneus  within the safe zone.  Once this pin was placed, I then placed 2 pins in the tibia anteriorly and proximal.  I then applied a pin-to- pin clamp.  I then placed 2 bars, 1 medial 1 lateral, tightening down on the transcalcaneal pin, and then under fluoroscopic imaging, the fracture was at least reduced and placed it out to length as appropriately necessary and then tightened down proximally.  All of the screws along the bars and clamps were retention using the T-handle.  At this point, final pictures were taken with fluoroscopy.  I then cleaned his leg, applied Xeroform to each of the pins inserted through the skin and then also on the open wound, then dressed each with gauze and a bulky wrap.  He was then extubated and brought to the recovery room in stable condition.  Postoperatively, he will be nonweightbearing, elevated his right lower extremity, will need to order CT scan to better assess the fracture pattern and we will consult taking to Dr. Altamese Stapleton or Dr. Wylene Simmer, for definitive management of this complex injury.     Pietro Cassis Alvan Dame, M.D.     MDO/MEDQ  D:  10/24/2013  T:  10/24/2013  Job:  [id number]

## 2013-10-24 NOTE — H&P (Signed)
Kevin Kim is an 68 y.o. male.    Chief Complaint:  Open right distal tibia/fibula fracture   HPI: Pt is a 68 y.o. male working on an A-frame ladder when it tipped to the side.  As it fell he lept from it landing on his feet with torsion.  Immediate onset of pain, deformity, associated with an open wound.  Brought to ER where work-up was negative for any other injury or complaints.  Ortho consulted for admission and management of his lower extremity injury. In C-collar upon my evaluation but no neck complaints.  PCP:  Mathews Argyle, MD  D/C Plans: To be determined following appropriate treatment plan  PMH: Past Medical History  Diagnosis Date  . GERD (gastroesophageal reflux disease)   . Irregular heartbeat     PSH: Past Surgical History  Procedure Laterality Date  . Hernia repair      Social History:  reports that he has quit smoking. He has never used smokeless tobacco. He reports that he drinks alcohol. He reports that he does not use illicit drugs.  Allergies:  Allergies  Allergen Reactions  . Amoxicillin Other (See Comments)    Gi upset; abdominal pain  . Peanut-Containing Drug Products Hives    All nuts    Medications:  (Not in a hospital admission)  Results for orders placed during the hospital encounter of 10/24/13 (from the past 48 hour(s))  CBC WITH DIFFERENTIAL     Status: None   Collection Time    10/24/13  4:40 PM      Result Value Ref Range   WBC 9.1  4.0 - 10.5 K/uL   RBC 4.51  4.22 - 5.81 MIL/uL   Hemoglobin 14.6  13.0 - 17.0 g/dL   HCT 41.6  39.0 - 52.0 %   MCV 92.2  78.0 - 100.0 fL   MCH 32.4  26.0 - 34.0 pg   MCHC 35.1  30.0 - 36.0 g/dL   RDW 12.2  11.5 - 15.5 %   Platelets 207  150 - 400 K/uL   Neutrophils Relative % 73  43 - 77 %   Neutro Abs 6.7  1.7 - 7.7 K/uL   Lymphocytes Relative 19  12 - 46 %   Lymphs Abs 1.7  0.7 - 4.0 K/uL   Monocytes Relative 7  3 - 12 %   Monocytes Absolute 0.6  0.1 - 1.0 K/uL   Eosinophils  Relative 1  0 - 5 %   Eosinophils Absolute 0.1  0.0 - 0.7 K/uL   Basophils Relative 0  0 - 1 %   Basophils Absolute 0.0  0.0 - 0.1 K/uL  BASIC METABOLIC PANEL     Status: Abnormal   Collection Time    10/24/13  4:40 PM      Result Value Ref Range   Sodium 137  137 - 147 mEq/L   Potassium 4.1  3.7 - 5.3 mEq/L   Chloride 101  96 - 112 mEq/L   CO2 25  19 - 32 mEq/L   Glucose, Bld 124 (*) 70 - 99 mg/dL   BUN 17  6 - 23 mg/dL   Creatinine, Ser 0.91  0.50 - 1.35 mg/dL   Calcium 9.1  8.4 - 10.5 mg/dL   GFR calc non Af Amer 86 (*) >90 mL/min   GFR calc Af Amer >90  >90 mL/min   Comment: (NOTE)     The eGFR has been calculated using the CKD EPI equation.  This calculation has not been validated in all clinical situations.     eGFR's persistently <90 mL/min signify possible Chronic Kidney     Disease.   Anion gap 11  5 - 15  APTT     Status: None   Collection Time    10/24/13  4:40 PM      Result Value Ref Range   aPTT 25  24 - 37 seconds   Dg Knee Right Port  10/24/2013   CLINICAL DATA:  Fell from roof, landing on both feet. Open fracture. Initial encounter.  EXAM: PORTABLE RIGHT KNEE - 1-2 VIEW  COMPARISON:  None.  FINDINGS: Mild degenerative changes in the right knee with spurring and slight joint space narrowing. No acute bony abnormality. Specifically, no fracture, subluxation, or dislocation. Soft tissues are intact. No joint effusion.  IMPRESSION: No acute bony abnormality.   Electronically Signed   By: Rolm Baptise M.D.   On: 10/24/2013 18:33   Dg Tibia/fibula Right Port  10/24/2013   CLINICAL DATA:  Fall from roof on both feet, open fracture  EXAM: PORTABLE RIGHT TIBIA AND FIBULA - 2 VIEW  COMPARISON:  None.  FINDINGS: No evidence of fracture or dislocation of the proximal tibia/fibula.  Comminuted distal tibia/ fibular fractures are incompletely visualized, better evaluated on dedicated ankle radiographs.  IMPRESSION: No evidence of fracture or dislocation of the proximal  tibia/fibula.  Refer to dedicated ankle radiographs for description of comminuted distal tibia/ fibular fractures.   Electronically Signed   By: Julian Hy M.D.   On: 10/24/2013 18:28   Dg Ankle Right Port  10/24/2013   CLINICAL DATA:  Fall from roof on both feet, open fracture  EXAM: PORTABLE RIGHT ANKLE - 2 VIEW  COMPARISON:  None.  FINDINGS: Comminuted, segmental distal fibular fracture with mild displacement/angulation.  Comminuted oblique, segmental distal tibial fracture. Tip of proximal fracture fragment approaches the anterior skin surface, likely corresponding to known open fracture.  Exact tibiotalar relationship is difficult to confirm on these mildly obliqued radiographs but appears approximately preserved.  IMPRESSION: Open comminuted distal tibial fracture, as above.  Comminuted distal fibular fracture, as above.   Electronically Signed   By: Julian Hy M.D.   On: 10/24/2013 18:36    ROS: Review of Systems - Negative except recent trauma involving another fall that resulted in facial fractures General ROS: negative for - chills, fatigue, fever or sleep disturbance Ophthalmic ROS: negative for - blurry vision, double vision or excessive tearing Respiratory ROS: no cough, shortness of breath, or wheezing Cardiovascular ROS: no chest pain or dyspnea on exertion Gastrointestinal ROS: no abdominal pain, change in bowel habits, or black or bloody stools Genito-Urinary ROS: no dysuria, trouble voiding, or hematuria Musculoskeletal ROS: recent c-spine strain from injury resulting in facial fracture, otherwise normal  Physican Exam: Blood pressure 129/76, pulse 72, resp. rate 11, SpO2 97.00%. .physicalexam  Awake alert Some nausea related to medication on an empty stomach Neck  - non-tender, normal ROM with out pain Chest clear Heart regular Abdomen - moderate belly, non tender  Right lower leg in splint with obvious external rotation deformity Brisk capillary refill  distally No other extremity concerns or findings  Assessment/Plan Assessment:  Grade I-II open right distal meta-diaphyseal tibia/fibula fracture   Plan:  Received IV Ancef upon admission to ER, will repeat in OR.  Tetanus updated in ER  Patient will be taken to the operating tonight for form I&D of right leg wound with open reduction internal fixation  versus external fixation of the right leg depending on the extent of the zone of injury. Risks benefits and expectation were discussed with the patient and his family. Patient understand risks, benefits and expectation and wishes to proceed. Consent filled out.  Admit post-op for pain and therapy, particularly if able to complete treatment tonight   Pietro Cassis. Alvan Dame, MD  10/24/2013, 7:33 PM

## 2013-10-24 NOTE — Anesthesia Preprocedure Evaluation (Addendum)
Anesthesia Evaluation  Patient identified by MRN, date of birth, ID band Patient awake    Reviewed: Allergy & Precautions, H&P , NPO status , Patient's Chart, lab work & pertinent test results  Airway Mallampati: I TM Distance: >3 FB Neck ROM: Full    Dental   Pulmonary former smoker,          Cardiovascular     Neuro/Psych    GI/Hepatic GERD-  Medicated and Controlled,  Endo/Other    Renal/GU      Musculoskeletal   Abdominal   Peds  Hematology   Anesthesia Other Findings   Reproductive/Obstetrics                          Anesthesia Physical Anesthesia Plan  ASA: II and emergent  Anesthesia Plan: General   Post-op Pain Management:    Induction: Intravenous  Airway Management Planned: Oral ETT  Additional Equipment:   Intra-op Plan:   Post-operative Plan: Extubation in OR  Informed Consent: I have reviewed the patients History and Physical, chart, labs and discussed the procedure including the risks, benefits and alternatives for the proposed anesthesia with the patient or authorized representative who has indicated his/her understanding and acceptance.     Plan Discussed with: CRNA and Surgeon  Anesthesia Plan Comments: (Pt had EKG with lateral STT wave changes. Daughter, who is an echo tech, stated that he was worked up by Dr Mare Ferrari 10-15 years ago, with a clean cardiac cath. No old records available. Pt asymptomatic. OK to proceed.)       Anesthesia Quick Evaluation

## 2013-10-24 NOTE — ED Notes (Signed)
Ortho surgeon at bedside 

## 2013-10-24 NOTE — Progress Notes (Signed)
Orthopedic Tech Progress Note Patient Details:  Kevin Kim 21-Nov-1945 [id number]  Ortho Devices Type of Ortho Device: Ace wrap;Post (short leg) splint Ortho Device/Splint Location: rle Ortho Device/Splint Interventions: Application   Mady Oubre 10/24/2013, 6:27 PM

## 2013-10-24 NOTE — ED Notes (Signed)
Ortho paged. 

## 2013-10-24 NOTE — Brief Op Note (Signed)
10/24/2013  10:08 PM  PATIENT:  Kevin Kim  68 y.o. male  PRE-OPERATIVE DIAGNOSIS:  Grade II open right distal meta-diaphyseal fracture with likely extension to the joint.  POST-OPERATIVE DIAGNOSIS:  Grade II open right distal meta-diaphyseal fracture with likely extension to the joint.  PROCEDURE:  Procedure(s): 1.  IRRIGATION AND DEBRIDEMENT OPEN RIGHT TIBIA/FIBULA FRACTURE (Right), excisional debridement sharply with a scalpel of skin, subcutaneous tissue, bone fragments followed by non-excisional debridement of the wound with 2 liters NS irrigation  2.  Closed reduction of right tibia/fibula fractures with application of External Fixator (Zimmer)   SURGEON:  Surgeon(s) and Role:    * Mauri Pole, MD - Primary  PHYSICIAN ASSISTANT: None  ANESTHESIA:   general  EBL:  Total I/O In: 1000 [I.V.:1000] Out: -   BLOOD ADMINISTERED:none  DRAINS: none   LOCAL MEDICATIONS USED:  NONE  SPECIMEN:  No Specimen  DISPOSITION OF SPECIMEN:  N/A  COUNTS:  YES  TOURNIQUET:    DICTATION: .Other Dictation: Dictation Number [id number]  PLAN OF CARE: Admit to inpatient   PATIENT DISPOSITION:  PACU - hemodynamically stable.   Delay start of Pharmacological VTE agent (>24hrs) due to surgical blood loss or risk of bleeding: no

## 2013-10-24 NOTE — ED Notes (Signed)
Pt was working on roof and ladder moved and pt landed on both feet.  Open fracture to R ankle.  CMS intact per ems.  10 mg morphine given with no relief.  No loc.

## 2013-10-25 ENCOUNTER — Inpatient Hospital Stay (HOSPITAL_COMMUNITY): Payer: Medicare Other

## 2013-10-25 ENCOUNTER — Encounter (HOSPITAL_COMMUNITY): Payer: Self-pay | Admitting: Radiology

## 2013-10-25 LAB — BASIC METABOLIC PANEL
ANION GAP: 14 (ref 5–15)
BUN: 13 mg/dL (ref 6–23)
CHLORIDE: 102 meq/L (ref 96–112)
CO2: 22 mEq/L (ref 19–32)
Calcium: 8.3 mg/dL — ABNORMAL LOW (ref 8.4–10.5)
Creatinine, Ser: 0.77 mg/dL (ref 0.50–1.35)
GFR calc non Af Amer: >90 mL/min (ref >90)
Glucose, Bld: 149 mg/dL — ABNORMAL HIGH (ref 70–99)
Potassium: 4.6 mEq/L (ref 3.7–5.3)
SODIUM: 138 meq/L (ref 137–147)

## 2013-10-25 MED ORDER — METHOCARBAMOL 1000 MG/10ML IJ SOLN: 500.0000 mg | Freq: Four times a day (QID) | INTRAMUSCULAR | Status: DC | PRN

## 2013-10-25 MED ORDER — ADULT MULTIVITAMIN W/MINERALS CH
1.0000 | ORAL_TABLET | Freq: Every morning | ORAL | Status: DC
  Administered 2013-10-25 – 2013-11-02 (×8): 1 via ORAL
  Filled 2013-10-25 (×9): qty 1

## 2013-10-25 MED ORDER — HYDROMORPHONE HCL 1 MG/ML IJ SOLN
0.5000 mg | INTRAMUSCULAR | Status: DC | PRN
  Administered 2013-10-26: 2 mg via INTRAVENOUS
  Administered 2013-10-26: 1 mg via INTRAVENOUS
  Filled 2013-10-25: qty 1
  Filled 2013-10-25: qty 2

## 2013-10-25 MED ORDER — METHOCARBAMOL 500 MG PO TABS
500.0000 mg | ORAL_TABLET | Freq: Four times a day (QID) | ORAL | Status: DC | PRN
  Administered 2013-10-25 (×2): 500 mg via ORAL
  Filled 2013-10-25 (×2): qty 1

## 2013-10-25 MED ORDER — POLYETHYLENE GLYCOL 3350 17 G PO PACK
17.0000 g | PACK | Freq: Two times a day (BID) | ORAL | Status: DC
  Administered 2013-10-25 – 2013-11-02 (×12): 17 g via ORAL
  Filled 2013-10-25 (×20): qty 1

## 2013-10-25 MED ORDER — METOCLOPRAMIDE HCL 10 MG PO TABS
5.0000 mg | ORAL_TABLET | Freq: Three times a day (TID) | ORAL | Status: DC | PRN
  Administered 2013-11-01: 10 mg via ORAL
  Filled 2013-10-25: qty 1

## 2013-10-25 MED ORDER — ONDANSETRON HCL 4 MG/2ML IJ SOLN
INTRAMUSCULAR | Status: AC
  Administered 2013-10-25: 4 mg via INTRAVENOUS
  Filled 2013-10-25: qty 2

## 2013-10-25 MED ORDER — ONDANSETRON HCL 4 MG PO TABS: 4.0000 mg | ORAL_TABLET | Freq: Four times a day (QID) | ORAL | Status: DC | PRN

## 2013-10-25 MED ORDER — ASPIRIN 81 MG PO CHEW
81.0000 mg | CHEWABLE_TABLET | Freq: Every morning | ORAL | Status: DC
  Administered 2013-10-25 – 2013-11-02 (×8): 81 mg via ORAL
  Filled 2013-10-25 (×9): qty 1

## 2013-10-25 MED ORDER — HYDROCODONE-ACETAMINOPHEN 7.5-325 MG PO TABS
1.0000 | ORAL_TABLET | ORAL | Status: DC | PRN
  Administered 2013-10-25: 1 via ORAL
  Administered 2013-10-25: 2 via ORAL
  Administered 2013-10-25: 1 via ORAL
  Filled 2013-10-25: qty 2
  Filled 2013-10-25 (×2): qty 1

## 2013-10-25 MED ORDER — INFLUENZA VAC SPLIT QUAD 0.5 ML IM SUSY
0.5000 mL | PREFILLED_SYRINGE | INTRAMUSCULAR | Status: AC
  Administered 2013-10-26: 0.5 mL via INTRAMUSCULAR
  Filled 2013-10-25: qty 0.5

## 2013-10-25 MED ORDER — DOCUSATE SODIUM 100 MG PO CAPS
100.0000 mg | ORAL_CAPSULE | Freq: Two times a day (BID) | ORAL | Status: DC
  Administered 2013-10-25 – 2013-11-02 (×15): 100 mg via ORAL
  Filled 2013-10-25 (×19): qty 1

## 2013-10-25 MED ORDER — OXYCODONE HCL 5 MG PO TABS
5.0000 mg | ORAL_TABLET | ORAL | Status: DC | PRN
  Administered 2013-10-26 (×2): 10 mg via ORAL
  Filled 2013-10-25 (×2): qty 2

## 2013-10-25 MED ORDER — ONDANSETRON HCL 4 MG/2ML IJ SOLN
4.0000 mg | Freq: Four times a day (QID) | INTRAMUSCULAR | Status: DC | PRN
  Administered 2013-10-27 – 2013-11-01 (×4): 4 mg via INTRAVENOUS
  Filled 2013-10-25 (×4): qty 2

## 2013-10-25 MED ORDER — ENOXAPARIN SODIUM 40 MG/0.4ML ~~LOC~~ SOLN
40.0000 mg | SUBCUTANEOUS | Status: AC
  Administered 2013-10-25: 40 mg via SUBCUTANEOUS
  Filled 2013-10-25: qty 0.4

## 2013-10-25 MED ORDER — DIAZEPAM 5 MG PO TABS
5.0000 mg | ORAL_TABLET | Freq: Three times a day (TID) | ORAL | Status: DC | PRN
  Administered 2013-10-26 (×2): 5 mg via ORAL
  Filled 2013-10-25 (×2): qty 1

## 2013-10-25 MED ORDER — VITAMIN D (ERGOCALCIFEROL) 1.25 MG (50000 UNIT) PO CAPS
50000.0000 [IU] | ORAL_CAPSULE | ORAL | Status: DC
  Administered 2013-10-30: 50000 [IU] via ORAL
  Filled 2013-10-25: qty 1

## 2013-10-25 MED ORDER — METOCLOPRAMIDE HCL 5 MG/ML IJ SOLN
5.0000 mg | Freq: Three times a day (TID) | INTRAMUSCULAR | Status: DC | PRN
  Administered 2013-10-25 – 2013-10-27 (×4): 10 mg via INTRAVENOUS
  Filled 2013-10-25 (×3): qty 2

## 2013-10-25 MED ORDER — HYDROMORPHONE HCL 1 MG/ML IJ SOLN
0.5000 mg | INTRAMUSCULAR | Status: DC | PRN
  Administered 2013-10-25 (×2): 2 mg via INTRAVENOUS
  Filled 2013-10-25 (×2): qty 2

## 2013-10-25 MED ORDER — PANTOPRAZOLE SODIUM 40 MG PO TBEC
40.0000 mg | DELAYED_RELEASE_TABLET | Freq: Every day | ORAL | Status: DC
  Administered 2013-10-25 – 2013-11-02 (×8): 40 mg via ORAL
  Filled 2013-10-25 (×6): qty 1

## 2013-10-25 MED ORDER — CEFAZOLIN SODIUM-DEXTROSE 2-3 GM-% IV SOLR
2.0000 g | Freq: Four times a day (QID) | INTRAVENOUS | Status: AC
  Administered 2013-10-25 (×3): 2 g via INTRAVENOUS
  Filled 2013-10-25 (×3): qty 50

## 2013-10-25 NOTE — Progress Notes (Signed)
Per patient's nurse, Daune Perch, CT is routine to be done after shift change Sunday morning. (After 0730)

## 2013-10-25 NOTE — Progress Notes (Signed)
Around the patient's upper pin sites (near knee), moderate sanguinous drainage. Notified on call MD/PA and was told to reinforce with 4x4s and kerlix. Was unable to use kerlix to secure 4x4 gauze so secured with tape. Elevated leg and applied ice. Will continue to monitor any additional drainage.

## 2013-10-25 NOTE — Anesthesia Postprocedure Evaluation (Signed)
Anesthesia Post Note  Patient: Kevin Kim  Procedure(s) Performed: Procedure(s) (LRB): IRRIGATION AND DEBRIDEMENT OPEN RIGHT TIBIA/FIBULA FRACTURE (Right) EXTERNAL FIXATION RIGHT LOWER LEG (Right) CLOSED REDUCTION TIBIA/FIBULA FRACTURE (Right)  Anesthesia type: general  Patient location: PACU  Post pain: Pain level controlled  Post assessment: Patient's Cardiovascular Status Stable  Last Vitals:  Filed Vitals:   10/25/13 1322  BP: 113/47  Pulse: 83  Temp: 36.9 C  Resp: 18    Post vital signs: Reviewed and stable  Level of consciousness: sedated  Complications: No apparent anesthesia complications

## 2013-10-25 NOTE — Progress Notes (Signed)
Patient ID: Kevin Kim, male   DOB: Apr 26, 1945, 68 y.o.   MRN: [id number] Subjective: 1 Day Post-Op Procedure(s) (LRB): IRRIGATION AND DEBRIDEMENT OPEN RIGHT TIBIA/FIBULA FRACTURE (Right) EXTERNAL FIXATION RIGHT LOWER LEG (Right) CLOSED REDUCTION TIBIA/FIBULA FRACTURE (Right)    Patient reports pain as mild.  Does report some numbness in foot this am, reviewed possible etiologies as related to the injury.  Reviewed surgery performed and reasoning  Objective:   VITALS:   Filed Vitals:   10/25/13 0506  BP: 101/50  Pulse: 72  Temp: 97.8 F (36.6 C)  Resp: 16    Intact pulses distally Feels pressure in foot and toes Limited foot and toes movement  LABS  Recent Labs  10/24/13 1640  HGB 14.6  HCT 41.6  WBC 9.1  PLT 207     Recent Labs  10/24/13 1640 10/25/13 0425  NA 137 138  K 4.1 4.6  BUN 17 13  CREATININE 0.91 0.77  GLUCOSE 124* 149*    No results found for this basename: LABPT, INR,  in the last 72 hours   Assessment/Plan: 1 Day Post-Op Procedure(s) (LRB): IRRIGATION AND DEBRIDEMENT OPEN RIGHT TIBIA/FIBULA FRACTURE (Right) EXTERNAL FIXATION RIGHT LOWER LEG (Right) CLOSED REDUCTION TIBIA/FIBULA FRACTURE (Right)  Comminuted distal tibial fracture with apparent extension to the plafond Ordered a CT scan to better evaluate fracture  Will consult Dr. Marcelino Scot or Dr. Doran Durand for assistance in defintive management of this injury Unless direct trauma from injury I would not anticipate long term sequela as it pertains to his foot sensibility currently, I expect it to return to normal  Will make NPO after midnight in case Handy able to take him to OR tomorrow  Reviewed with he and his daughter

## 2013-10-26 ENCOUNTER — Inpatient Hospital Stay (HOSPITAL_COMMUNITY): Payer: Medicare Other

## 2013-10-26 DIAGNOSIS — S82871B Displaced pilon fracture of right tibia, initial encounter for open fracture type I or II: Secondary | ICD-10-CM | POA: Diagnosis not present

## 2013-10-26 LAB — CBC
HCT: 31.7 % — ABNORMAL LOW (ref 39.0–52.0)
Hemoglobin: 10.4 g/dL — ABNORMAL LOW (ref 13.0–17.0)
MCH: 31.9 pg (ref 26.0–34.0)
MCHC: 32.8 g/dL (ref 30.0–36.0)
MCV: 97.2 fL (ref 78.0–100.0)
PLATELETS: 167 10*3/uL (ref 150–400)
RBC: 3.26 MIL/uL — AB (ref 4.22–5.81)
RDW: 12.8 % (ref 11.5–15.5)
WBC: 9.4 10*3/uL (ref 4.0–10.5)

## 2013-10-26 LAB — MRSA PCR SCREENING: MRSA by PCR: NEGATIVE

## 2013-10-26 MED ORDER — METHOCARBAMOL 1000 MG/10ML IJ SOLN
1000.0000 mg | Freq: Four times a day (QID) | INTRAVENOUS | Status: DC
  Administered 2013-10-26 – 2013-11-01 (×22): 1000 mg via INTRAVENOUS
  Filled 2013-10-26 (×30): qty 10

## 2013-10-26 MED ORDER — OXYCODONE-ACETAMINOPHEN 5-325 MG PO TABS
1.0000 | ORAL_TABLET | Freq: Four times a day (QID) | ORAL | Status: DC | PRN
  Administered 2013-10-26 – 2013-10-27 (×3): 2 via ORAL
  Administered 2013-10-30: 1 via ORAL
  Administered 2013-10-31 – 2013-11-02 (×4): 2 via ORAL
  Filled 2013-10-26 (×4): qty 2
  Filled 2013-10-26: qty 1
  Filled 2013-10-26 (×5): qty 2

## 2013-10-26 MED ORDER — OXYCODONE HCL 5 MG PO TABS
5.0000 mg | ORAL_TABLET | ORAL | Status: DC | PRN
  Administered 2013-10-26: 10 mg via ORAL
  Administered 2013-10-26: 15 mg via ORAL
  Administered 2013-10-26: 10 mg via ORAL
  Administered 2013-10-27: 5 mg via ORAL
  Administered 2013-10-27 – 2013-10-30 (×13): 10 mg via ORAL
  Administered 2013-10-30: 15 mg via ORAL
  Administered 2013-10-31 – 2013-11-02 (×7): 10 mg via ORAL
  Filled 2013-10-26 (×16): qty 2
  Filled 2013-10-26: qty 3
  Filled 2013-10-26 (×4): qty 2
  Filled 2013-10-26: qty 3
  Filled 2013-10-26 (×2): qty 2

## 2013-10-26 MED ORDER — HYDROXYZINE HCL 25 MG PO TABS
50.0000 mg | ORAL_TABLET | Freq: Three times a day (TID) | ORAL | Status: DC | PRN
  Administered 2013-10-28 – 2013-10-29 (×2): 50 mg via ORAL
  Filled 2013-10-26 (×2): qty 2

## 2013-10-26 MED ORDER — PREGABALIN 75 MG PO CAPS
75.0000 mg | ORAL_CAPSULE | Freq: Two times a day (BID) | ORAL | Status: DC
Start: 2013-10-26 — End: 2013-11-02
  Administered 2013-10-26 – 2013-11-02 (×14): 75 mg via ORAL
  Filled 2013-10-26 (×14): qty 1

## 2013-10-26 MED ORDER — CEFAZOLIN SODIUM-DEXTROSE 2-3 GM-% IV SOLR
2.0000 g | Freq: Once | INTRAVENOUS | Status: AC
  Administered 2013-10-27: 2 g via INTRAVENOUS
  Filled 2013-10-26: qty 50

## 2013-10-26 MED ORDER — HYDROMORPHONE HCL 1 MG/ML IJ SOLN
0.5000 mg | INTRAMUSCULAR | Status: DC | PRN
  Administered 2013-10-26 – 2013-10-27 (×4): 1 mg via INTRAVENOUS
  Administered 2013-10-28: 0.5 mg via INTRAVENOUS
  Administered 2013-10-28: 1 mg via INTRAVENOUS
  Filled 2013-10-26 (×6): qty 1

## 2013-10-26 NOTE — Progress Notes (Signed)
Patient ID: Kevin Kim, male   DOB: 1945/02/12, 68 y.o.   MRN: [id number] Subjective: 2 Days Post-Op Procedure(s) (LRB): IRRIGATION AND DEBRIDEMENT OPEN RIGHT TIBIA/FIBULA FRACTURE (Right) EXTERNAL FIXATION RIGHT LOWER LEG (Right) CLOSED REDUCTION TIBIA/FIBULA FRACTURE (Right)    Patient reports pain as moderate.  As he recognized popliteal block wearing off he has had more throbbing pain in right leg/ankle area.  Otherwise no events.  Difficulty urinating in bed  Objective:   VITALS:   Filed Vitals:   10/26/13 0609  BP: 122/58  Pulse: 75  Temp: 97.9 F (36.6 C)  Resp: 16    Neurologically intact Ex-fix dressings intact  LABS  Recent Labs  10/24/13 1640  HGB 14.6  HCT 41.6  WBC 9.1  PLT 207     Recent Labs  10/24/13 1640 10/25/13 0425  NA 137 138  K 4.1 4.6  BUN 17 13  CREATININE 0.91 0.77  GLUCOSE 124* 149*    No results found for this basename: LABPT, INR,  in the last 72 hours   Assessment/Plan: 2 Days Post-Op Procedure(s) (LRB): IRRIGATION AND DEBRIDEMENT OPEN RIGHT TIBIA/FIBULA FRACTURE (Right) EXTERNAL FIXATION RIGHT LOWER LEG (Right) CLOSED REDUCTION TIBIA/FIBULA FRACTURE (Right)   {Plan: CT scan done Reviewed case with Dr. Marcelino Scot who intends at this point to evaluate studies and take to OR tomorrow for repeat I&D of wound, and hopeful definitive ORIF Regular diet today NPO after midnight  Reviewed this plan with patient and his daughter Will re-look at his pain meds to see if we can help make him a bit more comfortable, told him to ask for IV dilaudid as needed for breakthrough pain

## 2013-10-26 NOTE — Consult Note (Signed)
[facility] Consult  Pt seen and evaluated  Full consult dictated  Dictation #:[id number]    A/P:  68 y/o male s/p fall off ladder with open severely comminuted R pilon fracture and comminuted R fibula fracture  1. Fall off ladder  2. Open comminuted R pilon and fibula fxs s/p I&D and ex fix   Return to OR tomorrow for repeat I&D, ex fix revision   Suspect swelling will be too significant for ORIF tomorrow   Also need time for open wound to stabilize    Aggressive ice and elevation    Anticipate delayed fixation in 10-14 days  3. Pain management:  Adjust pain meds accordingly   4. ABL anemia/Hemodynamics  Stable  5. DVT/PE prophylaxis:  lovenox  6. ID:   Completed abx for open fx treatment    7. Activity:  NWB R leg  Ok to get up with assist  8. FEN/Foley/Lines:  Reg diet as tolerated  Npo after mn  9.Ex-fix/Splint care:  Ice and elevate extremity above heart    10. Dispo:  Return to OR tomorrow for I&D and ex fix revision  Would not anticipate definitive fixation tomorrow, this will likely occur on a delayed basis   Jari Pigg, PA-C [facility] [phone] (P) 10/26/2013 9:27 AM

## 2013-10-26 NOTE — Consult Note (Signed)
Kevin Kim, Kevin Kim            ACCOUNT NO.:  [id number]  MEDICAL RECORD NO.:  [id number]  LOCATION:  5N21C                        FACILITY:  [facility]  PHYSICIAN:  Astrid Divine. Marcelino Scot, M.D. DATE OF BIRTH:  1945/12/02  DATE OF CONSULTATION:  10/26/2013 DATE OF DISCHARGE:                                CONSULTATION   Fall with a severely comminuted open right distal tibia and fibular fractures.  REFERRING PHYSICIAN:  Pietro Cassis. Alvan Dame, MD  BRIEF HISTORY OF PRESENT ILLNESS:  Kevin Kim is a very pleasant 68- year-old white male, who was helping his son with house work on Saturday, October 24, 2013.  The patient was up on an A-Frame ladder when it gave away.  The patient jumped off the ladder.  He landed on his feet.  Had immediate onset of pain and inability to bear weight on his right leg.  The patient was brought to [facility] where he was found to have a severely comminuted open right distal tibia and fibula fracture.  The patient was seen and evaluated by Dr. Alvan Dame who was on- call.  He was taken urgently to the operating room for irrigation and debridement of his open wound as well as placement of an external fixator.  The patient did have a popliteal block preoperatively as well. Due to this severe injury sustained to the right leg, Orthopedic Trauma Service consult was requested as it was felt the patient needed fellowship trained orthopedic traumatologist for definitive treatment. The patient was seen today on October 26, 2013, in room [place].  The patient states that he is in a pretty significant pain to his right leg. His block did wear off yesterday and has had difficulty getting some pain control since then.  He denies any additional injuries.  He denies any numbness or tingling right nail to his right foot.  Pain is exacerbated with movement and relieved with rest and pain medications, although does not go away completely.  Denies any chest pain, shortness of  breath.  No abdominal pain.  No headaches or lightheadedness.  Again, no other issues noted.  The patient does live alone but his children are in the immediate area.  PAST MEDICAL HISTORY:  Notable for GERD and had an irregular heartbeat about 10 years ago.  This was worked up with cardiac cath which did not yield any pathology.  No other issues pertaining to this since then.  SURGICAL HISTORY:  Hernia repair.  FAMILY HISTORY:  Noncontributory.  SOCIAL HISTORY:  The patient does not smoke, does not drink.  MEDICATIONS:  Prior to admission include aspirin, Prevacid, multivitamin, and vitamin D2.  ALLERGIES:  ERYTHROMYCIN and PEANUT.  PHYSICAL EXAMINATION:  VITAL SIGNS:  Temperature 97.9, heart rate 75, respiratory rate 18, 93% on room air, BP is 122/58. GENERAL:  The patient is awake, alert, in no acute distress, very pleasant, appropriate for stated age. HEENT:  Head is atraumatic.  Extraocular muscles are intact.  Moist mucous membranes are noted. NECK:  No spinous process tenderness.  Full active and passive range of motion. LUNGS AND CHEST:  Clear to auscultation bilaterally. CARDIAC:  S1 and S2 noted.  Regular rate and rhythm. ABDOMEN:  Soft,  nontender.  Positive bowel sounds. EXTREMITIES:  Focused examination of his right lower extremity hip is unremarkable.  The patient is in an Delta frame ex fix.  Two pins in the proximal tibia and 1 trans calcaneal pin.  A soft dressing under and over the ex fix is noted.  The foot is nontender to palpation.  Knee is nontender to palpation as well.  Hip is nontender.  There is extensive swelling noted to his foot.  No swelling or open wounds noted in the proximal tibia.  I did not remove dressing from his ankle at this time. Range of motion of the ankle not assessed as this patient is on an ex fix.  Knee range of motion not assessed either.  Did not assess knee stability at this time given fracture to his right ankle.  We can  re- evaluate his knee in the OR.  DPN, SPN, and TN sensory functions grossly intact.  EHL, FHL, and lesser toe motor functions are grossly intact. Extremities warm, palpable dorsalis pedis pulses noted.  No pain with passive stretching of his compartments of the lower leg as well.  The patient does have a medial-sided wound.  Again, this will be re- addressed in the OR.  IMAGING:  CT scan of his right ankle demonstrates a severely comminuted intra-articular right pilon fracture with extensive comminution of the joint and metaphysis.  There is also a highly comminuted fibular fracture as well.  No injury identified to the talus, calcaneus, or midfoot based on the CAT scan.  ASSESSMENT AND PLAN:  A 68 year old male status post fall off ladder with open comminuted right pilon fracture and open right fibula fracture. 1. Fall off ladder. 2. Open comminuted right pilon and fibula fracture status post     incision and drainage and external fixator. 3. Return to the OR tomorrow for repeat incision and drainage and     external fixator revision.  I suspect that his soft tissue swelling     will be too severe to proceed with definitive ORIF tomorrow and     will also need time for his open wound to stabilizes to not     compromise his skin bridge.  Continue with aggressive ice and     elevation.  I would anticipate delayed fixation in 10-14 days. 4. Pain management.  Adjust the medications accordingly. 5. Acute blood loss anemia.  Hemodynamically stable. 6. Deep vein thrombosis and pulmonary embolus prophylaxis.  Continue     Lovenox. 7. ID.  The patient has completed antibiotics for open fracture     treatment. 8. Activity nonweightbearing right leg.  Okay to get up with assist. 9. FEN/Foley/line, regular diet as tolerated.  N.p.o. after midnight. 10.Ex-fix and physical care.  Okay to lift extremity by external     fixator.  Continue ice and elevate extremity above the level of the      heart. 11.Disposition.  Return to the OR tomorrow for I and D and ex-fix     revision.  Again, I would not anticipate definitive fixation     tomorrow.  This will likely occur on delayed basis.     Jari Pigg, PA   ______________________________ Astrid Divine. Marcelino Scot, M.D.    KWP/MEDQ  D:  10/26/2013  T:  10/26/2013  Job:  [id number]

## 2013-10-27 ENCOUNTER — Inpatient Hospital Stay (HOSPITAL_COMMUNITY): Payer: Medicare Other | Admitting: Anesthesiology

## 2013-10-27 ENCOUNTER — Inpatient Hospital Stay (HOSPITAL_COMMUNITY): Payer: Medicare Other

## 2013-10-27 ENCOUNTER — Encounter (HOSPITAL_COMMUNITY): Payer: Medicare Other | Admitting: Anesthesiology

## 2013-10-27 ENCOUNTER — Encounter (HOSPITAL_COMMUNITY)
Admission: EM | Disposition: A | Discharge status: Skilled Nursing Facility | Payer: Self-pay | Source: Home / Self Care | Attending: Orthopedic Surgery

## 2013-10-27 HISTORY — PX: EXTERNAL FIXATION LEG: SHX1549

## 2013-10-27 LAB — CBC
HCT: 31.1 % — ABNORMAL LOW (ref 39.0–52.0)
HEMATOCRIT: 33.4 % — AB (ref 39.0–52.0)
Hemoglobin: 11.3 g/dL — ABNORMAL LOW (ref 13.0–17.0)
Hemoglobin: 10.6 g/dL — ABNORMAL LOW (ref 13.0–17.0)
MCH: 33.1 pg (ref 26.0–34.0)
MCH: 32.4 pg (ref 26.0–34.0)
MCHC: 33.8 g/dL (ref 30.0–36.0)
MCHC: 34.1 g/dL (ref 30.0–36.0)
MCV: 97.9 fL (ref 78.0–100.0)
MCV: 95.1 fL (ref 78.0–100.0)
PLATELETS: 167 10*3/uL (ref 150–400)
PLATELETS: 162 10*3/uL (ref 150–400)
RBC: 3.41 MIL/uL — ABNORMAL LOW (ref 4.22–5.81)
RBC: 3.27 MIL/uL — ABNORMAL LOW (ref 4.22–5.81)
RDW: 12.4 % (ref 11.5–15.5)
RDW: 12.2 % (ref 11.5–15.5)
WBC: 9.8 10*3/uL (ref 4.0–10.5)
WBC: 8.5 10*3/uL (ref 4.0–10.5)

## 2013-10-27 LAB — URINALYSIS, ROUTINE W REFLEX MICROSCOPIC
BILIRUBIN URINE: NEGATIVE
GLUCOSE, UA: NEGATIVE mg/dL
HGB URINE DIPSTICK: NEGATIVE
Ketones, ur: NEGATIVE mg/dL
Leukocytes, UA: NEGATIVE
Nitrite: NEGATIVE
Protein, ur: NEGATIVE mg/dL
Specific Gravity, Urine: 1.008 (ref 1.005–1.030)
UROBILINOGEN UA: 0.2 mg/dL (ref 0.0–1.0)
pH: 6.5 (ref 5.0–8.0)

## 2013-10-27 LAB — COMPREHENSIVE METABOLIC PANEL
ALBUMIN: 3.2 g/dL — AB (ref 3.5–5.2)
ALT: 15 U/L (ref 0–53)
ANION GAP: 10 (ref 5–15)
AST: 26 U/L (ref 0–37)
Alkaline Phosphatase: 50 U/L (ref 39–117)
BUN: 11 mg/dL (ref 6–23)
CALCIUM: 8.6 mg/dL (ref 8.4–10.5)
CO2: 29 mEq/L (ref 19–32)
CREATININE: 0.79 mg/dL (ref 0.50–1.35)
Chloride: 98 mEq/L (ref 96–112)
GFR calc Af Amer: >90 mL/min (ref >90)
GFR calc non Af Amer: >90 mL/min (ref >90)
Glucose, Bld: 122 mg/dL — ABNORMAL HIGH (ref 70–99)
Potassium: 4.5 mEq/L (ref 3.7–5.3)
Sodium: 137 mEq/L (ref 137–147)
Total Bilirubin: 1.3 mg/dL — ABNORMAL HIGH (ref 0.3–1.2)
Total Protein: 6.2 g/dL (ref 6.0–8.3)

## 2013-10-27 LAB — CREATININE, SERUM
Creatinine, Ser: 0.76 mg/dL (ref 0.50–1.35)
GFR calc Af Amer: >90 mL/min (ref >90)
GFR calc non Af Amer: >90 mL/min (ref >90)

## 2013-10-27 LAB — VITAMIN D 25 HYDROXY (VIT D DEFICIENCY, FRACTURES): Vit D, 25-Hydroxy: 43 ng/mL (ref 30–89)

## 2013-10-27 SURGERY — EXTERNAL FIXATION, LOWER EXTREMITY
Anesthesia: General | Site: Leg Lower | Laterality: Right

## 2013-10-27 MED ORDER — OXYCODONE HCL 5 MG PO TABS
5.0000 mg | ORAL_TABLET | Freq: Once | ORAL | Status: AC | PRN
  Administered 2013-10-27: 5 mg via ORAL

## 2013-10-27 MED ORDER — EPHEDRINE SULFATE 50 MG/ML IJ SOLN
INTRAMUSCULAR | Status: DC | PRN
  Administered 2013-10-27: 10 mg via INTRAVENOUS

## 2013-10-27 MED ORDER — MIDAZOLAM HCL 2 MG/2ML IJ SOLN
INTRAMUSCULAR | Status: AC
  Filled 2013-10-27: qty 2

## 2013-10-27 MED ORDER — BISACODYL 5 MG PO TBEC: 5.0000 mg | DELAYED_RELEASE_TABLET | Freq: Every day | ORAL | Status: DC | PRN

## 2013-10-27 MED ORDER — METOCLOPRAMIDE HCL 5 MG/ML IJ SOLN: 5.0000 mg | Freq: Three times a day (TID) | INTRAMUSCULAR | Status: DC | PRN

## 2013-10-27 MED ORDER — METOCLOPRAMIDE HCL 10 MG PO TABS: 5.0000 mg | ORAL_TABLET | Freq: Three times a day (TID) | ORAL | Status: DC | PRN

## 2013-10-27 MED ORDER — ROCURONIUM BROMIDE 100 MG/10ML IV SOLN
INTRAVENOUS | Status: DC | PRN
  Administered 2013-10-27: 20 mg via INTRAVENOUS
  Administered 2013-10-27: 10 mg via INTRAVENOUS

## 2013-10-27 MED ORDER — ONDANSETRON HCL 4 MG/2ML IJ SOLN: 4.0000 mg | Freq: Four times a day (QID) | INTRAMUSCULAR | Status: DC | PRN

## 2013-10-27 MED ORDER — LIDOCAINE HCL (CARDIAC) 20 MG/ML IV SOLN
INTRAVENOUS | Status: DC | PRN
  Administered 2013-10-27: 90 mg via INTRAVENOUS

## 2013-10-27 MED ORDER — PROMETHAZINE HCL 25 MG/ML IJ SOLN: 6.2500 mg | INTRAMUSCULAR | Status: DC | PRN

## 2013-10-27 MED ORDER — NEOSTIGMINE METHYLSULFATE 10 MG/10ML IV SOLN
INTRAVENOUS | Status: AC
  Filled 2013-10-27: qty 1

## 2013-10-27 MED ORDER — GLYCOPYRROLATE 0.2 MG/ML IJ SOLN
INTRAMUSCULAR | Status: AC
  Filled 2013-10-27: qty 3

## 2013-10-27 MED ORDER — HYDROMORPHONE HCL 1 MG/ML IJ SOLN
0.2500 mg | INTRAMUSCULAR | Status: DC | PRN
  Administered 2013-10-27 (×4): 0.5 mg via INTRAVENOUS

## 2013-10-27 MED ORDER — ONDANSETRON HCL 4 MG PO TABS: 4.0000 mg | ORAL_TABLET | Freq: Four times a day (QID) | ORAL | Status: DC | PRN

## 2013-10-27 MED ORDER — SUCCINYLCHOLINE CHLORIDE 20 MG/ML IJ SOLN
INTRAMUSCULAR | Status: DC | PRN
  Administered 2013-10-27: 110 mg via INTRAVENOUS

## 2013-10-27 MED ORDER — ONDANSETRON HCL 4 MG/2ML IJ SOLN
INTRAMUSCULAR | Status: DC | PRN
  Administered 2013-10-27: 4 mg via INTRAVENOUS

## 2013-10-27 MED ORDER — POTASSIUM CHLORIDE IN NACL 20-0.9 MEQ/L-% IV SOLN
INTRAVENOUS | Status: DC
  Filled 2013-10-27 (×9): qty 1000

## 2013-10-27 MED ORDER — OXYCODONE HCL 5 MG/5ML PO SOLN: 5.0000 mg | Freq: Once | ORAL | Status: AC | PRN

## 2013-10-27 MED ORDER — LIDOCAINE HCL 2 % EX GEL
CUTANEOUS | Status: AC
  Filled 2013-10-27: qty 20

## 2013-10-27 MED ORDER — ARTIFICIAL TEARS OP OINT
TOPICAL_OINTMENT | OPHTHALMIC | Status: DC | PRN
  Administered 2013-10-27: 1 via OPHTHALMIC

## 2013-10-27 MED ORDER — LIDOCAINE HCL (CARDIAC) 20 MG/ML IV SOLN
INTRAVENOUS | Status: AC
  Filled 2013-10-27: qty 5

## 2013-10-27 MED ORDER — MIDAZOLAM HCL 5 MG/5ML IJ SOLN
INTRAMUSCULAR | Status: DC | PRN
  Administered 2013-10-27: 2 mg via INTRAVENOUS

## 2013-10-27 MED ORDER — PHENYLEPHRINE HCL 10 MG/ML IJ SOLN
INTRAMUSCULAR | Status: DC | PRN
  Administered 2013-10-27 (×2): 40 ug via INTRAVENOUS

## 2013-10-27 MED ORDER — HYDROMORPHONE HCL 1 MG/ML IJ SOLN
INTRAMUSCULAR | Status: AC
  Filled 2013-10-27: qty 1

## 2013-10-27 MED ORDER — ENOXAPARIN SODIUM 40 MG/0.4ML ~~LOC~~ SOLN
40.0000 mg | SUBCUTANEOUS | Status: DC
  Administered 2013-10-28 – 2013-11-02 (×6): 40 mg via SUBCUTANEOUS
  Filled 2013-10-27 (×8): qty 0.4

## 2013-10-27 MED ORDER — PHENYLEPHRINE HCL 10 MG/ML IJ SOLN
10.0000 mg | INTRAVENOUS | Status: DC | PRN
  Administered 2013-10-27: 10 ug/min via INTRAVENOUS

## 2013-10-27 MED ORDER — GLYCOPYRROLATE 0.2 MG/ML IJ SOLN
INTRAMUSCULAR | Status: DC | PRN
  Administered 2013-10-27: 0.4 mg via INTRAVENOUS

## 2013-10-27 MED ORDER — ROCURONIUM BROMIDE 50 MG/5ML IV SOLN
INTRAVENOUS | Status: AC
  Filled 2013-10-27: qty 1

## 2013-10-27 MED ORDER — LACTATED RINGERS IV SOLN
INTRAVENOUS | Status: DC | PRN
  Administered 2013-10-27 (×2): via INTRAVENOUS

## 2013-10-27 MED ORDER — OXYCODONE HCL 5 MG PO TABS
ORAL_TABLET | ORAL | Status: AC
  Administered 2013-10-27: 14:00:00
  Filled 2013-10-27: qty 1

## 2013-10-27 MED ORDER — FENTANYL CITRATE 0.05 MG/ML IJ SOLN
INTRAMUSCULAR | Status: AC
  Filled 2013-10-27: qty 5

## 2013-10-27 MED ORDER — MAGNESIUM CITRATE PO SOLN: 1.0000 | Freq: Once | ORAL | Status: AC | PRN

## 2013-10-27 MED ORDER — OXYCODONE HCL 5 MG PO TABS
ORAL_TABLET | ORAL | Status: AC
  Filled 2013-10-27: qty 1

## 2013-10-27 MED ORDER — FENTANYL CITRATE 0.05 MG/ML IJ SOLN
INTRAMUSCULAR | Status: DC | PRN
  Administered 2013-10-27: 50 ug via INTRAVENOUS
  Administered 2013-10-27: 100 ug via INTRAVENOUS
  Administered 2013-10-27 (×2): 50 ug via INTRAVENOUS
  Administered 2013-10-27: 100 ug via INTRAVENOUS
  Administered 2013-10-27: 25 ug via INTRAVENOUS

## 2013-10-27 MED ORDER — SUCCINYLCHOLINE CHLORIDE 20 MG/ML IJ SOLN
INTRAMUSCULAR | Status: AC
  Filled 2013-10-27: qty 1

## 2013-10-27 MED ORDER — TAMSULOSIN HCL 0.4 MG PO CAPS
0.4000 mg | ORAL_CAPSULE | Freq: Every day | ORAL | Status: DC
  Administered 2013-10-27 – 2013-11-01 (×6): 0.4 mg via ORAL
  Filled 2013-10-27 (×7): qty 1

## 2013-10-27 MED ORDER — CEFAZOLIN SODIUM 1-5 GM-% IV SOLN
1.0000 g | Freq: Four times a day (QID) | INTRAVENOUS | Status: AC
  Administered 2013-10-27 – 2013-10-28 (×3): 1 g via INTRAVENOUS
  Filled 2013-10-27 (×3): qty 50

## 2013-10-27 MED ORDER — PROPOFOL 10 MG/ML IV BOLUS
INTRAVENOUS | Status: AC
  Filled 2013-10-27: qty 20

## 2013-10-27 MED ORDER — DIPHENHYDRAMINE HCL 12.5 MG/5ML PO ELIX: 12.5000 mg | ORAL_SOLUTION | ORAL | Status: DC | PRN

## 2013-10-27 MED ORDER — ONDANSETRON HCL 4 MG/2ML IJ SOLN
INTRAMUSCULAR | Status: AC
  Filled 2013-10-27: qty 2

## 2013-10-27 MED ORDER — PROPOFOL 10 MG/ML IV BOLUS
INTRAVENOUS | Status: DC | PRN
  Administered 2013-10-27: 150 mg via INTRAVENOUS

## 2013-10-27 MED ORDER — NEOSTIGMINE METHYLSULFATE 10 MG/10ML IV SOLN
INTRAVENOUS | Status: DC | PRN
  Administered 2013-10-27: 3 mg via INTRAVENOUS

## 2013-10-27 SURGICAL SUPPLY — 86 items
11 x 150 bar ×2 IMPLANT
3x100x15 pin ×2 IMPLANT
3x100x25 ×2 IMPLANT
BANDAGE ELASTIC 3 VELCRO ST LF (GAUZE/BANDAGES/DRESSINGS) ×2 IMPLANT
BANDAGE ELASTIC 4 VELCRO ST LF (GAUZE/BANDAGES/DRESSINGS) ×3 IMPLANT
BANDAGE ELASTIC 6 VELCRO ST LF (GAUZE/BANDAGES/DRESSINGS) ×3 IMPLANT
BANDAGE ESMARK 6X9 LF (GAUZE/BANDAGES/DRESSINGS) ×1 IMPLANT
BAR EXFX 150X11 NS LF (EXFIX) ×1
BAR GLASS FIBER EXFX 11X150 (EXFIX) ×1 IMPLANT
BIT DRILL 2.9 CANN QC NONSTRL (BIT) ×2 IMPLANT
BNDG CMPR 9X6 STRL LF SNTH (GAUZE/BANDAGES/DRESSINGS) ×1
BNDG CMPR MD 5X2 ELC HKLP STRL (GAUZE/BANDAGES/DRESSINGS) ×1
BNDG COHESIVE 6X5 TAN STRL LF (GAUZE/BANDAGES/DRESSINGS) ×3 IMPLANT
BNDG ELASTIC 2 VLCR STRL LF (GAUZE/BANDAGES/DRESSINGS) ×2 IMPLANT
BNDG ESMARK 6X9 LF (GAUZE/BANDAGES/DRESSINGS) ×3
BNDG GAUZE ELAST 4 BULKY (GAUZE/BANDAGES/DRESSINGS) ×6 IMPLANT
BRUSH SCRUB DISP (MISCELLANEOUS) ×6 IMPLANT
CLAMP BLUE BAR TO BAR (MISCELLANEOUS) ×4 IMPLANT
CLAMP BLUE BAR TO PIN (MISCELLANEOUS) ×2 IMPLANT
CLEANER TIP ELECTROSURG 2X2 (MISCELLANEOUS) ×3 IMPLANT
CLOSURE WOUND 1/2 X4 (GAUZE/BANDAGES/DRESSINGS)
COVER SURGICAL LIGHT HANDLE (MISCELLANEOUS) ×6 IMPLANT
CUFF TOURNIQUET SINGLE 18IN (TOURNIQUET CUFF) IMPLANT
CUFF TOURNIQUET SINGLE 24IN (TOURNIQUET CUFF) IMPLANT
CUFF TOURNIQUET SINGLE 34IN LL (TOURNIQUET CUFF) IMPLANT
DRAPE C-ARM 42X72 X-RAY (DRAPES) IMPLANT
DRAPE C-ARMOR (DRAPES) ×3 IMPLANT
DRAPE U-SHAPE 47X51 STRL (DRAPES) ×3 IMPLANT
DRSG ADAPTIC 3X8 NADH LF (GAUZE/BANDAGES/DRESSINGS) ×3 IMPLANT
DRSG MEPITEL 4X7.2 (GAUZE/BANDAGES/DRESSINGS) ×2 IMPLANT
ELECT REM PT RETURN 9FT ADLT (ELECTROSURGICAL) ×3
ELECTRODE REM PT RTRN 9FT ADLT (ELECTROSURGICAL) ×1 IMPLANT
EVACUATOR 1/8 PVC DRAIN (DRAIN) IMPLANT
GAUZE SPONGE 4X4 12PLY STRL (GAUZE/BANDAGES/DRESSINGS) ×3 IMPLANT
GLOVE BIO SURGEON STRL SZ7.5 (GLOVE) ×3 IMPLANT
GLOVE BIO SURGEON STRL SZ8 (GLOVE) ×3 IMPLANT
GLOVE BIOGEL PI IND STRL 7.5 (GLOVE) ×1 IMPLANT
GLOVE BIOGEL PI IND STRL 8 (GLOVE) ×1 IMPLANT
GLOVE BIOGEL PI INDICATOR 7.5 (GLOVE) ×2
GLOVE BIOGEL PI INDICATOR 8 (GLOVE) ×2
GOWN STRL REUS W/ TWL LRG LVL3 (GOWN DISPOSABLE) ×2 IMPLANT
GOWN STRL REUS W/ TWL XL LVL3 (GOWN DISPOSABLE) ×1 IMPLANT
GOWN STRL REUS W/TWL LRG LVL3 (GOWN DISPOSABLE) ×6
GOWN STRL REUS W/TWL XL LVL3 (GOWN DISPOSABLE) ×3
GUIDEWIRE ORTH 6X062XTROC NS (WIRE) IMPLANT
HALF PIN 3MM (PIN) ×1 IMPLANT
HANDPIECE INTERPULSE COAX TIP (DISPOSABLE)
K-WIRE .062 (WIRE) ×6
KIT BASIN OR (CUSTOM PROCEDURE TRAY) ×3 IMPLANT
KIT ROOM TURNOVER OR (KITS) ×3 IMPLANT
MANIFOLD NEPTUNE II (INSTRUMENTS) ×3 IMPLANT
NEEDLE 22X1 1/2 (OR ONLY) (NEEDLE) IMPLANT
NS IRRIG 1000ML POUR BTL (IV SOLUTION) ×3 IMPLANT
PACK ORTHO EXTREMITY (CUSTOM PROCEDURE TRAY) ×3 IMPLANT
PAD ARMBOARD 7.5X6 YLW CONV (MISCELLANEOUS) ×6 IMPLANT
PAD NEG PRESSURE SENSATRAC (MISCELLANEOUS) ×2 IMPLANT
PADDING CAST COTTON 6X4 STRL (CAST SUPPLIES) ×9 IMPLANT
PIN 3MM (PIN) ×1 IMPLANT
SCREW ACE CAN 4.0 46M (Screw) ×2 IMPLANT
SCREW ACE CAN 4.0 50M (Screw) ×4 IMPLANT
SET HNDPC FAN SPRY TIP SCT (DISPOSABLE) IMPLANT
SPONGE GAUZE 4X4 12PLY STER LF (GAUZE/BANDAGES/DRESSINGS) ×2 IMPLANT
SPONGE LAP 18X18 X RAY DECT (DISPOSABLE) ×5 IMPLANT
SPONGE SCRUB IODOPHOR (GAUZE/BANDAGES/DRESSINGS) ×3 IMPLANT
STAPLER VISISTAT 35W (STAPLE) IMPLANT
STOCKINETTE IMPERVIOUS LG (DRAPES) ×3 IMPLANT
STRIP CLOSURE SKIN 1/2X4 (GAUZE/BANDAGES/DRESSINGS) IMPLANT
SUCTION FRAZIER TIP 10 FR DISP (SUCTIONS) IMPLANT
SUT ETHILON 2 0 FS 18 (SUTURE) ×2 IMPLANT
SUT ETHILON 2 0 PSLX (SUTURE) ×2 IMPLANT
SUT ETHILON 3 0 PS 1 (SUTURE) IMPLANT
SUT VIC AB 0 CT1 27 (SUTURE) ×6
SUT VIC AB 0 CT1 27XBRD ANBCTR (SUTURE) ×2 IMPLANT
SUT VIC AB 2-0 CT1 27 (SUTURE) ×6
SUT VIC AB 2-0 CT1 TAPERPNT 27 (SUTURE) ×2 IMPLANT
SYR CONTROL 10ML LL (SYRINGE) IMPLANT
TOWEL OR 17X24 6PK STRL BLUE (TOWEL DISPOSABLE) ×6 IMPLANT
TOWEL OR 17X26 10 PK STRL BLUE (TOWEL DISPOSABLE) ×6 IMPLANT
TUBE CONNECTING 12'X1/4 (SUCTIONS) ×1
TUBE CONNECTING 12X1/4 (SUCTIONS) ×2 IMPLANT
UNDERPAD 30X30 INCONTINENT (UNDERPADS AND DIAPERS) ×3 IMPLANT
WATER STERILE IRR 1000ML POUR (IV SOLUTION) ×6 IMPLANT
WIRE K 1.6MM 144256 (MISCELLANEOUS) ×10 IMPLANT
YANKAUER SUCT BULB TIP NO VENT (SUCTIONS) ×3 IMPLANT
bar pin clamp ×4 IMPLANT
bar to bar clamp ×4 IMPLANT

## 2013-10-27 NOTE — Transfer of Care (Signed)
Immediate Anesthesia Transfer of Care Note  Patient: Kevin Kim  Procedure(s) Performed: Procedure(s):   REVISION OF  EXTERNAL FIXATOR RIGHT LOWER LEG   ORIF  RIGHT TIBIAL PILON APPLICATION OF WOUND VAC (Right)  Patient Location: PACU  Anesthesia Type:General  Level of Consciousness: awake and alert   Airway & Oxygen Therapy: Patient Spontanous Breathing and Patient connected to nasal cannula oxygen  Post-op Assessment: Report given to PACU RN and Post -op Vital signs reviewed and stable  Post vital signs: Reviewed and stable  Complications: No apparent anesthesia complications

## 2013-10-27 NOTE — Consult Note (Signed)
Please see other note on full dictation.  Altamese [place], MD [facility] [phone] (p)

## 2013-10-27 NOTE — Brief Op Note (Signed)
10/24/2013 - 10/27/2013  6:40 PM  PATIENT:  Kevin Kim  68 y.o. male  PRE-OPERATIVE DIAGNOSIS:  OPEN RIGHT DISTAL TIBIA AND FIBULA FRACTURE  POST-OPERATIVE DIAGNOSIS:  OPEN RIGHT DISTAL TIBIA AND FIBULA FRACTURE  PROCEDURE:  Procedure(s): 1. ORIF OF RIGHT TIBIAL PILON 2. REVISION OF  EXTERNAL FIXATOR RIGHT LOWER LEG 3. IRRIGATION AND DEBRIDEMENT OF OPEN FRACTURE INCLUDING BONE 4. APPLICATION OF WOUND VAC (Right)  SURGEON:  Surgeon(s) and Role:    * Rozanna Box, MD - Primary  PHYSICIAN ASSISTANT: Ainsley Spinner, PA-C  ANESTHESIA:   general  I/O:  Total I/O In: 1440 [P.O.:240; I.V.:1200] Out: 2755 [Urine:2705; Blood:50]  SPECIMEN:  No Specimen  TOURNIQUET:  None  DICTATION: .Other Dictation: Dictation Number [id number]

## 2013-10-27 NOTE — Anesthesia Preprocedure Evaluation (Addendum)
Anesthesia Evaluation  Patient identified by MRN, date of birth, ID band Patient awake    Reviewed: Allergy & Precautions, H&P , NPO status , Patient's Chart, lab work & pertinent test results  Airway Mallampati: II  TM Distance: >3 FB Neck ROM: Full    Dental  (+) Teeth Intact, Dental Advisory Given   Pulmonary former smoker,  breath sounds clear to auscultation        Cardiovascular Rhythm:regular Rate:Normal     Neuro/Psych    GI/Hepatic Neg liver ROS, GERD-  Medicated and Controlled,  Endo/Other  negative endocrine ROSMorbid obesity  Renal/GU negative Renal ROS     Musculoskeletal   Abdominal   Peds  Hematology   Anesthesia Other Findings   Reproductive/Obstetrics                          Anesthesia Physical Anesthesia Plan  ASA: II  Anesthesia Plan: General ETT   Post-op Pain Management:    Induction:   Airway Management Planned:   Additional Equipment:   Intra-op Plan:   Post-operative Plan:   Informed Consent:   Plan Discussed with:   Anesthesia Plan Comments:         Anesthesia Quick Evaluation

## 2013-10-27 NOTE — Consult Note (Signed)
I have seen and examined the patient. I agree with the findings above.  Right open pilon with ex-fix, forefoot varus. Repeat I&D with revision of external fixation to control forefoot; possible fixation if amendable.  I discussed with the patient the risks and benefits of surgery, including the possibility of infection, nerve injury, vessel injury, wound breakdown, arthritis, symptomatic hardware, DVT/ PE, loss of motion, and need for further surgery among others.  We also specifically discussed the need to stage surgery because of the elevated risk of soft tissue breakdown that could lead to amputation.  He understood these risks and wished to proceed.  Rozanna Box, MD 10/27/2013 8:08 AM

## 2013-10-27 NOTE — Progress Notes (Signed)
Orthopedic Tech Progress Note Patient Details:  Kevin Kim 1945-05-29 [id number]  Ortho Devices Type of Ortho Device: Ace wrap;Post (short leg) splint Ortho Device/Splint Location: applied ohf to bed Ortho Device/Splint Interventions: Ordered;Application   Braulio Bosch 10/27/2013, 4:56 PM

## 2013-10-28 ENCOUNTER — Encounter (HOSPITAL_COMMUNITY): Payer: Self-pay | Admitting: Physical Medicine and Rehabilitation

## 2013-10-28 LAB — BASIC METABOLIC PANEL
Anion gap: 10 (ref 5–15)
BUN: 10 mg/dL (ref 6–23)
CO2: 28 mEq/L (ref 19–32)
CREATININE: 0.76 mg/dL (ref 0.50–1.35)
Calcium: 8.2 mg/dL — ABNORMAL LOW (ref 8.4–10.5)
Chloride: 101 mEq/L (ref 96–112)
GFR calc non Af Amer: >90 mL/min (ref >90)
GLUCOSE: 124 mg/dL — AB (ref 70–99)
Potassium: 4.2 mEq/L (ref 3.7–5.3)
Sodium: 139 mEq/L (ref 137–147)

## 2013-10-28 LAB — CBC
HEMATOCRIT: 30.3 % — AB (ref 39.0–52.0)
HEMOGLOBIN: 10.2 g/dL — AB (ref 13.0–17.0)
MCH: 32.9 pg (ref 26.0–34.0)
MCHC: 33.7 g/dL (ref 30.0–36.0)
MCV: 97.7 fL (ref 78.0–100.0)
Platelets: 152 10*3/uL (ref 150–400)
RBC: 3.1 MIL/uL — ABNORMAL LOW (ref 4.22–5.81)
RDW: 12.3 % (ref 11.5–15.5)
WBC: 7.7 10*3/uL (ref 4.0–10.5)

## 2013-10-28 NOTE — Care Management Note (Signed)
CARE MANAGEMENT NOTE 10/28/2013  Patient:  Kevin Kim, Kevin Kim   Account Number:  [id number]  Date Initiated:  10/28/2013  Documentation initiated by:  Ricki Miller  Subjective/Objective Assessment:   68 yr old male admitted s/p fall with a right distal tibia fracture. Patient underwent right Tibia ORIF with external fixator and wound vac placement.     Action/Plan:   Case manager spoke with patient's daughter concerning home health needs at discharge. Referral called to Christa See, [facility] Liaison.   Anticipated DC Date:     Anticipated DC Plan:  [place]  CM consult      [facility] Choice  HOME HEALTH   Choice offered to / List presented to:  C-4 Adult Children   DME arranged  [facility]      DME agency  [facility]     HH arranged  HH-1 RN      [place]   Status of service:  In process, will continue to follow Medicare Important Message given?  YES (If response is "NO", the following Medicare IM given date fields will be blank) Date Medicare IM given:  10/28/2013 Medicare IM given by:  Ricki Miller Date Additional Medicare IM given:   Additional Medicare IM given by:    Discharge Disposition:    Per UR Regulation:  Reviewed for med. necessity/level of care/duration of stay  If discussed at [place] of Stay Meetings, dates discussed:    Comments:

## 2013-10-28 NOTE — ED Provider Notes (Signed)
I saw and evaluated the patient, reviewed the resident's note and I agree with the findings and plan.   EKG Interpretation None       Patient with fall and open tibia fracture. Consulted Dr. Alvan Dame shortly after arrival. Given Tdap, abx, and will go to OR.  Ephraim Hamburger, MD 10/28/13 [phone]

## 2013-10-28 NOTE — Progress Notes (Signed)
Orthopaedic Trauma Service Progress Note  Subjective  Doing ok Pain tolerable  Foley placed yesterday in OR with approx 1200cc out   Tolerating diet   Moderate drainage from [facility]   Review of Systems  Constitutional: Negative for fever and chills.  Respiratory: Negative for shortness of breath and wheezing.   Cardiovascular: Negative for chest pain and palpitations.  Gastrointestinal: Negative for nausea, vomiting and abdominal pain.  Genitourinary:       Foley in place   Neurological: Negative for tingling.     Objective   BP 114/54  Pulse 81  Temp(Src) 98.8 F (37.1 C) (Oral)  Resp 17  Ht 5\' 8"  (1.727 m)  Wt 132.45 kg (292 lb)  BMI 44.41 kg/m2  SpO2 98%  Intake/Output     10/27 0701 - 10/28 0700 10/28 0701 - 10/29 0700   P.O. 240    I.V. (mL/kg) 1200 (9.1)    Total Intake(mL/kg) 1440 (10.9)    Urine (mL/kg/hr) 4205 (1.3)    Blood 50 (0)    Total Output 4255     Net -2815            Labs  Results for SERIGNE, KUBICEK (MRN [id number]) as of 10/28/2013 08:53  Ref. Range 10/28/2013 06:03  Sodium Latest Range: 137-147 mEq/L 139  Potassium Latest Range: 3.7-5.3 mEq/L 4.2  Chloride Latest Range: 96-112 mEq/L 101  CO2 Latest Range: 19-32 mEq/L 28  BUN Latest Range: 6-23 mg/dL 10  Creatinine Latest Range: 0.50-1.35 mg/dL 0.76  Calcium Latest Range: 8.4-10.5 mg/dL 8.2 (L)  GFR calc non Af Amer Latest Range: >90 mL/min >90  GFR calc Af Amer Latest Range: >90 mL/min >90  Glucose Latest Range: 70-99 mg/dL 124 (H)  Anion gap Latest Range: 5-15  10  WBC Latest Range: 4.0-10.5 K/uL 7.7  RBC Latest Range: 4.22-5.81 MIL/uL 3.10 (L)  Hemoglobin Latest Range: 13.0-17.0 g/dL 10.2 (L)  HCT Latest Range: 39.0-52.0 % 30.3 (L)  MCV Latest Range: 78.0-100.0 fL 97.7  MCH Latest Range: 26.0-34.0 pg 32.9  MCHC Latest Range: 30.0-36.0 g/dL 33.7  RDW Latest Range: 11.5-15.5 % 12.3  Platelets Latest Range: 150-400 K/uL 152  Results for JAKIM, DRAPEAU (MRN [id number])  as of 10/28/2013 08:53  Ref. Range 10/27/2013 06:32  Vit D, 25-Hydroxy Latest Range: 30-89 ng/mL 43   Exam  Gen: awake and alert, resting comfortably in bed Lungs: clear Cardiac: RRR Abd: soft, NTND, + BS  Ext:       Right lower Extremity   pinsites draining  Ex fix stable  VAC functioning  Ext warm  + DP pulse  Swelling controlled  Distal motor and sensory functions intact      Assessment and Plan   POD/HD#: 1   68 y/o male s/p fall off ladder with open severely comminuted R pilon fracture and comminuted R fibula fracture  1. Fall off ladder  2. Open comminuted R pilon and fibula fxs s/p repeat I&D, ex fix revision and limited percutaneous fixation             no additional surgery planned   Definitive fixation completed  NWB x 8 weeks  Toe and knee motion as tolerated  PT/OT evals  Ice and elevate  VAC change tomorrow  Pin care to start tomorrow as well  3. Pain management:  Continue with current regimen  4. ABL anemia/Hemodynamics  Stable  5. Acute Urinary retention  Spoke with Dr. Lorra Hals (Urology), will continue with foley until next week. Follow  up with Urology as outpt for eval and foley removal  Continue with flomax as well   U/A negative   6. DVT/PE prophylaxis:  lovenox x 3 weeks  7. ID:   Completed abx for open fx treatment  8. Activity:  OOB as tolerated  PT/OT evals  9. FEN/Foley/Lines:  Continue with foley- DO NOT REMOVE    10.Ex-fix/Splint care:  Ok to lift extremity by fixator  Will start pin care tomorrow  11. Impediments to fracture healing:  Open fracture    12. Dispo:  PT/OT evals  CIR eval  Pt lives alone but does have family available to help but will still need to be somewhat independent before [place], PA-C [facility] [phone] (O) 10/28/2013 8:56 AM

## 2013-10-28 NOTE — Op Note (Signed)
[name], [name]            ACCOUNT NO.:  [id number]  MEDICAL RECORD NO.:  [id number]  LOCATION:  5N21C                        FACILITY:  [facility]  PHYSICIAN:  Astrid Divine. Marcelino Scot, M.D. DATE OF BIRTH:  1945-02-11  DATE OF PROCEDURE:  10/27/2013 DATE OF DISCHARGE:                              OPERATIVE REPORT   PREOPERATIVE DIAGNOSIS:  Open right distal tibia and fibula pilon fracture.  POSTOPERATIVE DIAGNOSIS:  Open right distal tibia and fibula pilon fracture.  PROCEDURE: 1. Open reduction and internal fixation of right tibial pilon, tibia     only. 2. Revision external fixation of right lower leg. 3. Irrigation and debridement of open fracture including bone. 4. Application of wound VAC small right leg.  SURGEON:  Astrid Divine. Marcelino Scot, MD  ASSISTANT:  Jari Pigg, PA-C.  ANESTHESIA:  General.  I/O:  1400 mL in, out 2755, with 2705 UOP, and 50 mL of EBL.  SPECIMENS:  None.  DISPOSITION:  To PACU.  CONDITION:  Stable.  BRIEF SUMMARY OF INDICATIONS FOR PROCEDURE:  Kevin Kim is a 68- year-old male who sustained a rather severe fracture of his right tibia and fibula from a fall from a ladder.  He initially was seen and evaluated by Dr. Paralee Cancel who felt the patient would benefit from definitive management by musculoskeletal traumatologist given the comminution and pattern of his open fracture.  I discussed with the patient the risks and benefits of surgery including the possibility of infection, nerve injury, vessel injury, DVT, PE, heart attack, stroke, arthritis, loss of motion, need for further surgery among others.  The patient acknowledged these risks and did wish to proceed.  BRIEF SUMMARY OF PROCEDURE:  The patient received Ancef preoperatively. He was taken to the operating room where general anesthesia was induced. His right lower extremity was prepped and draped in usual sterile fashion with retention of the fixator.  After a time-out, the  fixator bars were removed and the traumatic wound reopened.  This exposed the fracture end and a curette was taken to them with extensive pulsatile irrigation.  I did remove some small completely devitalized segments of cortical bone.  This was performed on both the proximal metaphyseal segment as well as the distal subchondral and articular segments.  Once this was performed, we applied fresh drapes and attire and carefully evaluated the wound.  Because of the hockey stick shaped incision that extended posteromedially traveling completely horizontally and then up at an angle to the lateral aspect of the leg effectively produced a very high risk situation for definitive open repair of this pilon either from a medial or anterolateral approach. Consequently, I made the decision to proceed with definitive internal fixation using K-wires and screws to reconstruct the articular block and retaining the external fixator with adjustments to control the metaphysis and shaft.  Consequently, in stepwise fashion, reconstruction was performed using a small medial arthrotomy through which I inserted a K-wire to grasp the posterolateral subchondral segment and levered this down through the traumatic wound medially.  I inserted a ball spike pusher on the posterior malleolus, and then I also used a bone tamp introduced through the fracture site to help move the articular blocks distally into  a reduced position.  A series of K-wires were then used to achieve provisional fixation.  This was checked under x-ray, followed by definitive fixation with multiple cannulated screws, and then part of that reconstruction was metaphyseal pin.  Frequent use of x-ray in the AP, mortise, and lateral views were obtained, and the reduction fine tuned accordingly.  Final images showed a vast improvement of the articular reduction with restoration of the joint surface with minimal areas of incongruity and appropriately  aligned articular surface relative to the shaft and metaphysis.  I also applied external fixator pins into both the first and fifth metatarsal to control the foot position given the plan is to retain this frame for long period of time given the open fracture, most likely 8-12 weeks.  The wounds were all irrigated and then closed in standard layered fashion using 2-0 PDS for the medial side and 3-0 nylon with far near, near far sutures, and PDS and nylon for the other wounds as well.  Sterile gently compressive dressing was applied.  The pin sites were wrapped with Kerlix.  A small wound VAC was placed over Mepitel over the medial traumatic wound given the considerable amount of drainage present today and to help keep the wound in good condition rather than macerating.  Kevin Spinner, PA-C assisted me throughout these procedures and his assistance was absolutely necessary as the reduction could not have been obtained to maintain without while also applying provisional and definitive fixation.  Consequently, we exchanged those rolls as I deemed appropriate to perform the reconstruction.  He did also assist me with wound closure.  PROGNOSIS:  Kevin Kim is at increased risk for arthritis, nonunion, malunion, loss of motion, and other complications related to his open tibial pilon fracture involving both the tibia and fibula.  It is severely comminuted.  We are hopeful with the reconstruction that we are able to obtain today, that we will be able to realize good function in spite of these factors, and we plan to follow his wound carefully as he may require plastic surgery for free tissue transfer in the event the wound does not stabilize with the use of the wound VAC.  This could lead to limb threatening infection.  He will be on DVT prophylaxis pharmacologically while in the hospital and discharged on same.     Astrid Divine. Marcelino Scot, M.D.     MHH/MEDQ  D:  10/27/2013  T:  10/28/2013   Job:  [id number]

## 2013-10-28 NOTE — Progress Notes (Signed)
OT Cancellation Note  Patient Details Name: Kevin Kim MRN: [id number] DOB: 12/14/1945   Cancelled Treatment:    Reason Eval/Treat Not Completed: Other (comment) (PT recommending SNF, deferring OT needs to next venue)  Hortencia Pilar 10/28/2013, 1:31 PM

## 2013-10-28 NOTE — Progress Notes (Signed)
Rehab admissions - I met with pt and his daughter in follow up to rehab MD consult and explained that our rehab MD is recommending skilled nursing for further therapy. Per Dr. Letta Pate, pt "has prolonged nonweightbearing status, poor exercise tolerance, will not need intensive OT or PT". Pt and dtr were understanding of this recommendation and pt was concerned if Medicare would "pay for the nursing home."   I explained that social work would follow up with pt/family and help to plan for SNF. I called and left voicemail with Raquel Sarna, Education officer, museum and with Manuela Schwartz, Tourist information centre manager.  I will now sign off pt's case. Please call me with any questions. Thanks.  Nanetta Batty, PT Rehabilitation Admissions Coordinator [phone]

## 2013-10-28 NOTE — Evaluation (Signed)
Physical Therapy Evaluation Patient Details Name: Kevin Kim MRN: [id number] DOB: 1945/06/09 Today's Date: 10/28/2013   History of Present Illness  Pt fell from a ladder sustaining a R Tib/Fib open Fx. Pt with an external fixator and wound vac.  Clinical Impression  Pt presents with dependencies in mobility secondary to R tib/fib fx. Pt is unable to tolerate much activity at this point due to c/o dizziness/nausea/pain. Pt sat on the edge of the bed with min assist for bed mobility and immediately reported feelings of lightheadedness. Returned supine in bed and BP 136/61. Pt lives alone and reports his townhouse is not very accessible with a w/c. Pt is R NWB, would vac, and external fixator. I would recommend d/c to SNF for continued PT. Pt would benefit from acute PT to facilitate increased independence with mobility.    Follow Up Recommendations SNF    Equipment Recommendations  None recommended by PT    Recommendations for Other Services       Precautions / Restrictions Precautions Precautions: Other (comment) Precaution Comments: wound vac, external fixator Restrictions Weight Bearing Restrictions: Yes RLE Weight Bearing: Non weight bearing      Mobility  Bed Mobility Overal bed mobility: Needs Assistance Bed Mobility: Supine to Sit     Supine to sit: Min assist;HOB elevated     General bed mobility comments: multiple rest breaks for pain. Pt became nauseated and dizzy with sitting edge of bed. Pt requested return to supine. BP 136/61  Transfers Overall transfer level:  (unable to attempt due to nausea/dizziness)                  Ambulation/Gait                Stairs            Wheelchair Mobility    Modified Rankin (Stroke Patients Only)       Balance Overall balance assessment: Needs assistance Sitting-balance support: Bilateral upper extremity supported;Feet unsupported Sitting balance-Leahy Scale: Fair                                        Pertinent Vitals/Pain Pain Assessment: 0-10 Pain Score: 8  Pain Location: R leg Pain Descriptors / Indicators: Aching;Discomfort Pain Intervention(s): Limited activity within patient's tolerance;Monitored during session    Home Living Family/patient expects to be discharged to:: Private residence Living Arrangements: Alone   Type of Home: House Home Access: Stairs to enter Entrance Stairs-Rails: None Technical brewer of Steps: 1 Home Layout: One level Home Equipment: None      Prior Function Level of Independence: Independent               Hand Dominance        Extremity/Trunk Assessment   Upper Extremity Assessment: Defer to OT evaluation           Lower Extremity Assessment: RLE deficits/detail RLE Deficits / Details: unable to fully assess due to pain and external fixator, pt able to assist with moving leg over in the bed, able to wiggle toes.       Communication   Communication: No difficulties  Cognition Arousal/Alertness: Awake/alert Behavior During Therapy: WFL for tasks assessed/performed Overall Cognitive Status: Within Functional Limits for tasks assessed                      General Comments General comments (skin  integrity, edema, etc.): Pt reports he has a history of a low pain tolerance and he passes out if he experiences too much pain.    Exercises        Assessment/Plan    PT Assessment Patient needs continued PT services  PT Diagnosis Difficulty walking;Acute pain   PT Problem List Decreased strength;Decreased activity tolerance;Decreased balance;Decreased mobility;Decreased knowledge of use of DME;Pain  PT Treatment Interventions DME instruction;Gait training;Functional mobility training;Therapeutic activities;Therapeutic exercise;Balance training;Patient/family education   PT Goals (Current goals can be found in the Care Plan section) Acute Rehab PT Goals Patient Stated Goal: non  stated PT Goal Formulation: With patient Time For Goal Achievement: 11/04/13 Potential to Achieve Goals: Good    Frequency Min 6X/week   Barriers to discharge Other (comment) (lives alone)      Co-evaluation               End of Session   Activity Tolerance: Other (comment) (nausea/pain/dizziness) Patient left: in bed;with call bell/phone within reach;with family/visitor present Nurse Communication: Mobility status         Time: 7829-5621 PT Time Calculation (min): 24 min   Charges:   PT Evaluation $Initial PT Evaluation Tier I: 1 Procedure PT Treatments $Therapeutic Activity: 8-22 mins   PT G Codes:          Lelon Mast 10/28/2013, 12:56 PM

## 2013-10-28 NOTE — Consult Note (Signed)
Physical Medicine and Rehabilitation Consult  Reason for Consult: Open right severely comminuted open right distal tibia and fibular  fractures. Referring Physician: Dr. Marcelino Scot    HPI: Kevin Kim is a 68 y.o. male with history of GERD, Bell's palsy,  irregular heartbeat; who was admitted on 10/24/13 past fall off ladder with RLE deformity and open wound on knee. He was working on a ladder which tripped to the side and he lept from it landing on his feet with torsion. X rays done revealed grade I-II open right distal meta-diaphyseal tibia/fibula fracture and he underwent I and D of open wound with CR with placement of external fixator on right tib-fib fractures by Dr. Alvan Dame. Patient with numbness RLE as well as edema.  He was evaluated by Dr. Marcelino Scot and underwent ORIF right tibial pilon with revision external fixation of leg and placement of wound VAC as well as I and D of open fracture on 10/27 pm. Foley placed pre-op with 1000 cc return. Is NWB RLE and on lovenox for DVT prophylaxis. PT/OT evaluations pending. CIR recommended by MD.   PT evaluation showed poor exercise tolerance. SNF recommended. Patient has dizziness when he gets up. This has been since surgery. Denies this at home. Denies any loss of consciousness during the fall. He did hit his nose on the ladder.  Review of Systems  HENT: Negative for hearing loss.   Eyes: Negative for blurred vision and double vision.  Respiratory: Negative for shortness of breath.   Cardiovascular: Negative for chest pain and palpitations.  Gastrointestinal: Positive for constipation. Negative for heartburn and vomiting.  Genitourinary:       Hesitancy and nocturia past couple of months  Musculoskeletal: Positive for falls and neck pain (Just completed therapy for neck injury past fall off truck in May).  Neurological: Positive for tingling and focal weakness. Negative for dizziness, sensory change and headaches.   Past Medical History    Diagnosis Date  . GERD (gastroesophageal reflux disease)   . Irregular heartbeat    Past Surgical History  Procedure Laterality Date  . Hernia repair     Family History  Problem Relation Age of Onset  . Cancer Mother   . Cancer Father     Social History:  Lives alone--has a daughter who can check in past discharge. Independent PTA. Retired from the [employer].  He reports that he smokes < 1 pack/month.  He has never used smokeless tobacco. He reports that he drinks alcohol--3 beers/daily. He reports that he does not use illicit drugs.   Allergies  Allergen Reactions  . Amoxicillin Other (See Comments)    Gi upset; abdominal pain  . Peanut-Containing Drug Products Hives    All nuts    Medications Prior to Admission  Medication Sig Dispense Refill  . aspirin 81 MG chewable tablet Chew 81 mg by mouth every morning.      . lansoprazole (PREVACID) 30 MG capsule Take 30 mg by mouth every morning.       . Multiple Vitamins-Minerals (MULTIVITAMINS THER. W/MINERALS) TABS Take 1 tablet by mouth every morning. Centrum Silver      . Vitamin D, Ergocalciferol, (DRISDOL) 50000 UNITS CAPS capsule Take 50,000 Units by mouth once a week. On Fridays        Home: Home Living Family/patient expects to be discharged to:: Private residence Living Arrangements: Alone  Functional History:   Functional Status:  Mobility:  ADL:    Cognition: Cognition Orientation Level: Oriented X4    Blood pressure 114/54, pulse 81, temperature 98.8 F (37.1 C), temperature source Oral, resp. rate 17, height 5\' 8"  (1.727 m), weight 132.45 kg (292 lb), SpO2 98.00%. Physical Exam  Nursing note and vitals reviewed. Constitutional: He is oriented to person, place, and time. He appears well-developed and well-nourished.  HENT:  Head: Normocephalic and atraumatic.  Eyes: Conjunctivae are normal. Pupils are equal, round, and reactive to light.  Neck: Normal range of motion. Neck supple.   Cardiovascular: Normal rate and regular rhythm.   Respiratory: Effort normal and breath sounds normal. No respiratory distress. He has no wheezes.  GI: Soft. Bowel sounds are normal. He exhibits distension. There is no tenderness.  Musculoskeletal:  Right shin with external fixator and compressive dressing in place. Gauze with bloody drainage at ankle. Moderate edema noted.   Neurological: He is alert and oriented to person, place, and time.  Flat affect. Measured speech. Able to follow commands without difficulty. Sensory deficits from right lower thigh to feet.   Skin: Skin is warm and dry.  Drain from right leg with serosanguineous fluid MA strength is 5/5 bilateral deltoids, biceps, triceps, grip Left looks 74/5 and hip flexor and extensor ankle dorsiflexor are flexor 3 minus at the right hip flexor limited by pain. Unable to test for an ankle secondary to external fixator. Sensory intact to light touch in the left lower extremityAs well as the toes of the right lower extremity  Results for orders placed during the hospital encounter of 10/24/13 (from the past 24 hour(s))  CBC     Status: Abnormal   Collection Time    10/27/13  4:43 PM      Result Value Ref Range   WBC 8.5  4.0 - 10.5 K/uL   RBC 3.27 (*) 4.22 - 5.81 MIL/uL   Hemoglobin 10.6 (*) 13.0 - 17.0 g/dL   HCT 31.1 (*) 39.0 - 52.0 %   MCV 95.1  78.0 - 100.0 fL   MCH 32.4  26.0 - 34.0 pg   MCHC 34.1  30.0 - 36.0 g/dL   RDW 12.2  11.5 - 15.5 %   Platelets 162  150 - 400 K/uL  CREATININE, SERUM     Status: None   Collection Time    10/27/13  4:43 PM      Result Value Ref Range   Creatinine, Ser 0.76  0.50 - 1.35 mg/dL   GFR calc non Af Amer >90  >90 mL/min   GFR calc Af Amer >90  >90 mL/min  URINALYSIS, ROUTINE W REFLEX MICROSCOPIC     Status: None   Collection Time    10/27/13 10:50 PM      Result Value Ref Range   Color, Urine YELLOW  YELLOW   APPearance CLEAR  CLEAR   Specific Gravity, Urine 1.008  1.005 - 1.030    pH 6.5  5.0 - 8.0   Glucose, UA NEGATIVE  NEGATIVE mg/dL   Hgb urine dipstick NEGATIVE  NEGATIVE   Bilirubin Urine NEGATIVE  NEGATIVE   Ketones, ur NEGATIVE  NEGATIVE mg/dL   Protein, ur NEGATIVE  NEGATIVE mg/dL   Urobilinogen, UA 0.2  0.0 - 1.0 mg/dL   Nitrite NEGATIVE  NEGATIVE   Leukocytes, UA NEGATIVE  NEGATIVE  CBC     Status: Abnormal   Collection Time    10/28/13  6:03 AM      Result Value Ref Range   WBC  7.7  4.0 - 10.5 K/uL   RBC 3.10 (*) 4.22 - 5.81 MIL/uL   Hemoglobin 10.2 (*) 13.0 - 17.0 g/dL   HCT 30.3 (*) 39.0 - 52.0 %   MCV 97.7  78.0 - 100.0 fL   MCH 32.9  26.0 - 34.0 pg   MCHC 33.7  30.0 - 36.0 g/dL   RDW 12.3  11.5 - 15.5 %   Platelets 152  150 - 400 K/uL  BASIC METABOLIC PANEL     Status: Abnormal   Collection Time    10/28/13  6:03 AM      Result Value Ref Range   Sodium 139  137 - 147 mEq/L   Potassium 4.2  3.7 - 5.3 mEq/L   Chloride 101  96 - 112 mEq/L   CO2 28  19 - 32 mEq/L   Glucose, Bld 124 (*) 70 - 99 mg/dL   BUN 10  6 - 23 mg/dL   Creatinine, Ser 0.76  0.50 - 1.35 mg/dL   Calcium 8.2 (*) 8.4 - 10.5 mg/dL   GFR calc non Af Amer >90  >90 mL/min   GFR calc Af Amer >90  >90 mL/min   Anion gap 10  5 - 15   Dg Ankle Complete Right  10/27/2013   CLINICAL DATA:  Adjustment of external fixation device for fixation of a right distal tibia/ankle fracture.  EXAM: DG C-ARM GT 120 MIN; RIGHT ANKLE - COMPLETE 3+ VIEW  COMPARISON:  CT, 10/26/2013.  FINDINGS: Portable images show placement of fixation screws across the distal tibia reducing the subchondral bone fragments into near anatomic alignment. Multiple additional comminuted fractures are noted along the distal fibula and distal tibia, without significant angulation or displacement.  No evidence of an operative complication.  IMPRESSION: ORIF of distal tibial fractures.   Electronically Signed   By: Lajean Manes M.D.   On: 10/27/2013 12:55   Dg Ankle Right Port  10/27/2013   CLINICAL DATA:  Status post  suggest not of external fixation device and ORIF of a right pilon fracture.  EXAM: PORTABLE RIGHT ANKLE - 2 VIEW  COMPARISON:  Earlier operative images obtained this same date.  FINDINGS: Images show placement 3 screws across the distal tibia just above the tibial plafond. There are multiple comminuted fractures of the distal tibia and fibula. Fractures are slightly displaced with some angulation, most notably the tibia, mildly angulated laterally. External fixator spans the mid tibia to the foot.  IMPRESSION: Images from the ORIF and adjustment of an external fixator for extensive comminuted fractures of the distal tibia and fibula. No radiographic evidence of an operative complication.   Electronically Signed   By: Lajean Manes M.D.   On: 10/27/2013 13:57   Dg C-arm Gt 120 Min  10/27/2013   CLINICAL DATA:  Adjustment of external fixation device for fixation of a right distal tibia/ankle fracture.  EXAM: DG C-ARM GT 120 MIN; RIGHT ANKLE - COMPLETE 3+ VIEW  COMPARISON:  CT, 10/26/2013.  FINDINGS: Portable images show placement of fixation screws across the distal tibia reducing the subchondral bone fragments into near anatomic alignment. Multiple additional comminuted fractures are noted along the distal fibula and distal tibia, without significant angulation or displacement.  No evidence of an operative complication.  IMPRESSION: ORIF of distal tibial fractures.   Electronically Signed   By: Lajean Manes M.D.   On: 10/27/2013 12:55    Assessment/Plan: Diagnosis: Right tib-fib fracture after fall, nonweightbearing for  several weeks in external  fixator 1. Does the need for close, 24 hr/day medical supervision in concert with the patient's rehab needs make it unreasonable for this patient to be served in a less intensive setting? No 2. Co-Morbidities requiring supervision/potential complications: Pain 3. Due to skin/wound care, medication administration and pain management, does the patient require 24  hr/day rehab nursing? Potentially 4. Does the patient require coordinated care of a physician, rehab nurse, PT, OT to address physical and functional deficits in the context of the above medical diagnosis(es)? No Addressing deficits in the following areas: balance, strength, transferring, bathing and dressing 5. Can the patient actively participate in an intensive therapy program of at least 3 hrs of therapy per day at least 5 days per week? No 6. The potential for patient to make measurable gains while on inpatient rehab is not applicable 7. Anticipated functional outcomes upon discharge from inpatient rehab are n/a  with PT, n/a with OT, n/a with SLP. 8. Estimated rehab length of stay to reach the above functional goals is: Not applicable 9. Does the patient have adequate social supports to accommodate these discharge functional goals? Potentially 10. Anticipated D/C setting: Home 11. Anticipated post D/C treatments: [facility] therapy 12. Overall Rehab/Functional Prognosis: good  RECOMMENDATIONS: This patient's condition is appropriate for continued rehabilitative care in the following setting: SNF Patient has agreed to participate in recommended program. Potentially Note that insurance prior authorization may be required for reimbursement for recommended care.  Comment: Has prolonged nonweightbearing status, poor exercise tolerance, will not need intensive OT or PT    10/28/2013

## 2013-10-28 NOTE — Care Management Note (Signed)
2nd IM letter given. Ricki Miller, RN BSN Case manager

## 2013-10-29 LAB — BASIC METABOLIC PANEL
Anion gap: 11 (ref 5–15)
BUN: 11 mg/dL (ref 6–23)
CO2: 26 meq/L (ref 19–32)
Calcium: 8.5 mg/dL (ref 8.4–10.5)
Chloride: 100 mEq/L (ref 96–112)
Creatinine, Ser: 0.72 mg/dL (ref 0.50–1.35)
GFR calc Af Amer: >90 mL/min (ref >90)
Glucose, Bld: 122 mg/dL — ABNORMAL HIGH (ref 70–99)
POTASSIUM: 4.1 meq/L (ref 3.7–5.3)
SODIUM: 137 meq/L (ref 137–147)

## 2013-10-29 MED ORDER — BISACODYL 10 MG RE SUPP
10.0000 mg | Freq: Once | RECTAL | Status: AC
  Administered 2013-10-29: 10 mg via RECTAL
  Filled 2013-10-29: qty 1

## 2013-10-29 NOTE — Clinical Social Work Psychosocial (Signed)
Clinical Social Work Department BRIEF PSYCHOSOCIAL ASSESSMENT 10/29/2013  Patient:  Kevin Kim, Kevin Kim     Account Number:  [id number]     Admit date:  10/24/2013  Clinical Social Worker:  Gwenevere Abbot,  Date/Time:  10/29/2013 01:02 PM  Referred by:  Physician  Date Referred:  10/29/2013 Referred for  SNF Placement   Other Referral:   Interview type:  Patient Other interview type:    PSYCHOSOCIAL DATA Living Status:  ALONE Admitted from facility:   Level of care:   Primary support name:  Kevin Kim Primary support relationship to patient:  CHILD, ADULT Degree of support available:   Kevin Kim [phone]  She is strongly supportive and is involved in the decision making.    CURRENT CONCERNS Current Concerns  Post-Acute Placement   Other Concerns:    SOCIAL WORK ASSESSMENT / PLAN CSW and CSW intern spoke with patient and daughter Kevin Kim was present at bedside.CSW and CSW intern talked with patient and daughter Kevin Kim concerning discharge to a rehab facility.His daughter was given a list of facilities located in the [place] area,explained the search process, and insurance information. His daughter expressed that she had  preferences include [facility]. Daughter asked how he would be transported from hospital to facility. CSW informed her that [facility] will assist in transportation to facility. Patient informed CSW and CSW intern that he lives alone. The patient has 3 other sons.   Assessment/plan status:  Psychosocial Support/Ongoing Assessment of Needs Other assessment/ plan:   N/A   Information/referral to community resources:   Patient daughter recieved list of SNF facilities  located in the [place] area.    PATIENT'S/FAMILY'S RESPONSE TO PLAN OF CARE: Patient and daughter was agreeable to plan of care. Patient and daughter had no further questions at this time.

## 2013-10-29 NOTE — Progress Notes (Signed)
Physical Therapy Treatment Patient Details Name: Kevin Kim MRN: [id number] DOB: January 02, 1945 Today's Date: 10/29/2013    History of Present Illness Pt fell from a ladder sustaining a R Tib/Fib open Fx. Pt with an external fixator and wound vac.    PT Comments    Pt was able to tolerate sitting EOB ~5 min, pt then became very lightheaded and required return to supine position. Pt verbalizing throughout and oriented. Pt very lethargic at end of session. Repositioned with Rt LE elevated. Will cont to follow per POC.   Follow Up Recommendations  SNF     Equipment Recommendations  None recommended by PT    Recommendations for Other Services       Precautions / Restrictions Precautions Precautions: Fall Precaution Comments: wound vac, external fixator Restrictions Weight Bearing Restrictions: Yes RLE Weight Bearing: Non weight bearing    Mobility  Bed Mobility Overal bed mobility: Needs Assistance;+ 2 for safety/equipment Bed Mobility: Supine to Sit;Sit to Supine     Supine to sit: Min assist;+2 for physical assistance;HOB elevated Sit to supine: Mod assist;+2 for physical assistance   General bed mobility comments: pt requiring 1 person to (A) advancing Rt LE to/off EOB while send person brings Lt hip out to sitting position; incr time due to pain and "dizziness"; required 2 person (A) to return back to supine when lightheadedness worsened   Transfers                 General transfer comment: unable to safely assess today  Ambulation/Gait                 Stairs            Wheelchair Mobility    Modified Rankin (Stroke Patients Only)       Balance Overall balance assessment: Needs assistance Sitting-balance support: Feet unsupported;Bilateral upper extremity supported (Rt LE unable to support on ground ) Sitting balance-Leahy Scale: Poor Sitting balance - Comments: relying heavily on UEs to brace and maintain balance; tolerated sitting  EOB 5 min then requiring return to supine due to incr lightheadedness  Postural control: Posterior lean                          Cognition Arousal/Alertness: Awake/alert Behavior During Therapy: WFL for tasks assessed/performed Overall Cognitive Status: Within Functional Limits for tasks assessed                      Exercises      General Comments General comments (skin integrity, edema, etc.): pt with hx of "vagal response" per daughter       Pertinent Vitals/Pain Pain Assessment: 0-10 Pain Score: 5  Pain Location: Rt ankle Pain Descriptors / Indicators: Aching Pain Intervention(s): Monitored during session;Premedicated before session;Repositioned    Home Living                      Prior Function            PT Goals (current goals can now be found in the care plan section) Acute Rehab PT Goals Patient Stated Goal: to get moving PT Goal Formulation: With patient/family Time For Goal Achievement: 11/04/13 Potential to Achieve Goals: Good Progress towards PT goals: Progressing toward goals    Frequency  Min 3X/week    PT Plan Current plan remains appropriate;Frequency needs to be updated    Co-evaluation  End of Session   Activity Tolerance: Other (comment) (c/o dizziness/lightheadedness) Patient left: in bed;with call bell/phone within reach;with family/visitor present     Time: 3009-2330 PT Time Calculation (min): 24 min  Charges:  $Therapeutic Activity: 23-37 mins                    G CodesGustavus Bryant, Geraldine 10/29/2013, 12:42 PM

## 2013-10-29 NOTE — Progress Notes (Signed)
[facility] Progress Note  Subjective  Doing ok Pain tolerable + flatus No BM Tolerating diet  Worked with therapy, did get a little light headed with PT   Review of Systems  Constitutional: Negative for fever and chills.  Eyes: Negative for blurred vision.  Respiratory: Negative for shortness of breath and wheezing.   Cardiovascular: Negative for chest pain and palpitations.  Gastrointestinal: Negative for nausea, vomiting and abdominal pain.  Genitourinary:       Foley   Neurological: Negative for tingling, sensory change and headaches.     Objective   BP 131/71  Pulse 88  Temp(Src) 98.9 F (37.2 C) (Oral)  Resp 17  Ht 5\' 8"  (1.727 m)  Wt 132.45 kg (292 lb)  BMI 44.41 kg/m2  SpO2 95%  Intake/Output     10/28 0701 - 10/29 0700 10/29 0701 - 10/30 0700   P.O. 420    I.V. (mL/kg) 1340 (10.1)    IV Piggyback 120    Total Intake(mL/kg) 1880 (14.2)    Urine (mL/kg/hr) 3150 (1) 425 (0.4)   Drains 25 (0)    Blood     Total Output 3175 425   Net -1295 -425          Labs  Results for LIOR, HOEN (MRN [id number]) as of 10/29/2013 17:21  Ref. Range 10/29/2013 05:20  Sodium Latest Range: 137-147 mEq/L 137  Potassium Latest Range: 3.7-5.3 mEq/L 4.1  Chloride Latest Range: 96-112 mEq/L 100  CO2 Latest Range: 19-32 mEq/L 26  BUN Latest Range: 6-23 mg/dL 11  Creatinine Latest Range: 0.50-1.35 mg/dL 0.72  Calcium Latest Range: 8.4-10.5 mg/dL 8.5  GFR calc non Af Amer Latest Range: >90 mL/min >90  GFR calc Af Amer Latest Range: >90 mL/min >90  Glucose Latest Range: 70-99 mg/dL 122 (H)  Anion gap Latest Range: 5-15  11    Exam  Gen: awake and alert, resting comfortably in bed Lungs: clear Cardiac: RRR Abd: soft, NTND, + BS   Ext:        Right lower Extremity               pinsites draining             Ex fix stable             VAC functioning             Ext warm             + DP pulse             Swelling controlled  Distal motor and sensory functions intact                     Assessment and Plan   POD/HD#: 2   68 y/o male s/p fall off ladder with open severely comminuted R pilon fracture and comminuted R fibula fracture  1. Fall off ladder  2. Open comminuted R pilon and fibula fxs s/p repeat I&D, ex fix revision and limited percutaneous fixation                          NWB x 8 weeks             Toe and knee motion as tolerated             PT/OT evals             Ice and elevate  VAC change tomorrow             Pin care to start tomorrow as well  3. Pain management             Continue with current regimen  4. ABL anemia/Hemodynamics             Stable  5. Acute Urinary retention             continue with foley until next week              Continue with flomax as well               U/A negative   6. DVT/PE prophylaxis:             lovenox x 3 weeks  7. ID:               Completed abx for open fx treatment  8. Activity:             OOB as tolerated             PT/OT evals  9. FEN/Foley/Lines:             Continue with foley- DO NOT REMOVE                10.Ex-fix/Splint care:             Ok to lift extremity by fixator             Will start pin care tomorrow  11. Impediments to fracture healing:             Open fracture                12. Dispo:             continue with PT/OT  May need SNF        Jari Pigg, PA-C [facility] [phone] (O) 10/29/2013 4:06 PM

## 2013-10-30 ENCOUNTER — Encounter (HOSPITAL_COMMUNITY): Payer: Self-pay | Admitting: Orthopedic Surgery

## 2013-10-30 DIAGNOSIS — W11XXXA Fall on and from ladder, initial encounter: Secondary | ICD-10-CM

## 2013-10-30 DIAGNOSIS — K219 Gastro-esophageal reflux disease without esophagitis: Secondary | ICD-10-CM

## 2013-10-30 DIAGNOSIS — R338 Other retention of urine: Secondary | ICD-10-CM

## 2013-10-30 LAB — CBC
HEMATOCRIT: 33.2 % — AB (ref 39.0–52.0)
Hemoglobin: 11.1 g/dL — ABNORMAL LOW (ref 13.0–17.0)
MCH: 32.6 pg (ref 26.0–34.0)
MCHC: 33.4 g/dL (ref 30.0–36.0)
MCV: 97.4 fL (ref 78.0–100.0)
Platelets: 215 10*3/uL (ref 150–400)
RBC: 3.41 MIL/uL — AB (ref 4.22–5.81)
RDW: 12.4 % (ref 11.5–15.5)
WBC: 8.5 10*3/uL (ref 4.0–10.5)

## 2013-10-30 LAB — BASIC METABOLIC PANEL
Anion gap: 12 (ref 5–15)
BUN: 12 mg/dL (ref 6–23)
CALCIUM: 8.4 mg/dL (ref 8.4–10.5)
CHLORIDE: 101 meq/L (ref 96–112)
CO2: 26 meq/L (ref 19–32)
CREATININE: 0.75 mg/dL (ref 0.50–1.35)
GFR calc Af Amer: >90 mL/min (ref >90)
GFR calc non Af Amer: >90 mL/min (ref >90)
GLUCOSE: 118 mg/dL — AB (ref 70–99)
Potassium: 4.4 mEq/L (ref 3.7–5.3)
Sodium: 139 mEq/L (ref 137–147)

## 2013-10-30 LAB — VITAMIN D 1,25 DIHYDROXY
VITAMIN D2 1, 25 (OH): 40 pg/mL
VITAMIN D3 1, 25 (OH): 35 pg/mL
Vitamin D 1, 25 (OH)2 Total: 75 pg/mL — ABNORMAL HIGH (ref 18–72)

## 2013-10-30 MED ORDER — ENOXAPARIN SODIUM 40 MG/0.4ML ~~LOC~~ SOLN: 40.0000 mg | SUBCUTANEOUS | Status: DC

## 2013-10-30 MED ORDER — METHOCARBAMOL 500 MG PO TABS: 500.0000 mg | ORAL_TABLET | Freq: Four times a day (QID) | ORAL | Status: DC | PRN

## 2013-10-30 MED ORDER — OXYCODONE-ACETAMINOPHEN 5-325 MG PO TABS: 1.0000 | ORAL_TABLET | Freq: Four times a day (QID) | ORAL | Status: DC | PRN

## 2013-10-30 MED ORDER — HYDROXYZINE HCL 50 MG PO TABS: 50.0000 mg | ORAL_TABLET | Freq: Three times a day (TID) | ORAL | Status: DC | PRN

## 2013-10-30 MED ORDER — OXYCODONE HCL 5 MG PO TABS: 5.0000 mg | ORAL_TABLET | Freq: Four times a day (QID) | ORAL | Status: DC | PRN

## 2013-10-30 MED ORDER — METHOCARBAMOL 500 MG PO TABS
500.0000 mg | ORAL_TABLET | Freq: Four times a day (QID) | ORAL | Status: DC | PRN
  Administered 2013-11-02 (×2): 500 mg via ORAL
  Filled 2013-10-30: qty 2
  Filled 2013-10-30 (×2): qty 1

## 2013-10-30 MED ORDER — PREGABALIN 75 MG PO CAPS: 75.0000 mg | ORAL_CAPSULE | Freq: Two times a day (BID) | ORAL | Status: DC

## 2013-10-30 MED ORDER — TAMSULOSIN HCL 0.4 MG PO CAPS: 0.4000 mg | ORAL_CAPSULE | Freq: Every day | ORAL | Status: DC

## 2013-10-30 MED ORDER — DSS 100 MG PO CAPS: 100.0000 mg | ORAL_CAPSULE | Freq: Two times a day (BID) | ORAL | Status: DC

## 2013-10-30 NOTE — Progress Notes (Signed)
No nausea report this AM

## 2013-10-30 NOTE — Progress Notes (Signed)
Orthopaedic Trauma Service Progress note  Pt has been somewhat slow to mobilize sat on the EOB for about 5 min yesterday Has not worked with PT yet today Will have pt continue to work with PT over weekend and plan for dc to SNF on Monday  Continue inpatient care  Jari Pigg, PA-C [facility] [phone] (P) 10/30/2013 4:00 PM

## 2013-10-30 NOTE — Discharge Instructions (Addendum)
[facility]uma [facility] Discharge Instructions   General Discharge Instructions  WEIGHT BEARING STATUS: Nonweightbearing R leg  RANGE OF MOTION/ACTIVITY: No ROM R ankle, ok to move toes and knee   Wound Care: dressing changes every other day starting on 11/04/2013 to R ankle- adaptic, 4x4s, kerlix, ace. Do not use xeroform or vaseline gauze. Daily pincare. Clean with soap and water. Dress pins with kerlix  Diet: as you were eating previously.  Can use over the counter stool softeners and bowel preparations, such as Miralax, to help with bowel movements.  Narcotics can be constipating.  Be sure to drink plenty of fluids  STOP SMOKING OR USING NICOTINE PRODUCTS!!!!  As discussed nicotine severely impairs your body's ability to heal surgical and traumatic wounds but also impairs bone healing.  Wounds and bone heal by forming microscopic blood vessels (angiogenesis) and nicotine is a vasoconstrictor (essentially, shrinks blood vessels).  Therefore, if vasoconstriction occurs to these microscopic blood vessels they essentially disappear and are unable to deliver necessary nutrients to the healing tissue.  This is one modifiable factor that you can do to dramatically increase your chances of healing your injury.    (This means no smoking, no nicotine gum, patches, etc)  DO NOT USE NONSTEROIDAL ANTI-INFLAMMATORY DRUGS (NSAID'S)  Using products such as Advil (ibuprofen), Aleve (naproxen), Motrin (ibuprofen) for additional pain control during fracture healing can delay and/or prevent the healing response.  If you would like to take over the counter (OTC) medication, Tylenol (acetaminophen) is ok.  However, some narcotic medications that are given for pain control contain acetaminophen as well. Therefore, you should not exceed more than 4000 mg of tylenol in a day if you do not have liver disease.  Also note that there are may OTC medicines, such as cold medicines and allergy medicines that my contain  tylenol as well.  If you have any questions about medications and/or interactions please ask your doctor/PA or your pharmacist.   PAIN MEDICATION USE AND EXPECTATIONS  You have likely been given narcotic medications to help control your pain.  After a traumatic event that results in an fracture (broken bone) with or without surgery, it is ok to use narcotic pain medications to help control one's pain.  We understand that everyone responds to pain differently and each individual patient will be evaluated on a regular basis for the continued need for narcotic medications. Ideally, narcotic medication use should last no more than 6-8 weeks (coinciding with fracture healing).   As a patient it is your responsibility as well to monitor narcotic medication use and report the amount and frequency you use these medications when you come to your office visit.   We would also advise that if you are using narcotic medications, you should take a dose prior to therapy to maximize you participation.  IF YOU ARE ON NARCOTIC MEDICATIONS IT IS NOT PERMISSIBLE TO OPERATE A MOTOR VEHICLE (MOTORCYCLE/CAR/TRUCK/MOPED) OR HEAVY MACHINERY DO NOT MIX NARCOTICS WITH OTHER CNS (CENTRAL NERVOUS SYSTEM) DEPRESSANTS SUCH AS ALCOHOL       ICE AND ELEVATE INJURED/OPERATIVE EXTREMITY  Using ice and elevating the injured extremity above your heart can help with swelling and pain control.  Icing in a pulsatile fashion, such as 20 minutes on and 20 minutes off, can be followed.    Do not place ice directly on skin. Make sure there is a barrier between to skin and the ice pack.    Using frozen items such as frozen peas works well as  the conform nicely to the are that needs to be iced.  USE AN ACE WRAP OR TED HOSE FOR SWELLING CONTROL  In addition to icing and elevation, Ace wraps or TED hose are used to help limit and resolve swelling.  It is recommended to use Ace wraps or TED hose until you are informed to stop.    When using Ace  Wraps start the wrapping distally (farthest away from the body) and wrap proximally (closer to the body)   Example: If you had surgery on your leg or thing and you do not have a splint on, start the ace wrap at the toes and work your way up to the thigh        If you had surgery on your upper extremity and do not have a splint on, start the ace wrap at your fingers and work your way up to the upper arm  IF YOU ARE IN A SPLINT OR CAST DO NOT [place]   If your splint gets wet for any reason please contact the office immediately. You may shower in your splint or cast as long as you keep it dry.  This can be done by wrapping in a cast cover or garbage back (or similar)  Do Not stick any thing down your splint or cast such as pencils, money, or hangers to try and scratch yourself with.  If you feel itchy take benadryl as prescribed on the bottle for itching  IF YOU ARE IN A CAM BOOT (BLACK BOOT)  You may remove boot periodically. Perform daily dressing changes as noted below.  Wash the liner of the boot regularly and wear a sock when wearing the boot. It is recommended that you sleep in the boot until told otherwise  CALL THE OFFICE WITH ANY QUESTIONS OR CONCERTS: [phone]     Discharge Pin Site Instructions  Dress pins daily with Kerlix roll starting on POD 2. Wrap the Kerlix so that it tamps the skin down around the pin-skin interface to prevent/limit motion of the skin relative to the pin.  (Pin-skin motion is the primary cause of pain and infection related to external fixator pin sites).  Remove any crust or coagulum that may obstruct drainage with a saline moistened gauze or soap and water.  After POD 3, if there is no discernable drainage on the pin site dressing, the interval for change can by increased to every other day.  You may shower with the fixator, cleaning all pin sites gently with soap and water.  If you have a surgical wound this needs to be completely dry and  without drainage before showering.  The extremity can be lifted by the fixator to facilitate wound care and transfers.  Notify the office/Doctor if you experience increasing drainage, redness, or pain from a pin site, or if you notice purulent (thick, snot-like) drainage.  Discharge Wound Care Instructions  Do NOT apply any ointments, solutions or lotions to pin sites or surgical wounds.  These prevent needed drainage and even though solutions like hydrogen peroxide kill bacteria, they also damage cells lining the pin sites that help fight infection.  Applying lotions or ointments can keep the wounds moist and can cause them to breakdown and open up as well. This can increase the risk for infection. When in doubt call the office.  Surgical incisions should be dressed daily.  If any drainage is noted, use one layer of adaptic, then gauze, Kerlix, and an ace wrap.  Once the incision is completely dry and without drainage, it may be left open to air out.  Showering may begin 36-48 hours later.  Cleaning gently with soap and water.  Traumatic wounds should be dressed daily as well.    One layer of adaptic, gauze, Kerlix, then ace wrap.  The adaptic can be discontinued once the draining has ceased    If you have a wet to dry dressing: wet the gauze with saline the squeeze as much saline out so the gauze is moist (not soaking wet), place moistened gauze over wound, then place a dry gauze over the moist one, followed by Kerlix wrap, then ace wrap.

## 2013-10-30 NOTE — Progress Notes (Signed)
Wears glasses

## 2013-10-30 NOTE — Progress Notes (Signed)
Patient seems depressed, flat affect. States he is used to being independent. Encouraged patient to talk about his feelings.

## 2013-10-30 NOTE — Clinical Social Work Psychosocial (Signed)
Note reviewed and approved as written.  Davied Nocito Givens, MSW, LCSW [phone]

## 2013-10-30 NOTE — Clinical Social Work Placement (Addendum)
Clinical Social Work Department CLINICAL SOCIAL WORK PLACEMENT NOTE 10/30/2013  Patient:  Kevin Kim, Kevin Kim  Account Number:  [id number] Admit date:  10/24/2013  Clinical Social Worker:  Delrae Sawyers  Date/time:  10/30/2013 01:48 PM  Clinical Social Work is seeking post-discharge placement for this patient at the following level of care:   [place]   (*CSW will update this form in Epic as items are completed)   10/29/2013  Patient/family provided with [place] Department of Clinical Social Work's list of facilities offering this level of care within the geographic area requested by the patient (or if unable, by the patient's family).  10/29/2013  Patient/family informed of their freedom to choose among providers that offer the needed level of care, that participate in Medicare, Medicaid or managed care program needed by the patient, have an available bed and are willing to accept the patient.  10/29/2013  Patient/family informed of [facility]' ownership interest in [facility], as well as of the fact that they are under no obligation to receive care at this facility.  PASARR submitted to EDS on 10/29/2013 PASARR number received on 10/29/2013  FL2 transmitted to all facilities in geographic area requested by pt/family on  10/29/2013 FL2 transmitted to all facilities within larger geographic area on   Patient informed that his/her managed care company has contracts with or will negotiate with  certain facilities, including the following:     Patient/family informed of bed offers received:  10/30/2013 Patient chooses bed at [facility] Physician recommends and patient chooses bed at    Patient to be transferred to [facility] on  11/02/2013 Patient to be transferred to facility by [facility] Patient and family notified of transfer on 11/02/2013 Name of family member notified:  Pt's daughter, Kevin Kim.  The following physician request were entered in  Epic:   Additional Comments:  H[place] [phone]) Licensed Clinical Social Worker Orthopedics [phone]) and Surgical [phone])

## 2013-10-30 NOTE — Care Management Note (Signed)
CARE MANAGEMENT NOTE 10/30/2013  Patient:  Kevin Kim, Kevin Kim   Account Number:  [id number]  Date Initiated:  10/28/2013  Documentation initiated by:  Ricki Miller  Subjective/Objective Assessment:   68 yr old male admitted s/p fall with a right distal tibia fracture. Patient underwent right Tibia ORIF with external fixator and wound vac placement.     Action/Plan:   Case manager spoke with patient's daughter concerning home health needs at discharge. Referral called to Christa See, [facility] Liaison.   Anticipated DC Date:  10/30/2013   Anticipated DC Plan:  SKILLED NURSING FACILITY  In-house referral  Clinical Social Worker      DC Forensic scientist  CM consult      [facility] Choice  HOME HEALTH   Choice offered to / List presented to:  C-4 Adult Children   DME arranged  VAC        HH arranged  HH-1 RN      Kevin Kim   Status of service:  In process, will continue to follow Medicare Important Message given?  YES (If response is "NO", the following Medicare IM given date fields will be blank) Date Medicare IM given:  10/28/2013 Medicare IM given by:  Ricki Miller Date Additional Medicare IM given:  10/30/2013 Additional Medicare IM given by:  Ricki Miller  Discharge Disposition:  [place]  Per UR Regulation:  Reviewed for med. necessity/level of care/duration of stay  If discussed at [place] of Stay Meetings, dates discussed:   10/29/2013    Comments:  10/29/13 11:00am Ricki Miller, RN BSN Case Manager Patient will require shortterm SNF for rehab. He is NWB and lives alone. Daughter and son supportive but can not provide 24/7 care.

## 2013-10-30 NOTE — Progress Notes (Signed)
Decreased ROM RLE due to fx and repair

## 2013-10-30 NOTE — Discharge Summary (Signed)
Orthopaedic Trauma Service (OTS)  Patient ID: Kevin Kim MRN: [id number] DOB/AGE: 67/07/1945 67 y.o.  Admit date: 10/24/2013 Discharge date: 11/02/2013  Admission Diagnoses: Fall from ladder Open right distal tibia and fibula fracture, tibial plafond fracture GERD  Discharge Diagnoses:  Principal Problem:   Open fracture of tibial plafond with fibula involvement Active Problems:   GERD (gastroesophageal reflux disease)   Fall from ladder   Acute urinary retention   Procedures Performed: 10/24/2013- Dr. Alvan Dame 1. Both excisional and nonexcisional debridement of the right leg     wound, excisional debridement including sharp excision of skin and     subcutaneous tissue, bone fragments.  This was followed by non-     excisional debridement with 2 L normal saline solution irrigation. 2. Closed reduction and application of the Zimmer external fixator,     spanning external fixator. 3. Examination under fluoro  10/27/2013- Dr. Marcelino Scot 1. Open reduction and internal fixation of right tibial pilon, tibia     only. 2. Revision external fixation of right lower leg. 3. Irrigation and debridement of open fracture including bone. 4. Application of wound VAC small right leg    Discharged Condition: good  Hospital Course:   Patient is a very pleasant 68 year old white male who sustained a fall off a ladder on 10/24/2013. Patient sustained an open fracture to his right lower leg. He was brought to [facility]. His Only Injury Was Open Fracture to His Right Lower Leg. He Was Taken Emergently to the Operating Room for Irrigation and Debridement of His Open Wound As Well As Spanning External Fixation of His Right Ankle. Follow-Up CT Scan Was Obtained Which Demonstrated a Severely Comminuted Distal Tibia with Significant Joint Involvement. The Orthopedic Trauma Service Was Consultation regarding Definitive Treatment. Patient Was Seen and Evaluated by the Orthopedic Trauma Service.  He Was Taken Back to the Operating Room on 10/27/2013 for External Fixator Revision As Well As Repeat Irrigation and Debridement of His Wounds and Limited Internal Fixation of His Fracture. Due To His Open Wound Location It Prevented Both an Anterior Lateral or Medial Approach to Utilize Plate Fixation Technique. Thus It Was Decided That Limited Percutaneous Fixation Would Be the Best Option. We also placed a wound VAC on his traumatic wound to facilitate healing as well. After surgery he was taken back to the orthopedic floor for continued observation, pain control and to begin therapies. Patient progressed well over the next several days. Patient does live alone but does have adult children nearby but still is felt that he will require a nursing facility for his needs.  Wound VAC was removed on postoperative day #3 and a dressing consisting of Adaptic, 4 x 4's, Kerlix and Ace wrap was applied. We did contact Dr. Bonnita Nasuti at [facility] to initiate consult for follow-up given the very distal nature of this traumatic wound which may necessitate a free flap in the future if soft tissue dies. Patient was slow to mobilize postoperatively and had only really work with therapy 2 times from surgery with minimal activity. As such we felt it would be in patient's best interest to keep him over the weekend and to work with therapies over the weekend prior to discharge to the nursing home. The patient did very well over the weekend and was able to tolerate more activity with therapy. He was deemed to be stable for discharge to nursing home on postoperative day #6. Wound was checked again prior to discharge and new  dressing applied. Wound is stable without any signs of active infection.   Additionally, prior to the second surgical procedure a Foley catheter was placed which yielded about 1200 mL of clear urine. Given the volume we kept Foley in place after surgery. We started the patient on Flomax as well. Given the amount  of distention to the bladder we felt that removal of the Foley would not be warranted as patient likely be unable to void. We did contact [facility] and I spoke to Dr. Matilde Sprang who agreed with the plan to keep Foley in place for at least another week and follow-up as an outpatient in the urology office.we also did start the patient on Cipro for prophylaxis while his Foley remained in.   Patient was covered with Lovenox for DVT and PE prophylaxis following surgery as well. He will be on this for 3 weeks total.  Consults: urology  Significant Diagnostic Studies: labs:  Results for LEODIS, ALCOCER (MRN [id number]) as of 10/30/2013 10:45  Ref. Range 10/30/2013 06:05  Sodium Latest Range: 137-147 mEq/L 139  Potassium Latest Range: 3.7-5.3 mEq/L 4.4  Chloride Latest Range: 96-112 mEq/L 101  CO2 Latest Range: 19-32 mEq/L 26  BUN Latest Range: 6-23 mg/dL 12  Creatinine Latest Range: 0.50-1.35 mg/dL 0.75  Calcium Latest Range: 8.4-10.5 mg/dL 8.4  GFR calc non Af Amer Latest Range: >90 mL/min >90  GFR calc Af Amer Latest Range: >90 mL/min >90  Glucose Latest Range: 70-99 mg/dL 118 (H)  Anion gap Latest Range: 5-15  12  WBC Latest Range: 4.0-10.5 K/uL 8.5  RBC Latest Range: 4.22-5.81 MIL/uL 3.41 (L)  Hemoglobin Latest Range: 13.0-17.0 g/dL 11.1 (L)  HCT Latest Range: 39.0-52.0 % 33.2 (L)  MCV Latest Range: 78.0-100.0 fL 97.4  MCH Latest Range: 26.0-34.0 pg 32.6  MCHC Latest Range: 30.0-36.0 g/dL 33.4  RDW Latest Range: 11.5-15.5 % 12.4  Platelets Latest Range: 150-400 K/uL 215   Results for VAHAN, WADSWORTH (MRN [id number]) as of 10/30/2013 10:45  Ref. Range 10/27/2013 22:50  Color, Urine Latest Range: YELLOW  YELLOW  APPearance Latest Range: CLEAR  CLEAR  Specific Gravity, Urine Latest Range: 1.005-1.030  1.008  pH Latest Range: 5.0-8.0  6.5  Glucose Latest Range: NEGATIVE mg/dL NEGATIVE  Bilirubin Urine Latest Range: NEGATIVE  NEGATIVE  Ketones, ur Latest Range: NEGATIVE  mg/dL NEGATIVE  Protein Latest Range: NEGATIVE mg/dL NEGATIVE  Urobilinogen, UA Latest Range: 0.0-1.0 mg/dL 0.2  Nitrite Latest Range: NEGATIVE  NEGATIVE  Leukocytes, UA Latest Range: NEGATIVE  NEGATIVE  Hgb urine dipstick Latest Range: NEGATIVE  NEGATIVE    Results for ASBERRY, LASCOLA (MRN [id number]) as of 10/30/2013 10:45  Ref. Range 10/27/2013 06:32  Vit D, 25-Hydroxy Latest Range: 30-89 ng/mL 43    Treatments: IV hydration, antibiotics: Ancef, analgesia: Dilaudid, oxy IR, percocet, Lyrica, anticoagulation: LMW heparin, therapies: PT, OT, RN and SW and surgery: as above   Discharge Exam:      Orthopaedic Trauma Service Progress Note  Subjective  Patient is doing well Worked with therapy over the weekend and did better. Did get somewhat lightheaded, patient has had this issue chronically his entire life. No other complaints Ready to go to nursing home today Pain is stable and current medications are helping  Foley remains in place   Review of Systems  Constitutional: Negative for fever and chills.  Respiratory: Negative for shortness of breath and wheezing.   Cardiovascular: Negative for chest pain and palpitations.  Gastrointestinal: Negative for heartburn, nausea, vomiting and  abdominal pain.  Genitourinary:        Foley  Neurological: Negative for tingling and sensory change.     Objective   BP 133/66 mmHg  Pulse 72  Temp(Src) 99.2 F (37.3 C) (Oral)  Resp 18  Ht 5\' 8"  (1.727 m)  Wt 132.45 kg (292 lb)  BMI 44.41 kg/m2  SpO2 98%  Intake/Output       11/01 0701 - 11/02 0700 11/02 0701 - 11/03 0700    P.O. 640     Total Intake(mL/kg) 640 (4.8)     Urine (mL/kg/hr) 2600 (0.8)     Total Output 2600      Net -1960               Stool Occurrence 1 x       Labs No new labs  Exam  Gen: resting comfortably in bed, NAD Lungs: clear B Cardiac: RRR, s1 and s2 Abd: + BS, NTND, soft Ext:        Right Lower Extremity               Ex fix stable              pinsites look great            dressing change this morning             Wound is stable, edges are still approximated             No active drainage             Proximal edges are dark but wound appears stable             Overall color does appear to be better to the soft tissue of the traumatic wound             No signs of infection                Ext warm             + DP pulse             Swelling controlled             Distal motor and sensory functions intact            Assessment and Plan   POD/HD#: 60   68 y/o male s/p fall off ladder with open severely comminuted R pilon fracture and comminuted R fibula fracture  1. Fall off ladder  2. Open comminuted R pilon and fibula fxs s/p repeat I&D, ex fix revision and limited percutaneous fixation                          NWB x 8 weeks             Toe and knee motion as tolerated             PT/OT evals             Ice and elevate               Wound dressed with adaptic, 4x4's, kerlix and ace. Dressing changes every other day                         Adaptic, 4x4's, kerlix and ace  Discussed with pt the possibility of referral to [facility] given distal nature of wound   3. Pain management             Continue with current regimen  4. ABL anemia/Hemodynamics             Stable  5. Acute Urinary retention             continue with foley               Continue with flomax as well               U/A negative               Follow up with [facility] on 11/04/2013             Cipro  For prophylaxis  6. DVT/PE prophylaxis:             lovenox x 3 weeks  7. ID:               Completed abx for open fx treatment             cipro for retained foley   8. Activity:             OOB as tolerated             PT/OT   9. FEN/Foley/Lines:             Continue with foley- DO NOT REMOVE                10.Ex-fix/Splint care:             Ok to lift extremity by fixator              daily pincare- wash with soap and water   11. Impediments to fracture healing:             Open fracture                12. Dispo:             continue with PT/OT             SNF when bed available                Follow up with urology in 3 days               Follow up with OTS in 1 week             Make follow up with [facility] for later this week or early next week     Jari Pigg, PA-C [facility] [phone] (O) 11/02/2013 8:43 AM   Disposition: SNF      Discharge Instructions    Apply ice to affected area    Complete by:  As directed      Call MD / Call 911    Complete by:  As directed   If you experience chest pain or shortness of breath, CALL 911 and be transported to the hospital emergency room.  If you develope a fever above 101 F, pus (white drainage) or increased drainage or redness at the wound, or calf pain, call your surgeon's office.     Constipation Prevention    Complete by:  As directed   Drink plenty of fluids.  Prune juice may be helpful.  You may use a stool softener, such as Colace (over the  counter) 100 mg twice a day.  Use MiraLax (over the counter) for constipation as needed.     Diet general    Complete by:  As directed      Discharge instructions    Complete by:  As directed   Orthopaedic Trauma Service Discharge Instructions   General Discharge Instructions  WEIGHT BEARING STATUS: Nonweightbearing R leg  RANGE OF MOTION/ACTIVITY: No ROM R ankle, ok to move toes and knee   Wound Care: daily dressing changes starting on 11/01/2013 to R ankle- adaptic, 4x4s, kerlix, ace. Do not use xeroform or vaseline gauze. Daily pincare. Clean with soap and water. Dress pins with kerlix  Diet: as you were eating previously.  Can use over the counter stool softeners and bowel preparations, such as Miralax, to help with bowel movements.  Narcotics can be constipating.  Be sure to drink plenty of fluids  STOP SMOKING OR USING  NICOTINE PRODUCTS!!!!  As discussed nicotine severely impairs your body's ability to heal surgical and traumatic wounds but also impairs bone healing.  Wounds and bone heal by forming microscopic blood vessels (angiogenesis) and nicotine is a vasoconstrictor (essentially, shrinks blood vessels).  Therefore, if vasoconstriction occurs to these microscopic blood vessels they essentially disappear and are unable to deliver necessary nutrients to the healing tissue.  This is one modifiable factor that you can do to dramatically increase your chances of healing your injury.    (This means no smoking, no nicotine gum, patches, etc)  DO NOT USE NONSTEROIDAL ANTI-INFLAMMATORY DRUGS (NSAID'S)  Using products such as Advil (ibuprofen), Aleve (naproxen), Motrin (ibuprofen) for additional pain control during fracture healing can delay and/or prevent the healing response.  If you would like to take over the counter (OTC) medication, Tylenol (acetaminophen) is ok.  However, some narcotic medications that are given for pain control contain acetaminophen as well. Therefore, you should not exceed more than 4000 mg of tylenol in a day if you do not have liver disease.  Also note that there are may OTC medicines, such as cold medicines and allergy medicines that my contain tylenol as well.  If you have any questions about medications and/or interactions please ask your doctor/PA or your pharmacist.   PAIN MEDICATION USE AND EXPECTATIONS  You have likely been given narcotic medications to help control your pain.  After a traumatic event that results in an fracture (broken bone) with or without surgery, it is ok to use narcotic pain medications to help control one's pain.  We understand that everyone responds to pain differently and each individual patient will be evaluated on a regular basis for the continued need for narcotic medications. Ideally, narcotic medication use should last no more than 6-8 weeks (coinciding with  fracture healing).   As a patient it is your responsibility as well to monitor narcotic medication use and report the amount and frequency you use these medications when you come to your office visit.   We would also advise that if you are using narcotic medications, you should take a dose prior to therapy to maximize you participation.  IF YOU ARE ON NARCOTIC MEDICATIONS IT IS NOT PERMISSIBLE TO OPERATE A MOTOR VEHICLE (MOTORCYCLE/CAR/TRUCK/MOPED) OR HEAVY MACHINERY DO NOT MIX NARCOTICS WITH OTHER CNS (CENTRAL NERVOUS SYSTEM) DEPRESSANTS SUCH AS ALCOHOL       ICE AND ELEVATE INJURED/OPERATIVE EXTREMITY  Using ice and elevating the injured extremity above your heart can help with swelling and pain control.  Icing in a pulsatile fashion, such as  20 minutes on and 20 minutes off, can be followed.    Do not place ice directly on skin. Make sure there is a barrier between to skin and the ice pack.    Using frozen items such as frozen peas works well as the conform nicely to the are that needs to be iced.  USE AN ACE WRAP OR TED HOSE FOR SWELLING CONTROL  In addition to icing and elevation, Ace wraps or TED hose are used to help limit and resolve swelling.  It is recommended to use Ace wraps or TED hose until you are informed to stop.    When using Ace Wraps start the wrapping distally (farthest away from the body) and wrap proximally (closer to the body)   Example: If you had surgery on your leg or thing and you do not have a splint on, start the ace wrap at the toes and work your way up to the thigh        If you had surgery on your upper extremity and do not have a splint on, start the ace wrap at your fingers and work your way up to the upper arm  IF YOU ARE IN A SPLINT OR CAST DO NOT [place]   If your splint gets wet for any reason please contact the office immediately. You may shower in your splint or cast as long as you keep it dry.  This can be done by wrapping in a cast  cover or garbage back (or similar)  Do Not stick any thing down your splint or cast such as pencils, money, or hangers to try and scratch yourself with.  If you feel itchy take benadryl as prescribed on the bottle for itching  IF YOU ARE IN A CAM BOOT (BLACK BOOT)  You may remove boot periodically. Perform daily dressing changes as noted below.  Wash the liner of the boot regularly and wear a sock when wearing the boot. It is recommended that you sleep in the boot until told otherwise  CALL THE OFFICE WITH ANY QUESTIONS OR CONCERTS: [phone]     Discharge Pin Site Instructions  Dress pins daily with Kerlix roll starting on POD 2. Wrap the Kerlix so that it tamps the skin down around the pin-skin interface to prevent/limit motion of the skin relative to the pin.  (Pin-skin motion is the primary cause of pain and infection related to external fixator pin sites).  Remove any crust or coagulum that may obstruct drainage with a saline moistened gauze or soap and water.  After POD 3, if there is no discernable drainage on the pin site dressing, the interval for change can by increased to every other day.  You may shower with the fixator, cleaning all pin sites gently with soap and water.  If you have a surgical wound this needs to be completely dry and without drainage before showering.  The extremity can be lifted by the fixator to facilitate wound care and transfers.  Notify the office/Doctor if you experience increasing drainage, redness, or pain from a pin site, or if you notice purulent (thick, snot-like) drainage.  Discharge Wound Care Instructions  Do NOT apply any ointments, solutions or lotions to pin sites or surgical wounds.  These prevent needed drainage and even though solutions like hydrogen peroxide kill bacteria, they also damage cells lining the pin sites that help fight infection.  Applying lotions or ointments can keep the wounds moist and can cause them  to breakdown and  open up as well. This can increase the risk for infection. When in doubt call the office.  Surgical incisions should be dressed daily.  If any drainage is noted, use one layer of adaptic, then gauze, Kerlix, and an ace wrap.  Once the incision is completely dry and without drainage, it may be left open to air out.  Showering may begin 36-48 hours later.  Cleaning gently with soap and water.  Traumatic wounds should be dressed daily as well.    One layer of adaptic, gauze, Kerlix, then ace wrap.  The adaptic can be discontinued once the draining has ceased    If you have a wet to dry dressing: wet the gauze with saline the squeeze as much saline out so the gauze is moist (not soaking wet), place moistened gauze over wound, then place a dry gauze over the moist one, followed by Kerlix wrap, then ace wrap.     Discharge wound care:    Complete by:  As directed   Wound Care: dressing changes every other day starting on 11/04/2013 to R ankle- adaptic, 4x4s, kerlix, ace. Do not use xeroform or vaseline gauze. Daily pincare. Clean with soap and water. Dress pins with kerlix     Driving restrictions    Complete by:  As directed   No driving     Elevate extremity    Complete by:  As directed      Increase activity slowly as tolerated    Complete by:  As directed      Non weight bearing    Complete by:  As directed             Medication List    TAKE these medications        aspirin 81 MG chewable tablet  Chew 81 mg by mouth every morning.     ciprofloxacin 500 MG tablet  Commonly known as:  CIPRO  Take 1 tablet (500 mg total) by mouth 2 (two) times daily.     DSS 100 MG Caps  Take 100 mg by mouth 2 (two) times daily.     enoxaparin 40 MG/0.4ML injection  Commonly known as:  LOVENOX  Inject 0.4 mLs (40 mg total) into the skin daily.     hydrOXYzine 50 MG tablet  Commonly known as:  ATARAX/VISTARIL  Take 1 tablet (50 mg total) by mouth 3 (three) times daily as needed for itching,  anxiety or nausea.     lansoprazole 30 MG capsule  Commonly known as:  PREVACID  Take 30 mg by mouth every morning.     methocarbamol 500 MG tablet  Commonly known as:  ROBAXIN  Take 1-2 tablets (500-1,000 mg total) by mouth every 6 (six) hours as needed for muscle spasms.     multivitamins ther. w/minerals Tabs tablet  Take 1 tablet by mouth every morning. Centrum Silver     oxyCODONE 5 MG immediate release tablet  Commonly known as:  Oxy IR/ROXICODONE  Take 1-2 tablets (5-10 mg total) by mouth every 6 (six) hours as needed for breakthrough pain (take between percocet for breakthrough pain).     oxyCODONE-acetaminophen 5-325 MG per tablet  Commonly known as:  PERCOCET/ROXICET  Take 1-2 tablets by mouth every 6 (six) hours as needed for moderate pain or severe pain.     pregabalin 75 MG capsule  Commonly known as:  LYRICA  Take 1 capsule (75 mg total) by mouth 2 (two) times daily.  tamsulosin 0.4 MG Caps capsule  Commonly known as:  FLOMAX  Take 1 capsule (0.4 mg total) by mouth daily after supper.     Vitamin D (Ergocalciferol) 50000 UNITS Caps capsule  Commonly known as:  DRISDOL  Take 50,000 Units by mouth once a week. On Fridays       Follow-up Information    Follow up with Rene Paci, MD. Schedule an appointment as soon as possible for a visit in 7 days.   Specialty:  Plastic Surgery   Why:  For wound re-check   Contact information:   [place] [address] [phone]       Follow up with Rozanna Box, MD On 11/09/2013.   Specialty:  Orthopedic Surgery   Why:  For wound re-check   Contact information:   [address] [place] [address] [phone]       Follow up with MACDIARMID,SCOTT A, MD. Go on 11/04/2013.   Specialty:  Urology   Why:  foley evaluation.  Inform the office that the orthopaedic PA and urologist spoke on phone and this appointment is to be set up    Contact information:   [place] [address] [phone]       Discharge Instructions and Plan:  Patient sustained a very severe injury to his right distal tibia and fibula which is intra-articular. This is further complicated by the fact that it was an open injury. In the location of the open wound did not enable less to consider formal plate osteosynthesis even after allowing adequate soft tissue swelling resolution. We were able to achieve excellent fixation and alignment utilizing external fixator as well as limited percutaneous fixation. Patient did sustain significant injury to the joint surfaces well and is at increased risk for the development of posttraumatic arthritis. We will have to keep a close eye on his open wound and the healing progress. It is still too early to say whether or not his wound will heal and as such we will refer him to do plastics, Dr. Bonnita Nasuti for evaluation and to have additional monitoring of his wound.  Patient will be nonweightbearing for the next 8-12 weeks. He will remain his fixator for about 8 weeks as well. He is encouraged to perform toe range of motion and knee range of motion as tolerated  wound care starting on 11/04/2013. Adaptic, 4 x 4, Kerlix and Ace wrap under the ex fix. No Xeroform or Vaseline gauze.dressing changes should be performed every other day. No lotions or solutions needed to clean the wound. Only soap and water. Dry real well. Daily pin care can be performed as well utilizing soap and water to clean the pin sites and dressing the pin sites with Kerlix  Patient will remain on Lovenox for the next 3 weeks for DVT and PE prophylaxis  Patient does have acute urinary retention. Patient will be discharged with his Foley catheter. He is to follow-up early next week with [facility] to evaluate this. There is no evidence of urinary tract infection as a cause for his acute urinary retention. He was started on Flomax postoperatively as well. I would defer  further treatment to urology. I've contacted neurology to obtain an appointment for the patient. I am awaiting return phone call as a patient needs to be worked into the schedule  Patient is to follow-up with orthopedics early next week and will see Dr. Bonnita Nasuti at some point this week or  early next week as well.  Please contact the office with any questions or concerns including wound care, therapies, medications etc. [phone]  Signed:  Jari Pigg, PA-C [facility] [phone] (P) 11/02/2013, 11:18 AM

## 2013-10-30 NOTE — Clinical Social Work Note (Addendum)
UPDATE 4:06pm CSW received call from PA. Pt will not be discharging on 10/30/2013, potential discharge for Monday 11/02/2013. CSW has updated [employer] regarding change in discharge.  Lubertha Sayres, [place] [phone]) Licensed Clinical Social Worker Orthopedics [phone]) and Surgical [phone])        UPDATE 3:50pm Discharge transportation cancelled per RN due to pt's family requesting to speak with MD prior to dc. RN has notified MD. RN to please call for EMS [facility], [phone]) once pt cleared by MD for discharge.   Lubertha Sayres, LCSWA [phone]) Licensed Clinical Social Worker Orthopedics [phone]) and Surgical [phone])      Pt to be discharged to [facility]. Pt's daughter, Minette Headland, updated regarding discharge.  [facility]: [phone] Transportation: EMS [facility]) scheduled for 2:30pm.  Lubertha Sayres, Latanya Presser [phone]) Licensed Clinical Social Worker Orthopedics [phone]) and Surgical [phone])

## 2013-10-30 NOTE — Progress Notes (Signed)
External fixator right leg

## 2013-10-30 NOTE — Progress Notes (Signed)
Orthopaedic Trauma Service Progress Note  Subjective  Doing ok Pain gradually improving No new issues noted this am + flatus  Foley doing ok    Review of Systems  Constitutional: Negative for fever and chills.  Eyes: Negative for blurred vision.  Respiratory: Negative for shortness of breath and wheezing.   Cardiovascular: Negative for chest pain and palpitations.  Neurological: Negative for tingling and sensory change.     Objective   BP 101/64  Pulse 85  Temp(Src) 98.1 F (36.7 C) (Oral)  Resp 16  Ht 5\' 8"  (1.727 m)  Wt 132.45 kg (292 lb)  BMI 44.41 kg/m2  SpO2 97%  Intake/Output     10/29 0701 - 10/30 0700 10/30 0701 - 10/31 0700   P.O. 600 200   I.V. (mL/kg) 447.3 (3.4)    IV Piggyback 240    Total Intake(mL/kg) 1287.3 (9.7) 200 (1.5)   Urine (mL/kg/hr) 2900 (0.9)    Drains  150 (0.4)   Total Output 2900 150   Net -1612.7 +50          Labs Results for Kevin, Kim (MRN [id number]) as of 10/30/2013 09:42  Ref. Range 10/30/2013 06:05  Sodium Latest Range: 137-147 mEq/L 139  Potassium Latest Range: 3.7-5.3 mEq/L 4.4  Chloride Latest Range: 96-112 mEq/L 101  CO2 Latest Range: 19-32 mEq/L 26  BUN Latest Range: 6-23 mg/dL 12  Creatinine Latest Range: 0.50-1.35 mg/dL 0.75  Calcium Latest Range: 8.4-10.5 mg/dL 8.4  GFR calc non Af Amer Latest Range: >90 mL/min >90  GFR calc Af Amer Latest Range: >90 mL/min >90  Glucose Latest Range: 70-99 mg/dL 118 (H)  Anion gap Latest Range: 5-15  12  WBC Latest Range: 4.0-10.5 K/uL 8.5  RBC Latest Range: 4.22-5.81 MIL/uL 3.41 (L)  Hemoglobin Latest Range: 13.0-17.0 g/dL 11.1 (L)  HCT Latest Range: 39.0-52.0 % 33.2 (L)  MCV Latest Range: 78.0-100.0 fL 97.4  MCH Latest Range: 26.0-34.0 pg 32.6  MCHC Latest Range: 30.0-36.0 g/dL 33.4  RDW Latest Range: 11.5-15.5 % 12.4  Platelets Latest Range: 150-400 K/uL 215     Exam  Gen: resting comfortably in bed, NAD Lungs: clear B Cardiac: RRR, s1 and s2 Abd: +  BS, NTND, soft Ext:       Right Lower Extremity   Ex fix stable  pinsites look great  VAC removed  Wound is stable  No active drainage  Proximal edges are dark but wound appears stable  No signs of infection               Ext warm             + DP pulse             Swelling controlled             Distal motor and sensory functions intact             Assessment and Plan   POD/HD#: 55   68 y/o male s/p fall off ladder with open severely comminuted R pilon fracture and comminuted R fibula fracture  1. Fall off ladder  2. Open comminuted R pilon and fibula fxs s/p repeat I&D, ex fix revision and limited percutaneous fixation                          NWB x 8 weeks             Toe and knee motion as tolerated  PT/OT evals             Ice and elevate             VAC dc'd  Wound dressed with adaptic, 4x4's, kerlix and ace. Would like to see in office on Monday for dressing change if discharged over weekend    pinsites redressed as well   Discussed with pt the possibility of referral to [facility] given distal nature of wound   3. Pain management             Continue with current regimen  4. ABL anemia/Hemodynamics             Stable  5. Acute Urinary retention             continue with foley until next week               Continue with flomax as well               U/A negative   [facility] with [facility] on 11/04/2013  6. DVT/PE prophylaxis:             lovenox x 3 weeks  7. ID:               Completed abx for open fx treatment  8. Activity:             OOB as tolerated             PT/OT evals  9. FEN/Foley/Lines:             Continue with foley- DO NOT REMOVE                10.Ex-fix/Splint care:             Ok to lift extremity by fixator             daily [employer]- wash with soap and water   11. Impediments to fracture healing:             Open fracture                12. Dispo:             continue with PT/OT             SNF when  bed available     Follow up with ortho and urology in 3-5 days     Jari Pigg, PA-C [facility] [phone] (O) 10/30/2013 9:41 AM

## 2013-10-31 NOTE — Progress Notes (Signed)
Physical Therapy Treatment Patient Details Name: Kevin Kim MRN: [id number] DOB: Nov 15, 1945 Today's Date: 10/31/2013    History of Present Illness Pt fell from a ladder sustaining a R Tib/Fib open Fx. Pt with an external fixator and wound vac. (wound vac d/c on 10/30)    PT Comments    Patient able to get OOB today for first time. Patient anxious about getting out of bed and transfers.  Patient c/o dizziness and nausea with bed mobility and after transfer to chair. Able to maintain NWB Rt leg. Cont to recommend SNF for further mobility training  Follow Up Recommendations  SNF;Supervision for mobility/OOB     Equipment Recommendations  Rolling walker with 5" wheels;Wheelchair (measurements PT)    Recommendations for Other Services       Precautions / Restrictions Precautions Precautions: Fall Precaution Comments: external fixator, dizziness with sitting Restrictions Weight Bearing Restrictions: Yes RLE Weight Bearing: Non weight bearing    Mobility  Bed Mobility Overal bed mobility: Needs Assistance Bed Mobility: Supine to Sit     Supine to sit: Min guard     General bed mobility comments: able to lift right leg without assistance  Transfers Overall transfer level: Needs assistance Equipment used: Rolling walker (2 wheeled) Transfers: Sit to/from [facility] Sit to Stand: Min assist;+2 safety/equipment Stand pivot transfers: Min assist;+2 safety/equipment       General transfer comment: patient nervous about transfer to chair OOB  Ambulation/Gait Ambulation/Gait assistance:  (unable to ambulate secondary to lightheadedness/nausea)               Stairs            Wheelchair Mobility    Modified Rankin (Stroke Patients Only)       Balance Overall balance assessment: Needs assistance Sitting-balance support: No upper extremity supported Sitting balance-Leahy Scale: Fair Sitting balance - Comments: not formally tested  greater than fair rating                            Cognition Arousal/Alertness: Awake/alert Behavior During Therapy: WFL for tasks assessed/performed Overall Cognitive Status: Within Functional Limits for tasks assessed                      Exercises General Exercises - Lower Extremity Ankle Circles/Pumps: Left;Seated;5 reps (wiggled toes Rt leg)    General Comments General comments (skin integrity, edema, etc.): pt reports vagal response to pain, anxious about transfer and getting OOB today, pt nauseated after transfer to chair      Pertinent Vitals/Pain Pain Assessment: 0-10 Pain Score: 4  Pain Location: right lower leg Pain Descriptors / Indicators: Pressure Pain Intervention(s): Premedicated before session;Limited activity within patient's tolerance;Monitored during session;Repositioned    Home Living                      Prior Function            PT Goals (current goals can now be found in the care plan section) Acute Rehab PT Goals Patient Stated Goal: to get moving PT Goal Formulation: With patient/family Time For Goal Achievement: 11/04/13 Potential to Achieve Goals: Good Progress towards PT goals: Progressing toward goals    Frequency  Min 3X/week    PT Plan Current plan remains appropriate    Co-evaluation             End of Session Equipment Utilized During Treatment: Gait belt  Activity Tolerance: Other (comment) (limited secondary to nausea and lightheadedness) Patient left: in chair;with call bell/phone within reach     Time: 1204-1231 PT Time Calculation (min): 27 min  Charges:  $Gait Training: 8-22 mins $Therapeutic Activity: 8-22 mins                    G Codes:      Dorthie Santini 11/12/2013, 1:15 PM Bernita Buffy, [place]

## 2013-10-31 NOTE — Progress Notes (Addendum)
Subjective: 4 Days Post-Op Procedure(s) (LRB):   REVISION OF  EXTERNAL FIXATOR RIGHT LOWER LEG   ORIF  RIGHT TIBIAL PILON APPLICATION OF WOUND VAC (Right) Patient reports pain as 2 on 0-10 scale.  Pain slightly decreased since yesterday.  No nausea/vomiting, lightheadedness/dizziness.  Tolerating diet.  Objective: Vital signs in last 24 hours: Temp:  [98.3 F (36.8 C)-99.4 F (37.4 C)] 99.1 F (37.3 C) (10/31 0522) Pulse Rate:  [80-88] 80 (10/31 0522) Resp:  [16-18] 16 (10/31 0522) BP: (117-128)/(60-76) 128/60 mmHg (10/31 0522) SpO2:  [99 %-100 %] 99 % (10/31 0522)  Intake/Output from previous day: 10/30 0701 - 10/31 0700 In: 560 [P.O.:560] Out: 2150 [Urine:2000; Drains:150] Intake/Output this shift: Total I/O In: 240 [P.O.:240] Out: -    Recent Labs  10/30/13 0605  HGB 11.1*    Recent Labs  10/30/13 0605  WBC 8.5  RBC 3.41*  HCT 33.2*  PLT 215    Recent Labs  10/29/13 0520 10/30/13 0605  NA 137 139  K 4.1 4.4  CL 100 101  CO2 26 26  BUN 11 12  CREATININE 0.72 0.75  GLUCOSE 122* 118*  CALCIUM 8.5 8.4   No results found for this basename: LABPT, INR,  in the last 72 hours  Neurologically intact Neurovascular intact Sensation intact distally Intact pulses distally Compartment soft Patient able to wiggle toes Decreased swelling Wound stable No drainage, erythema, or exquisite ttp   Assessment/Plan: 4 Days Post-Op Procedure(s) (LRB):   REVISION OF  EXTERNAL FIXATOR RIGHT LOWER LEG   ORIF  RIGHT TIBIAL PILON APPLICATION OF WOUND VAC (Right) Advance diet Up with therapy Plan for d/c to [facility] on Monday NWB RLE Toe and knee motion are okay to do as tolerated Continue with foley catheter Daily pin care-wash with soap and water please  ANTON, M. LINDSEY 10/31/2013, 10:15 AM

## 2013-11-01 MED ORDER — METHOCARBAMOL 1000 MG/10ML IJ SOLN
1000.0000 mg | Freq: Four times a day (QID) | INTRAVENOUS | Status: DC | PRN
  Filled 2013-11-01: qty 10

## 2013-11-01 NOTE — Progress Notes (Signed)
Physical Therapy Treatment Patient Details Name: ROCKNEY GRENZ MRN: [id number] DOB: June 03, 1945 Today's Date: 11/01/2013    History of Present Illness Pt fell from a ladder sustaining a R Tib/Fib open Fx. Pt with an external fixator and wound vac. (wound vac d/c on 10/30)    PT Comments    Tolerated standing with RW slightly longer today, and we were able to work on standing and advancing LLE more; Better activity tolerance today, but continues to have what seems like a vasovagal response to pain and mobility  Follow Up Recommendations  SNF;Supervision for mobility/OOB     Equipment Recommendations  Rolling walker with 5" wheels;Wheelchair (measurements PT)    Recommendations for Other Services       Precautions / Restrictions Precautions Precautions: Fall Precaution Comments: external fixator, dizziness with sitting Restrictions Weight Bearing Restrictions: Yes RLE Weight Bearing: Non weight bearing    Mobility  Bed Mobility Overal bed mobility: Needs Assistance Bed Mobility: Supine to Sit     Supine to sit: Min guard     General bed mobility comments: able to lift right leg without assistance  Transfers Overall transfer level: Needs assistance Equipment used: Rolling walker (2 wheeled) Transfers: Sit to/from Stand Sit to Stand: Min assist;+2 safety/equipment         General transfer comment: patient nervous about transfer to chair OOB  Ambulation/Gait Ambulation/Gait assistance: +2 safety/equipment;Min guard Ambulation Distance (Feet):  (pivot "steps" on LLE bed to chair) Assistive device: Rolling walker (2 wheeled) Gait Pattern/deviations:  (Heel-toe pivot)     General Gait Details: verbal and demo cues for technique; goo maintenance of NWB RLE   Stairs            Wheelchair Mobility    Modified Rankin (Stroke Patients Only)       Balance     Sitting balance-Leahy Scale: Good       Standing balance-Leahy Scale: Poor                       Cognition Arousal/Alertness: Awake/alert Behavior During Therapy: WFL for tasks assessed/performed Overall Cognitive Status: Within Functional Limits for tasks assessed                      Exercises General Exercises - Lower Extremity Ankle Circles/Pumps:  (wiggled toes Rt leg)    General Comments General comments (skin integrity, edema, etc.): Pt reports vagal response to pain; reproted lightheadedness and feeling cold and clammy; this sensation resolved once in the chair      Pertinent Vitals/Pain Pain Assessment: Faces Faces Pain Scale: Hurts little more Pain Location: RLE Pain Descriptors / Indicators: Pressure Pain Intervention(s): Premedicated before session;Repositioned    Home Living                      Prior Function            PT Goals (current goals can now be found in the care plan section) Acute Rehab PT Goals Patient Stated Goal: to get moving PT Goal Formulation: With patient/family Time For Goal Achievement: 11/04/13 Potential to Achieve Goals: Good Progress towards PT goals: Progressing toward goals    Frequency  Min 3X/week    PT Plan Current plan remains appropriate    Co-evaluation             End of Session Equipment Utilized During Treatment: Gait belt Activity Tolerance: Other (comment) (limited secondary to nausea and lightheadedness) Patient  left: in chair;with call bell/phone within reach     Time: 1412-1436 PT Time Calculation (min): 24 min  Charges:  $Gait Training: 8-22 mins $Therapeutic Activity: 8-22 mins                    G Codes:      Quin Hoop 11/01/2013, 5:01 PM  Roney Marion, [place] Pager [phone] Office [phone]

## 2013-11-01 NOTE — Plan of Care (Signed)
Problem: Phase I Progression Outcomes Goal: Incision/dressings dry and intact Outcome: Completed/Met Date Met:  11/01/13     

## 2013-11-01 NOTE — Progress Notes (Signed)
Subjective: 5 Days Post-Op Procedure(s) (LRB):   REVISION OF  EXTERNAL FIXATOR RIGHT LOWER LEG   ORIF  RIGHT TIBIAL PILON APPLICATION OF WOUND VAC (Right) Patient reports pain as 3 on 0-10 scale.  Patient with moderate nausea today.  No vomiting.  Increased lightheadedness when transferring from bed to chair.  Positive flatus and bm.  Tolerating diet.    Objective: Vital signs in last 24 hours: Temp:  [98 F (36.7 C)-99 F (37.2 C)] 98 F (36.7 C) (11/01 0627) Pulse Rate:  [72-80] 72 (11/01 0627) Resp:  [16-18] 16 (11/01 0627) BP: (123-129)/(64-69) 123/69 mmHg (11/01 0627) SpO2:  [96 %-99 %] 98 % (11/01 0627)  Intake/Output from previous day: 10/31 0701 - 11/01 0700 In: 880 [P.O.:880] Out: 1425 [Urine:1425] Intake/Output this shift: Total I/O In: 320 [P.O.:320] Out: -    Recent Labs  10/30/13 0605  HGB 11.1*    Recent Labs  10/30/13 0605  WBC 8.5  RBC 3.41*  HCT 33.2*  PLT 215    Recent Labs  10/30/13 0605  NA 139  K 4.4  CL 101  CO2 26  BUN 12  CREATININE 0.75  GLUCOSE 118*  CALCIUM 8.4   No results for input(s): LABPT, INR in the last 72 hours.  Neurologically intact Neurovascular intact  Sensation intact distally Negative homans bilaterally Dressing c/d/i Wound stable-no signs of infection Swelling stable since yesterday   Assessment/Plan: 5 Days Post-Op Procedure(s) (LRB):   REVISION OF  EXTERNAL FIXATOR RIGHT LOWER LEG   ORIF  RIGHT TIBIAL PILON APPLICATION OF WOUND VAC (Right) Advance diet Up with therapy Discharge to SNF most likely tomorrow NWB RLE-toe and knee motion as tolerated Continue with foley ABLA-stable Continue with daily pin care-wash with soap and water  Kevin Kim, M. LINDSEY 11/01/2013, 10:32 AM

## 2013-11-02 DIAGNOSIS — Z7982 Long term (current) use of aspirin: Secondary | ICD-10-CM | POA: Diagnosis not present

## 2013-11-02 DIAGNOSIS — R351 Nocturia: Secondary | ICD-10-CM | POA: Diagnosis not present

## 2013-11-02 DIAGNOSIS — F329 Major depressive disorder, single episode, unspecified: Secondary | ICD-10-CM | POA: Diagnosis present

## 2013-11-02 DIAGNOSIS — R339 Retention of urine, unspecified: Secondary | ICD-10-CM | POA: Diagnosis not present

## 2013-11-02 DIAGNOSIS — R338 Other retention of urine: Secondary | ICD-10-CM | POA: Diagnosis not present

## 2013-11-02 DIAGNOSIS — M1990 Unspecified osteoarthritis, unspecified site: Secondary | ICD-10-CM | POA: Diagnosis present

## 2013-11-02 DIAGNOSIS — D62 Acute posthemorrhagic anemia: Secondary | ICD-10-CM | POA: Diagnosis not present

## 2013-11-02 DIAGNOSIS — S82301A Unspecified fracture of lower end of right tibia, initial encounter for closed fracture: Secondary | ICD-10-CM | POA: Diagnosis not present

## 2013-11-02 DIAGNOSIS — S82831B Other fracture of upper and lower end of right fibula, initial encounter for open fracture type I or II: Secondary | ICD-10-CM | POA: Diagnosis present

## 2013-11-02 DIAGNOSIS — R278 Other lack of coordination: Secondary | ICD-10-CM | POA: Diagnosis not present

## 2013-11-02 DIAGNOSIS — S82202S Unspecified fracture of shaft of left tibia, sequela: Secondary | ICD-10-CM | POA: Diagnosis not present

## 2013-11-02 DIAGNOSIS — S82402S Unspecified fracture of shaft of left fibula, sequela: Secondary | ICD-10-CM | POA: Diagnosis not present

## 2013-11-02 DIAGNOSIS — Z91010 Allergy to peanuts: Secondary | ICD-10-CM | POA: Diagnosis not present

## 2013-11-02 DIAGNOSIS — Z823 Family history of stroke: Secondary | ICD-10-CM | POA: Diagnosis not present

## 2013-11-02 DIAGNOSIS — M792 Neuralgia and neuritis, unspecified: Secondary | ICD-10-CM | POA: Diagnosis not present

## 2013-11-02 DIAGNOSIS — Z7901 Long term (current) use of anticoagulants: Secondary | ICD-10-CM | POA: Diagnosis not present

## 2013-11-02 DIAGNOSIS — D649 Anemia, unspecified: Secondary | ICD-10-CM | POA: Diagnosis not present

## 2013-11-02 DIAGNOSIS — Z8249 Family history of ischemic heart disease and other diseases of the circulatory system: Secondary | ICD-10-CM | POA: Diagnosis not present

## 2013-11-02 DIAGNOSIS — S82301B Unspecified fracture of lower end of right tibia, initial encounter for open fracture type I or II: Secondary | ICD-10-CM | POA: Diagnosis not present

## 2013-11-02 DIAGNOSIS — R2681 Unsteadiness on feet: Secondary | ICD-10-CM | POA: Diagnosis not present

## 2013-11-02 DIAGNOSIS — S81801A Unspecified open wound, right lower leg, initial encounter: Secondary | ICD-10-CM | POA: Diagnosis not present

## 2013-11-02 DIAGNOSIS — S82831E Other fracture of upper and lower end of right fibula, subsequent encounter for open fracture type I or II with routine healing: Secondary | ICD-10-CM | POA: Diagnosis not present

## 2013-11-02 DIAGNOSIS — S89301A Unspecified physeal fracture of lower end of right fibula, initial encounter for closed fracture: Secondary | ICD-10-CM | POA: Diagnosis not present

## 2013-11-02 DIAGNOSIS — Z881 Allergy status to other antibiotic agents status: Secondary | ICD-10-CM | POA: Diagnosis not present

## 2013-11-02 DIAGNOSIS — Z9181 History of falling: Secondary | ICD-10-CM | POA: Diagnosis not present

## 2013-11-02 DIAGNOSIS — M6281 Muscle weakness (generalized): Secondary | ICD-10-CM | POA: Diagnosis not present

## 2013-11-02 DIAGNOSIS — F419 Anxiety disorder, unspecified: Secondary | ICD-10-CM | POA: Diagnosis present

## 2013-11-02 DIAGNOSIS — K219 Gastro-esophageal reflux disease without esophagitis: Secondary | ICD-10-CM | POA: Diagnosis not present

## 2013-11-02 DIAGNOSIS — S82872F Displaced pilon fracture of left tibia, subsequent encounter for open fracture type IIIA, IIIB, or IIIC with routine healing: Secondary | ICD-10-CM | POA: Diagnosis not present

## 2013-11-02 DIAGNOSIS — R350 Frequency of micturition: Secondary | ICD-10-CM | POA: Diagnosis not present

## 2013-11-02 DIAGNOSIS — K5900 Constipation, unspecified: Secondary | ICD-10-CM | POA: Diagnosis not present

## 2013-11-02 MED ORDER — CIPROFLOXACIN HCL 500 MG PO TABS
500.0000 mg | ORAL_TABLET | Freq: Two times a day (BID) | ORAL | Status: DC
  Filled 2013-11-02 (×3): qty 1

## 2013-11-02 MED ORDER — CIPROFLOXACIN HCL 500 MG PO TABS: 500.0000 mg | ORAL_TABLET | Freq: Two times a day (BID) | ORAL | Status: DC

## 2013-11-02 NOTE — Progress Notes (Signed)
Orthopaedic Trauma Service Progress Note  Subjective  Patient is doing well Worked with therapy over the weekend and did better. Did get somewhat lightheaded, patient has had this issue chronically his entire life. No other complaints Ready to go to nursing home today Pain is stable and current medications are helping  Foley remains in place   Review of Systems  Constitutional: Negative for fever and chills.  Respiratory: Negative for shortness of breath and wheezing.   Cardiovascular: Negative for chest pain and palpitations.  Gastrointestinal: Negative for heartburn, nausea, vomiting and abdominal pain.  Genitourinary:       Foley  Neurological: Negative for tingling and sensory change.     Objective   BP 133/66 mmHg  Pulse 72  Temp(Src) 99.2 F (37.3 C) (Oral)  Resp 18  Ht 5\' 8"  (1.727 m)  Wt 132.45 kg (292 lb)  BMI 44.41 kg/m2  SpO2 98%  Intake/Output      11/01 0701 - 11/02 0700 11/02 0701 - 11/03 0700   P.O. 640    Total Intake(mL/kg) 640 (4.8)    Urine (mL/kg/hr) 2600 (0.8)    Total Output 2600     Net -1960            Stool Occurrence 1 x      Labs No new labs  Exam  Gen: resting comfortably in bed, NAD Lungs: clear B Cardiac: RRR, s1 and s2 Abd: + BS, NTND, soft Ext:        Right Lower Extremity               Ex fix stable             pinsites look great            dressing change this morning             Wound is stable, edges are still approximated             No active drainage             Proximal edges are dark but wound appears stable  Overall color does appear to be better to the soft tissue of the traumatic wound             No signs of infection                Ext warm             + DP pulse             Swelling controlled             Distal motor and sensory functions intact          Assessment and Plan   POD/HD#: 66   68 y/o male s/p fall off ladder with open severely comminuted R pilon fracture and comminuted R  fibula fracture  1. Fall off ladder  2. Open comminuted R pilon and fibula fxs s/p repeat I&D, ex fix revision and limited percutaneous fixation                          NWB x 8 weeks             Toe and knee motion as tolerated             PT/OT evals             Ice and elevate  Wound dressed with adaptic, 4x4's, kerlix and ace. Dressing changes every other day   Adaptic, 4x4's, kerlix and ace                        Discussed with pt the possibility of referral to [facility] plastics given distal nature of wound   3. Pain management             Continue with current regimen  4. ABL anemia/Hemodynamics             Stable  5. Acute Urinary retention             continue with foley              Continue with flomax as well               U/A negative               [facility] with [facility] on 11/04/2013  Cipro  For prophylaxis  6. DVT/PE prophylaxis:             lovenox x 3 weeks  7. ID:               Completed abx for open fx treatment  cipro for retained foley   8. Activity:             OOB as tolerated             PT/OT   9. FEN/Foley/Lines:             Continue with foley- DO NOT REMOVE                10.Ex-fix/Splint care:             Ok to lift extremity by fixator             daily [employer]- wash with soap and water   11. Impediments to fracture healing:             Open fracture                12. Dispo:             continue with PT/OT             SNF when bed available                Follow up with urology in 3 days    Follow up with [facility] in 1 week  Make follow up with [facility] for later this week or early next week     Jari Pigg, PA-C [facility] [phone] ([phone] (O) 11/02/2013 8:43 AM

## 2013-11-02 NOTE — Progress Notes (Signed)
CARE MANAGEMENT NOTE 11/02/2013  Patient:  Kevin Kim, Kevin Kim   Account Number:  [id number]  Date Initiated:  10/28/2013  Documentation initiated by:  Ricki Miller  Subjective/Objective Assessment:   68 yr old male admitted s/p fall with a right distal tibia fracture. Patient underwent right Tibia ORIF with external fixator and wound vac placement.     Action/Plan:   Case manager spoke with patient's daughter concerning home health needs at discharge. Referral called to Christa See, [place] Liaison.   Anticipated DC Date:  10/30/2013   Anticipated DC Plan:  SKILLED NURSING FACILITY  In-house referral  Clinical Social Worker      DC Planning Services  CM consult      Choice offered to / List presented to:             Status of service:  Completed, signed off Medicare Important Message given?  YES (If response is "NO", the following Medicare IM given date fields will be blank) Date Medicare IM given:  10/28/2013 Medicare IM given by:  Ricki Miller Date Additional Medicare IM given:  11/02/2013 Additional Medicare IM given by:  [place] Plan  Discharge Disposition:  [place]  Per UR Regulation:  Reviewed for med. necessity/level of care/duration of stay  If discussed at [place] of Stay Meetings, dates discussed:   10/29/2013    Comments:  10/29/13 11:00am Ricki Miller, RN BSN Case Manager Patient will require shortterm SNF for rehab. He is NWB and lives alone. Daughter and son supportive but can not provide 24/7 care.

## 2013-11-02 NOTE — Progress Notes (Addendum)
Patient D/C in stable condition via stretcher with copy of chart.  Attempt to call report - no answer.

## 2013-11-02 NOTE — Clinical Social Work Note (Signed)
Pt to be discharged to [facility]. Pt's daughter, Cristopher Ciccarelli, updated regarding discharge.  [place] [facility]: [phone] Transportation: EMS ([address])  Lubertha Sayres, [place] ([phone]) Licensed Clinical Social Worker Orthopedics [phone]) and Surgical [phone])

## 2013-11-03 ENCOUNTER — Encounter: Payer: Self-pay | Admitting: Adult Health

## 2013-11-03 ENCOUNTER — Non-Acute Institutional Stay (SKILLED_NURSING_FACILITY): Payer: Medicare Other | Admitting: Adult Health

## 2013-11-03 DIAGNOSIS — S82872F Displaced pilon fracture of left tibia, subsequent encounter for open fracture type IIIA, IIIB, or IIIC with routine healing: Secondary | ICD-10-CM | POA: Diagnosis not present

## 2013-11-03 DIAGNOSIS — R339 Retention of urine, unspecified: Secondary | ICD-10-CM | POA: Diagnosis not present

## 2013-11-03 DIAGNOSIS — D62 Acute posthemorrhagic anemia: Secondary | ICD-10-CM

## 2013-11-03 DIAGNOSIS — S82832F Other fracture of upper and lower end of left fibula, subsequent encounter for open fracture type IIIA, IIIB, or IIIC with routine healing: Secondary | ICD-10-CM

## 2013-11-03 DIAGNOSIS — K219 Gastro-esophageal reflux disease without esophagitis: Secondary | ICD-10-CM

## 2013-11-03 NOTE — Anesthesia Postprocedure Evaluation (Signed)
  Anesthesia Post-op Note  Patient: Kevin Kim  Procedure(s) Performed: Procedure(s):   REVISION OF  EXTERNAL FIXATOR RIGHT LOWER LEG   ORIF  RIGHT TIBIAL PILON APPLICATION OF WOUND VAC (Right)  Patient Location: PACU  Anesthesia Type:General  Level of Consciousness: awake and sedated  Airway and Oxygen Therapy: Patient Spontanous Breathing  Post-op Pain: mild  Post-op Assessment: Post-op Vital signs reviewed  Post-op Vital Signs: stable  Last Vitals:  Filed Vitals:   11/02/13 0532  BP: 133/66  Pulse: 72  Temp: 37.3 C  Resp: 18    Complications: No apparent anesthesia complications

## 2013-11-03 NOTE — Progress Notes (Signed)
Patient ID: Kevin Kim, male   DOB: 10/05/1945, 68 y.o.   MRN: [id number]   11/03/2013  Facility:  Nursing Home Location:  [place] Room Number: 1204-P LEVEL OF CARE:  SNF (31)   Chief Complaint  Patient presents with  . Hospitalization Follow-up    Open fracture of tibial plafond with fibula involvement, GERD, Urinary retention, anemia    HISTORY OF PRESENT ILLNESS:  This is a 68 year old male who has been admitted to [facility] on 11/02/13 from [facility]. He fell from a ladder and sustained an open fracture of tibial plafond with fibula involvement. He had Irrigation and debridement and External fixation of right ankle. He has been admitted for a short-term rehabilitation  REASSESSMENT OF ONGOING PROBLEMS:  GERD: pt's GERD is stable.  Denies ongoing heartburn, abd. Pain, nausea or vomiting.  Currently on a PPI & tolerates it without any adverse reactions.  URINARY INCONTINENCE: He has a foley catheter in place draining yellowish urine. Currently on Cipro for prophylaxis and will follow-up with urologist.  ANEMIA: The anemia has been stable. The patient denies fatigue, melena or hematochezia. No complications from the medications currently being used.   PAST MEDICAL HISTORY:  Past Medical History  Diagnosis Date  . GERD (gastroesophageal reflux disease)   . Irregular heartbeat     CURRENT MEDICATIONS: Reviewed per MAR/see medication list  Allergies  Allergen Reactions  . Amoxicillin Other (See Comments)    Gi upset; abdominal pain  . Peanut-Containing Drug Products Hives    All nuts     REVIEW OF SYSTEMS:  GENERAL: no change in appetite, no fatigue, no weight changes, no fever, chills or weakness RESPIRATORY: no cough, SOB, DOE, wheezing, hemoptysis CARDIAC: no chest pain, or palpitations, +edema GI: no abdominal pain, diarrhea, constipation, heart burn, nausea or vomiting  PHYSICAL EXAMINATION  GENERAL: no acute  distress, normal body habitus EYES: conjunctivae normal, sclerae normal, normal eye lids NECK: supple, trachea midline, no neck masses, no thyroid tenderness, no thyromegaly LYMPHATICS: no LAN in the neck, no supraclavicular LAN RESPIRATORY: breathing is even & unlabored, BS CTAB CARDIAC: RRR, no murmur,no extra heart sounds, RLR edema 2+ GI: abdomen soft, normal BS, no masses, no tenderness, no hepatomegaly, no splenomegaly GU: + foley catheter EXTREMITIES:  RLE has external fixators and wound covered with dry dressing PSYCHIATRIC: the patient is alert & oriented to person, affect & behavior appropriate  LABS/RADIOLOGY: Labs reviewed: Basic Metabolic Panel:  Recent Labs  10/28/13 0603 10/29/13 0520 10/30/13 0605  NA 139 137 139  K 4.2 4.1 4.4  CL 101 100 101  CO2 28 26 26   GLUCOSE 124* 122* 118*  BUN 10 11 12   CREATININE 0.76 0.72 0.75  CALCIUM 8.2* 8.5 8.4   Liver Function Tests:  Recent Labs  10/27/13 0632  AST 26  ALT 15  ALKPHOS 50  BILITOT 1.3*  PROT 6.2  ALBUMIN 3.2*   CBC:  Recent Labs  10/24/13 1640  10/27/13 1643 10/28/13 0603 10/30/13 0605  WBC 9.1  < > 8.5 7.7 8.5  NEUTROABS 6.7  --   --   --   --   HGB 14.6  < > 10.6* 10.2* 11.1*  HCT 41.6  < > 31.1* 30.3* 33.2*  MCV 92.2  < > 95.1 97.7 97.4  PLT 207  < > 162 152 215  < > = values in this interval not displayed.  Dg Tibia/fibula Right  10/24/2013   CLINICAL  DATA:  Intraoperative  EXAM: DG C-ARM 61-120 MIN; RIGHT TIBIA AND FIBULA - 2 VIEW  TECHNIQUE: Intraoperative fluoroscopy is utilized for surgical control purposes.  FLUOROSCOPY TIME:  Fluoroscopy time is reported at 42 seconds.  COMPARISON:  Right ankle 10/24/2013  FINDINGS: Intraoperative spot fluoroscopic views of the right ankle are obtained a demonstrating comminuted fractures of the distal right tibial and fibular shafts and metaphysis seal regions. Fracture lines extend to the tibial articular surface posteriorly with mild step-off at  the tibiotalar joint. Soft tissue gas is demonstrated.  IMPRESSION: Intraoperative fluoroscopy obtained for surgical control purposes.   Electronically Signed   By: Lucienne Capers M.D.   On: 10/24/2013 22:24   Dg Ankle Complete Right  10/27/2013   CLINICAL DATA:  Adjustment of external fixation device for fixation of a right distal tibia/ankle fracture.  EXAM: DG C-ARM GT 120 MIN; RIGHT ANKLE - COMPLETE 3+ VIEW  COMPARISON:  CT, 10/26/2013.  FINDINGS: Portable images show placement of fixation screws across the distal tibia reducing the subchondral bone fragments into near anatomic alignment. Multiple additional comminuted fractures are noted along the distal fibula and distal tibia, without significant angulation or displacement.  No evidence of an operative complication.  IMPRESSION: ORIF of distal tibial fractures.   Electronically Signed   By: Lajean Manes M.D.   On: 10/27/2013 12:55   Ct Ankle Right Wo Contrast  10/25/2013   CLINICAL DATA:  Fall from a ladder. Ankle surgery last night. Fracture of distal right tibia, open type 1 or 2, initial encounter. Distal fibular fracture.  EXAM: CT OF THE RIGHT ANKLE WITHOUT CONTRAST  TECHNIQUE: Multidetector CT imaging of the right ankle was performed according to the standard protocol. Multiplanar CT image reconstructions were also generated.  COMPARISON:  Multiple exams, including 10/24/2013  FINDINGS: Imaging was performed to include the distal tibial and fibular fractures. The imaging extends part way through the rest of the ankle, including most but not all of the calcaneus, some of the cuboid, and some of the cuneiforms. Type 3 Pilon fracture of the distal tibia and fibula noted with a severe degree of comminution distal tibia and fibula causing a shattered bone appearance. There is about 7 mm of proximal impaction/migration of the posterior malleolar dominant fragment along the articular surface as on image 21 of series 8, and numerous intermediary  fragments along the fracture which involves the distal 9.5 cm of the tibia. Various fracture planes and shattered bone are present along the articular surface, with gas tracking along fracture planes, the skin and subcutaneous tissues, in the tibiotalar joint, in the sinus tarsi, and along the distal fibular fracture planes. However, alignment is significantly improved compared to the initial radiographs. There are several fragments of cortical bone within the central fracture complex in the distal tibia measuring up to 9 mm in length.  An external fixator pin passes into the calcaneus ; no significant fracture of the visualized portion of the calcaneus. Fragments lining the tibiotalar joint and talofibular articulation. Linear calcifications in the expected location of on os trigonum may be in the posterior portion of the joint.  Similarly, there is a severely comminuted fracture of the distal fibula with multiple intermediary fragments and expanded appearance of the distal fibula likely related to compressive spreading of fragments as on image 11 of series 8.  Expected degree of surrounding hematoma. On images 37-44 of series 4, some of the distal fibular fracture fragments track around the margins of the peroneus tendons, but  I would not call the tendons "entrapped" at this time.  IMPRESSION: 1. Heavily comminuted open distal type 3 Pilon fracture of the distal tibia and fibula as detailed above. Improved alignment following external fixation. Several small cortical fragments are embedded deep within the central fracture complex in the distal tibial metaphysis. No overt tendon entrapment within the fracture planes observed.   Electronically Signed   By: Sherryl Barters M.D.   On: 10/25/2013 11:38   Ct 3d Recon At Scanner  10/26/2013   CLINICAL DATA:  Nonspecific (abnormal) findings on radiological and other examination of musculoskeletal system. Pilon fracture of the distal RIGHT leg.  EXAM: 3-DIMENSIONAL CT  IMAGE RENDERING ON ACQUISITION WORKSTATION  TECHNIQUE: 3-dimensional CT images were rendered by post-processing of the original CT data on an acquisition workstation. The 3-dimensional CT images were interpreted and findings were reported in the accompanying complete CT report for this study  COMPARISON:  CT 10/25/2013 at 0958 hr.  Radiographs 10/24/2013.  FINDINGS: Three-dimensional reconstructions of the distal tibia and fibula were performed, with the talus digitally subtracted. Comminuted distal tibia and fibula fractures present.  IMPRESSION: Comminuted distal tibia and fibula fractures.   Electronically Signed   By: Dereck Ligas M.D.   On: 10/26/2013 09:09   Dg Knee Right Port  10/24/2013   CLINICAL DATA:  [place] from roof, landing on both feet. Open fracture. Initial encounter.  EXAM: PORTABLE RIGHT KNEE - 1-2 VIEW  COMPARISON:  None.  FINDINGS: Mild degenerative changes in the right knee with spurring and slight joint space narrowing. No acute bony abnormality. Specifically, no fracture, subluxation, or dislocation. Soft tissues are intact. No joint effusion.  IMPRESSION: No acute bony abnormality.   Electronically Signed   By: Rolm Baptise M.D.   On: 10/24/2013 18:33   Dg Tibia/fibula Right Port  10/24/2013   CLINICAL DATA:  Fall from roof on both feet, open fracture  EXAM: PORTABLE RIGHT TIBIA AND FIBULA - 2 VIEW  COMPARISON:  None.  FINDINGS: No evidence of fracture or dislocation of the proximal tibia/fibula.  Comminuted distal tibia/ fibular fractures are incompletely visualized, better evaluated on dedicated ankle radiographs.  IMPRESSION: No evidence of fracture or dislocation of the proximal tibia/fibula.  Refer to dedicated ankle radiographs for description of comminuted distal tibia/ fibular fractures.   Electronically Signed   By: Julian Hy M.D.   On: 10/24/2013 18:28   Dg Ankle Right Port  10/27/2013   CLINICAL DATA:  Status post suggest not of external fixation device and  ORIF of a right pilon fracture.  EXAM: PORTABLE RIGHT ANKLE - 2 VIEW  COMPARISON:  Earlier operative images obtained this same date.  FINDINGS: Images show placement 3 screws across the distal tibia just above the tibial plafond. There are multiple comminuted fractures of the distal tibia and fibula. Fractures are slightly displaced with some angulation, most notably the tibia, mildly angulated laterally. External fixator spans the mid tibia to the foot.  IMPRESSION: Images from the ORIF and adjustment of an external fixator for extensive comminuted fractures of the distal tibia and fibula. No radiographic evidence of an operative complication.   Electronically Signed   By: Lajean Manes M.D.   On: 10/27/2013 13:57   Dg Ankle Right Port  10/24/2013   CLINICAL DATA:  Fall from roof on both feet, open fracture  EXAM: PORTABLE RIGHT ANKLE - 2 VIEW  COMPARISON:  None.  FINDINGS: Comminuted, segmental distal fibular fracture with mild displacement/angulation.  Comminuted oblique, segmental distal  tibial fracture. Tip of proximal fracture fragment approaches the anterior skin surface, likely corresponding to known open fracture.  Exact tibiotalar relationship is difficult to confirm on these mildly obliqued radiographs but appears approximately preserved.  IMPRESSION: Open comminuted distal tibial fracture, as above.  Comminuted distal fibular fracture, as above.   Electronically Signed   By: Julian Hy M.D.   On: 10/24/2013 18:36   Dg C-arm 1-60 Min  10/24/2013   CLINICAL DATA:  Intraoperative  EXAM: DG C-ARM 61-120 MIN; RIGHT TIBIA AND FIBULA - 2 VIEW  TECHNIQUE: Intraoperative fluoroscopy is utilized for surgical control purposes.  FLUOROSCOPY TIME:  Fluoroscopy time is reported at 42 seconds.  COMPARISON:  Right ankle 10/24/2013  FINDINGS: Intraoperative spot fluoroscopic views of the right ankle are obtained a demonstrating comminuted fractures of the distal right tibial and fibular shafts and  metaphysis seal regions. Fracture lines extend to the tibial articular surface posteriorly with mild step-off at the tibiotalar joint. Soft tissue gas is demonstrated.  IMPRESSION: Intraoperative fluoroscopy obtained for surgical control purposes.   Electronically Signed   By: Lucienne Capers M.D.   On: 10/24/2013 22:24   Dg C-arm Gt 120 Min  10/27/2013   CLINICAL DATA:  Adjustment of external fixation device for fixation of a right distal tibia/ankle fracture.  EXAM: DG C-ARM GT 120 MIN; RIGHT ANKLE - COMPLETE 3+ VIEW  COMPARISON:  CT, 10/26/2013.  FINDINGS: Portable images show placement of fixation screws across the distal tibia reducing the subchondral bone fragments into near anatomic alignment. Multiple additional comminuted fractures are noted along the distal fibula and distal tibia, without significant angulation or displacement.  No evidence of an operative complication.  IMPRESSION: ORIF of distal tibial fractures.   Electronically Signed   By: Lajean Manes M.D.   On: 10/27/2013 12:55    ASSESSMENT/PLAN:  Open fracture of the tibial plafond with fibula involvement status post irrigation and debridement and an external fixation of right ankle -  Follow-up with orthopedic surgery and plastic surgery; for rehabilitation GERD - continue Prevacid Anemia, acute blood loss - stable Urinary retention - continue Foley catheter; follow-up with urologist   Spent 50 minutes in patient care.    [facility], NP [employer] [phone]

## 2013-11-05 ENCOUNTER — Non-Acute Institutional Stay (SKILLED_NURSING_FACILITY): Payer: Medicare Other | Admitting: Internal Medicine

## 2013-11-05 ENCOUNTER — Encounter: Payer: Self-pay | Admitting: Internal Medicine

## 2013-11-05 DIAGNOSIS — D62 Acute posthemorrhagic anemia: Secondary | ICD-10-CM

## 2013-11-05 DIAGNOSIS — K5900 Constipation, unspecified: Secondary | ICD-10-CM

## 2013-11-05 DIAGNOSIS — R351 Nocturia: Secondary | ICD-10-CM | POA: Diagnosis not present

## 2013-11-05 DIAGNOSIS — S82832F Other fracture of upper and lower end of left fibula, subsequent encounter for open fracture type IIIA, IIIB, or IIIC with routine healing: Secondary | ICD-10-CM

## 2013-11-05 DIAGNOSIS — K219 Gastro-esophageal reflux disease without esophagitis: Secondary | ICD-10-CM

## 2013-11-05 DIAGNOSIS — M792 Neuralgia and neuritis, unspecified: Secondary | ICD-10-CM

## 2013-11-05 DIAGNOSIS — R350 Frequency of micturition: Secondary | ICD-10-CM | POA: Diagnosis not present

## 2013-11-05 DIAGNOSIS — S82301B Unspecified fracture of lower end of right tibia, initial encounter for open fracture type I or II: Secondary | ICD-10-CM | POA: Diagnosis not present

## 2013-11-05 DIAGNOSIS — R338 Other retention of urine: Secondary | ICD-10-CM | POA: Diagnosis not present

## 2013-11-05 DIAGNOSIS — S82872F Displaced pilon fracture of left tibia, subsequent encounter for open fracture type IIIA, IIIB, or IIIC with routine healing: Secondary | ICD-10-CM

## 2013-11-05 DIAGNOSIS — R339 Retention of urine, unspecified: Secondary | ICD-10-CM | POA: Diagnosis not present

## 2013-11-05 NOTE — Progress Notes (Signed)
Patient ID: Kevin Kim, male   DOB: Sep 04, 1945, 68 y.o.   MRN: [id number]     [facility]   PCP: Mathews Argyle, MD  Code Status: full code  Allergies  Allergen Reactions  . Amoxicillin Other (See Comments)    Gi upset; abdominal pain  . Peanut-Containing Drug Products Hives    All nuts    Chief Complaint: new admission      PCP: Mathews Argyle, MD  Code Status: full code  Allergies  Allergen Reactions  . Amoxicillin Other (See Comments)    Gi upset; abdominal pain  . Peanut-Containing Drug Products Hives    All nuts    Chief Complaint: new admit  HPI:  68 y/o male patient is here for STR after hospital admission with open fracture of his lower leg. he had Irrigation and debridement and external fixation of right ankle. Follow-Up CT Scan showed severely comminuted Distal Tibia fracture. Trauma service was consulted. He was taken again to the OR and underwent limited percutaneous fixation.  Wound VAC was removed on postoperative day #3 and plastic surgery at [facility] was consulted. He required foley for urinary retention and was started on flomax and 5 days of cipro.  He is here for rehabilitation. He is seen in his room today with his daughter present. He had urology appointment this am but the visit was not productive as he could not get on the exam bed for exam and appointment has been rescheduled. He then had orthopedic appointment this afternoon where he was informed that he needed to see the trauma orthopedic team. Patient thus feels frustrated.  He has hx of vasovagal episodes for several years and mentions he felt light headed today with therapy. He feels fine at present. Current pain regimen of percocet is helpful for him. He is non weight bearing and on a wheelchair at present          Review of Systems:  Constitutional: Negative for fever, chills, diaphoresis.  HENT: Negative for congestion Eyes: Negative for  eye pain, blurred vision, double vision and discharge.  Respiratory: Negative for cough, sputum production, shortness of breath and wheezing.   Cardiovascular: Negative for chest pain, palpitations, orthopnea and leg swelling.  Gastrointestinal: Negative for heartburn, nausea, vomiting, abdominal pain. Has constipation.  Genitourinary: has a foley Musculoskeletal: Negative for back pain, falls Skin: Negative for itching and rash.  Neurological: Negative for tingling, focal weakness and headaches.  Psychiatric/Behavioral: Negative for depression and memory loss. The patient is not nervous/anxious.     Past Medical History  Diagnosis Date  . GERD (gastroesophageal reflux disease)   . Irregular heartbeat    Past Surgical History  Procedure Laterality Date  . Hernia repair    . I&d extremity Right 10/24/2013    Procedure: IRRIGATION AND DEBRIDEMENT OPEN RIGHT TIBIA/FIBULA FRACTURE;  Surgeon: Mauri Pole, MD;  Location: [place];  Service: Orthopedics;  Laterality: Right;  . External fixation leg Right 10/24/2013    Procedure: EXTERNAL FIXATION RIGHT LOWER LEG;  Surgeon: Mauri Pole, MD;  Location: [place];  Service: Orthopedics;  Laterality: Right;  . Closed reduction tibia Right 10/24/2013    Procedure: CLOSED REDUCTION TIBIA/FIBULA FRACTURE;  Surgeon: Mauri Pole, MD;  Location: [place];  Service: Orthopedics;  Laterality: Right;  . External fixation leg Right 10/27/2013    Procedure:   REVISION OF  EXTERNAL FIXATOR RIGHT LOWER LEG   ORIF  RIGHT TIBIAL PILON APPLICATION OF WOUND VAC;  Surgeon: Rozanna Box, MD;  Location: [place];  Service: Orthopedics;  Laterality: Right;   Social History:   reports that he has quit smoking. He has never used smokeless tobacco. He reports that he drinks alcohol. He reports that he does not use illicit drugs.  Family History  Problem Relation Age of Onset  . Cancer Mother   . Cancer Father     Medications: Patient's Medications  New  Prescriptions   No medications on file  Previous Medications   ASPIRIN 81 MG CHEWABLE TABLET    Chew 81 mg by mouth every morning.   CIPROFLOXACIN (CIPRO) 500 MG TABLET    Take 1 tablet (500 mg total) by mouth 2 (two) times daily.   DOCUSATE SODIUM 100 MG CAPS    Take 100 mg by mouth 2 (two) times daily.   ENOXAPARIN (LOVENOX) 40 MG/0.4ML INJECTION    Inject 0.4 mLs (40 mg total) into the skin daily.   HYDROXYZINE (ATARAX/VISTARIL) 50 MG TABLET    Take 1 tablet (50 mg total) by mouth 3 (three) times daily as needed for itching, anxiety or nausea.   LANSOPRAZOLE (PREVACID) 30 MG CAPSULE    Take 30 mg by mouth every morning.    METHOCARBAMOL (ROBAXIN) 500 MG TABLET    Take 1-2 tablets (500-1,000 mg total) by mouth every 6 (six) hours as needed for muscle spasms.   MULTIPLE VITAMINS-MINERALS (MULTIVITAMINS THER. W/MINERALS) TABS    Take 1 tablet by mouth every morning. Centrum Silver   OXYCODONE (OXY IR/ROXICODONE) 5 MG IMMEDIATE RELEASE TABLET    Take 1-2 tablets (5-10 mg total) by mouth every 6 (six) hours as needed for breakthrough pain (take between percocet for breakthrough pain).   OXYCODONE-ACETAMINOPHEN (PERCOCET/ROXICET) 5-325 MG PER TABLET    Take 1-2 tablets by mouth every 6 (six) hours as needed for moderate pain or severe pain.   PREGABALIN (LYRICA) 75 MG CAPSULE    Take 1 capsule (75 mg total) by mouth 2 (two) times daily.   TAMSULOSIN (FLOMAX) 0.4 MG CAPS CAPSULE    Take 1 capsule (0.4 mg total) by mouth daily after supper.   VITAMIN D, ERGOCALCIFEROL, (DRISDOL) 50000 UNITS CAPS CAPSULE    Take 50,000 Units by mouth once a week. On Fridays  Modified Medications   No medications on file  Discontinued Medications   No medications on file     Physical Exam: Filed Vitals:   11/05/13 1250  BP: 124/67  Pulse: 76  Temp: 98.6 F (37 C)  Resp: 18  Weight: 192 lb 12.8 oz (87.454 kg)  SpO2: 98%    General- elderly male in no acute distress Head- atraumatic, normocephalic Eyes-  no pallor, no icterus, no discharge Neck- no lymphadenopathy Throat- moist mucus membrane, normal oropharynx Nose- normal nasal mucosa Cardiovascular- normal s1,s2, no murmurs Respiratory- bilateral clear to auscultation, no wheeze, no rhonchi, no crackles, no use of accessory muscles Abdomen- bowel sounds present, soft, non tender Musculoskeletal- able to move all 4 extremities, right leg has fixator in place, dressing in lower leg clean and dry Neurological- no focal deficit Skin- warm and dry, dressing site clean, foley in place, of note pt was having bleeding at fixator site yesterday but none noted today Psychiatry- alert and oriented to person, place and time, normal mood and affect    Labs reviewed: Basic Metabolic Panel:  Recent Labs  10/28/13 0603 10/29/13 0520 10/30/13 0605  NA 139 137 139  K 4.2 4.1 4.4  CL 101 100  101  CO2 28 26 26   GLUCOSE 124* 122* 118*  BUN 10 11 12   CREATININE 0.76 0.72 0.75  CALCIUM 8.2* 8.5 8.4   Liver Function Tests:  Recent Labs  10/27/13 0632  AST 26  ALT 15  ALKPHOS 50  BILITOT 1.3*  PROT 6.2  ALBUMIN 3.2*   No results for input(s): LIPASE, AMYLASE in the last 8760 hours. No results for input(s): AMMONIA in the last 8760 hours. CBC:  Recent Labs  10/24/13 1640  10/27/13 1643 10/28/13 0603 10/30/13 0605  WBC 9.1  < > 8.5 7.7 8.5  NEUTROABS 6.7  --   --   --   --   HGB 14.6  < > 10.6* 10.2* 11.1*  HCT 41.6  < > 31.1* 30.3* 33.2*  MCV 92.2  < > 95.1 97.7 97.4  PLT 207  < > 162 152 215  < > = values in this interval not displayed.  Procedures Performed: 10/24/2013- Dr. Alvan Dame 1. Both excisional and nonexcisional debridement of the right leg     wound, excisional debridement including sharp excision of skin and     subcutaneous tissue, bone fragments.  This was followed by non-     excisional debridement with 2 L normal saline solution irrigation. 2. Closed reduction and application of the Zimmer external fixator,      spanning external fixator. 3. Examination under fluoro  10/27/2013- Dr. Marcelino Scot 1. Open reduction and internal fixation of right tibial pilon, tibia     only. 2. Revision external fixation of right lower leg. 3. Irrigation and debridement of open fracture including bone. 4. Application of wound VAC small right leg   Assessment/Plan  Tibial fracture S/p ORIF, exteranl fixation of right lower leg, irrigation and debridement and wound vac placement. Now off wound vac. Has fixator in place. Pain controlled with current regimen of percocet. D/c oxyIR. Continue prn robaxin, advised to given robaxin one dose prior to bed time. Continue lovenox for dvt prophylaxis. Also on baby aspirin. Continue vit d supplement. Pt to see trauma ortho next week. Continue skin care. To work with PT and OT as directed  Anemia Likely post op. Monitor cbc  Urinary retention S/p foley. Complete course of cipro. Continue flomax. Continue foley care. Can try voiding trial in a week in facility to assess further  Constipation Continue colace 100 bid and add miralax 17 g daily  gerd Continue prevacid for now  Neuropathic pain Stable on current regimen of lyrica bid, monitor    Family/ staff Communication: reviewed care plan with patient and nursing supervisor  Goals of care: short term rehabilitation    Labs/tests ordered: cbc, cmp    Blanchie Serve, MD  [place] [phone] (Monday-Friday 8 am - 5 pm) [phone] (afterhours)

## 2013-11-13 DIAGNOSIS — S82402S Unspecified fracture of shaft of left fibula, sequela: Secondary | ICD-10-CM | POA: Diagnosis not present

## 2013-11-13 DIAGNOSIS — S82202S Unspecified fracture of shaft of left tibia, sequela: Secondary | ICD-10-CM | POA: Diagnosis not present

## 2013-11-13 DIAGNOSIS — S81801A Unspecified open wound, right lower leg, initial encounter: Secondary | ICD-10-CM | POA: Diagnosis not present

## 2013-11-15 DIAGNOSIS — S82831B Other fracture of upper and lower end of right fibula, initial encounter for open fracture type I or II: Secondary | ICD-10-CM | POA: Diagnosis present

## 2013-11-15 DIAGNOSIS — Z823 Family history of stroke: Secondary | ICD-10-CM | POA: Diagnosis not present

## 2013-11-15 DIAGNOSIS — Z91010 Allergy to peanuts: Secondary | ICD-10-CM | POA: Diagnosis not present

## 2013-11-15 DIAGNOSIS — F329 Major depressive disorder, single episode, unspecified: Secondary | ICD-10-CM | POA: Diagnosis present

## 2013-11-15 DIAGNOSIS — M1990 Unspecified osteoarthritis, unspecified site: Secondary | ICD-10-CM | POA: Diagnosis present

## 2013-11-15 DIAGNOSIS — S82301B Unspecified fracture of lower end of right tibia, initial encounter for open fracture type I or II: Secondary | ICD-10-CM | POA: Diagnosis present

## 2013-11-15 DIAGNOSIS — Z7982 Long term (current) use of aspirin: Secondary | ICD-10-CM | POA: Diagnosis not present

## 2013-11-15 DIAGNOSIS — Z7901 Long term (current) use of anticoagulants: Secondary | ICD-10-CM | POA: Diagnosis not present

## 2013-11-15 DIAGNOSIS — S81801A Unspecified open wound, right lower leg, initial encounter: Secondary | ICD-10-CM | POA: Diagnosis not present

## 2013-11-15 DIAGNOSIS — S89301A Unspecified physeal fracture of lower end of right fibula, initial encounter for closed fracture: Secondary | ICD-10-CM | POA: Diagnosis not present

## 2013-11-15 DIAGNOSIS — Z8249 Family history of ischemic heart disease and other diseases of the circulatory system: Secondary | ICD-10-CM | POA: Diagnosis not present

## 2013-11-15 DIAGNOSIS — S82202S Unspecified fracture of shaft of left tibia, sequela: Secondary | ICD-10-CM | POA: Diagnosis not present

## 2013-11-15 DIAGNOSIS — K219 Gastro-esophageal reflux disease without esophagitis: Secondary | ICD-10-CM | POA: Diagnosis present

## 2013-11-15 DIAGNOSIS — Z881 Allergy status to other antibiotic agents status: Secondary | ICD-10-CM | POA: Diagnosis not present

## 2013-11-15 DIAGNOSIS — S82301A Unspecified fracture of lower end of right tibia, initial encounter for closed fracture: Secondary | ICD-10-CM | POA: Diagnosis not present

## 2013-11-15 DIAGNOSIS — F419 Anxiety disorder, unspecified: Secondary | ICD-10-CM | POA: Diagnosis present

## 2013-11-23 ENCOUNTER — Other Ambulatory Visit (HOSPITAL_COMMUNITY): Payer: Medicare Other

## 2013-11-23 ENCOUNTER — Inpatient Hospital Stay
Admission: AD | Admit: 2013-11-23 | Discharge: 2013-12-14 | Disposition: A | Discharge status: Home Health | Payer: Medicare Other | Source: Ambulatory Visit | Attending: Internal Medicine | Admitting: Internal Medicine

## 2013-11-23 DIAGNOSIS — J80 Acute respiratory distress syndrome: Secondary | ICD-10-CM | POA: Diagnosis not present

## 2013-11-23 DIAGNOSIS — Z9889 Other specified postprocedural states: Secondary | ICD-10-CM | POA: Diagnosis not present

## 2013-11-23 DIAGNOSIS — R0603 Acute respiratory distress: Secondary | ICD-10-CM

## 2013-11-23 DIAGNOSIS — N400 Enlarged prostate without lower urinary tract symptoms: Secondary | ICD-10-CM | POA: Diagnosis present

## 2013-11-23 DIAGNOSIS — K219 Gastro-esophageal reflux disease without esophagitis: Secondary | ICD-10-CM | POA: Diagnosis present

## 2013-11-23 DIAGNOSIS — S82202E Unspecified fracture of shaft of left tibia, subsequent encounter for open fracture type I or II with routine healing: Secondary | ICD-10-CM | POA: Diagnosis not present

## 2013-11-23 DIAGNOSIS — Z6830 Body mass index (BMI) 30.0-30.9, adult: Secondary | ICD-10-CM | POA: Diagnosis not present

## 2013-11-23 DIAGNOSIS — R531 Weakness: Secondary | ICD-10-CM | POA: Diagnosis not present

## 2013-11-23 DIAGNOSIS — S82402E Unspecified fracture of shaft of left fibula, subsequent encounter for open fracture type I or II with routine healing: Secondary | ICD-10-CM | POA: Diagnosis not present

## 2013-11-23 DIAGNOSIS — R278 Other lack of coordination: Secondary | ICD-10-CM | POA: Diagnosis not present

## 2013-11-23 DIAGNOSIS — M1990 Unspecified osteoarthritis, unspecified site: Secondary | ICD-10-CM | POA: Diagnosis present

## 2013-11-23 DIAGNOSIS — K5900 Constipation, unspecified: Secondary | ICD-10-CM | POA: Diagnosis not present

## 2013-11-23 DIAGNOSIS — Z48817 Encounter for surgical aftercare following surgery on the skin and subcutaneous tissue: Secondary | ICD-10-CM | POA: Diagnosis present

## 2013-11-23 DIAGNOSIS — S51801D Unspecified open wound of right forearm, subsequent encounter: Secondary | ICD-10-CM | POA: Diagnosis not present

## 2013-11-23 DIAGNOSIS — S82201B Unspecified fracture of shaft of right tibia, initial encounter for open fracture type I or II: Secondary | ICD-10-CM | POA: Diagnosis not present

## 2013-11-23 DIAGNOSIS — S81801A Unspecified open wound, right lower leg, initial encounter: Secondary | ICD-10-CM | POA: Diagnosis not present

## 2013-11-23 DIAGNOSIS — E440 Moderate protein-calorie malnutrition: Secondary | ICD-10-CM | POA: Diagnosis present

## 2013-11-23 DIAGNOSIS — F418 Other specified anxiety disorders: Secondary | ICD-10-CM | POA: Diagnosis present

## 2013-11-24 LAB — CBC WITH DIFFERENTIAL/PLATELET
Basophils Absolute: 0 10*3/uL (ref 0.0–0.1)
Basophils Relative: 1 % (ref 0–1)
EOS ABS: 0.2 10*3/uL (ref 0.0–0.7)
EOS PCT: 4 % (ref 0–5)
HCT: 36.8 % — ABNORMAL LOW (ref 39.0–52.0)
HEMOGLOBIN: 11.9 g/dL — AB (ref 13.0–17.0)
Lymphocytes Relative: 27 % (ref 12–46)
Lymphs Abs: 1.3 10*3/uL (ref 0.7–4.0)
MCH: 30.2 pg (ref 26.0–34.0)
MCHC: 32.3 g/dL (ref 30.0–36.0)
MCV: 93.4 fL (ref 78.0–100.0)
MONOS PCT: 11 % (ref 3–12)
Monocytes Absolute: 0.5 10*3/uL (ref 0.1–1.0)
Neutro Abs: 2.7 10*3/uL (ref 1.7–7.7)
Neutrophils Relative %: 57 % (ref 43–77)
Platelets: 194 10*3/uL (ref 150–400)
RBC: 3.94 MIL/uL — ABNORMAL LOW (ref 4.22–5.81)
RDW: 12.4 % (ref 11.5–15.5)
WBC: 4.7 10*3/uL (ref 4.0–10.5)

## 2013-11-24 LAB — TSH: TSH: 3.46 u[IU]/mL (ref 0.350–4.500)

## 2013-11-24 LAB — COMPREHENSIVE METABOLIC PANEL
ALBUMIN: 3.2 g/dL — AB (ref 3.5–5.2)
ALT: 15 U/L (ref 0–53)
AST: 17 U/L (ref 0–37)
Alkaline Phosphatase: 67 U/L (ref 39–117)
Anion gap: 12 (ref 5–15)
BUN: 16 mg/dL (ref 6–23)
CO2: 28 mEq/L (ref 19–32)
CREATININE: 0.83 mg/dL (ref 0.50–1.35)
Calcium: 9.6 mg/dL (ref 8.4–10.5)
Chloride: 100 mEq/L (ref 96–112)
GFR calc Af Amer: >90 mL/min (ref >90)
GFR calc non Af Amer: 89 mL/min — ABNORMAL LOW (ref >90)
Glucose, Bld: 126 mg/dL — ABNORMAL HIGH (ref 70–99)
Potassium: 4.5 mEq/L (ref 3.7–5.3)
Sodium: 140 mEq/L (ref 137–147)
TOTAL PROTEIN: 6.5 g/dL (ref 6.0–8.3)
Total Bilirubin: 0.5 mg/dL (ref 0.3–1.2)

## 2013-11-24 LAB — FOLATE

## 2013-11-24 LAB — HEMOGLOBIN A1C
Hgb A1c MFr Bld: 4.7 % (ref <5.7)
Mean Plasma Glucose: 88 mg/dL (ref <117)

## 2013-11-24 LAB — PHOSPHORUS: Phosphorus: 4 mg/dL (ref 2.3–4.6)

## 2013-11-24 LAB — MAGNESIUM: MAGNESIUM: 2.2 mg/dL (ref 1.5–2.5)

## 2013-11-24 LAB — IRON: Iron: 55 ug/dL (ref 42–135)

## 2013-11-24 LAB — SEDIMENTATION RATE: Sed Rate: 47 mm/hr — ABNORMAL HIGH (ref 0–16)

## 2013-11-24 LAB — PROTIME-INR
INR: 1.06 (ref 0.00–1.49)
PROTHROMBIN TIME: 13.9 s (ref 11.6–15.2)

## 2013-11-24 LAB — C-REACTIVE PROTEIN: CRP: 2.4 mg/dL — AB (ref <0.60)

## 2013-11-24 LAB — VITAMIN B12: Vitamin B-12: 502 pg/mL (ref 211–911)

## 2013-11-24 LAB — PROCALCITONIN: Procalcitonin: <0.1 ng/mL

## 2013-11-24 LAB — T4, FREE: Free T4: 1.26 ng/dL (ref 0.80–1.80)

## 2013-11-25 LAB — CBC WITH DIFFERENTIAL/PLATELET
Basophils Absolute: 0 10*3/uL (ref 0.0–0.1)
Basophils Relative: 1 % (ref 0–1)
EOS ABS: 0.3 10*3/uL (ref 0.0–0.7)
EOS PCT: 5 % (ref 0–5)
HCT: 34.8 % — ABNORMAL LOW (ref 39.0–52.0)
Hemoglobin: 11 g/dL — ABNORMAL LOW (ref 13.0–17.0)
LYMPHS ABS: 1.8 10*3/uL (ref 0.7–4.0)
Lymphocytes Relative: 32 % (ref 12–46)
MCH: 29.6 pg (ref 26.0–34.0)
MCHC: 31.6 g/dL (ref 30.0–36.0)
MCV: 93.8 fL (ref 78.0–100.0)
Monocytes Absolute: 0.6 10*3/uL (ref 0.1–1.0)
Monocytes Relative: 12 % (ref 3–12)
Neutro Abs: 2.9 10*3/uL (ref 1.7–7.7)
Neutrophils Relative %: 50 % (ref 43–77)
PLATELETS: 202 10*3/uL (ref 150–400)
RBC: 3.71 MIL/uL — AB (ref 4.22–5.81)
RDW: 12.3 % (ref 11.5–15.5)
WBC: 5.5 10*3/uL (ref 4.0–10.5)

## 2013-11-25 LAB — BASIC METABOLIC PANEL
Anion gap: 11 (ref 5–15)
BUN: 19 mg/dL (ref 6–23)
CO2: 28 mEq/L (ref 19–32)
CREATININE: 0.87 mg/dL (ref 0.50–1.35)
Calcium: 9.1 mg/dL (ref 8.4–10.5)
Chloride: 102 mEq/L (ref 96–112)
GFR calc Af Amer: >90 mL/min (ref >90)
GFR, EST NON AFRICAN AMERICAN: 87 mL/min — AB (ref >90)
GLUCOSE: 108 mg/dL — AB (ref 70–99)
Potassium: 4.2 mEq/L (ref 3.7–5.3)
SODIUM: 141 meq/L (ref 137–147)

## 2013-11-26 LAB — VITAMIN D 1,25 DIHYDROXY
VITAMIN D2 1, 25 (OH): 11 pg/mL
Vitamin D 1, 25 (OH)2 Total: 22 pg/mL (ref 18–72)
Vitamin D3 1, 25 (OH)2: 11 pg/mL

## 2013-11-30 LAB — CBC WITH DIFFERENTIAL/PLATELET
BASOS ABS: 0.1 10*3/uL (ref 0.0–0.1)
Basophils Relative: 1 % (ref 0–1)
EOS ABS: 0.2 10*3/uL (ref 0.0–0.7)
EOS PCT: 3 % (ref 0–5)
HCT: 35.9 % — ABNORMAL LOW (ref 39.0–52.0)
Hemoglobin: 11.5 g/dL — ABNORMAL LOW (ref 13.0–17.0)
Lymphocytes Relative: 31 % (ref 12–46)
Lymphs Abs: 1.9 10*3/uL (ref 0.7–4.0)
MCH: 29.9 pg (ref 26.0–34.0)
MCHC: 32 g/dL (ref 30.0–36.0)
MCV: 93.2 fL (ref 78.0–100.0)
Monocytes Absolute: 0.6 10*3/uL (ref 0.1–1.0)
Monocytes Relative: 10 % (ref 3–12)
Neutro Abs: 3.5 10*3/uL (ref 1.7–7.7)
Neutrophils Relative %: 55 % (ref 43–77)
Platelets: 235 10*3/uL (ref 150–400)
RBC: 3.85 MIL/uL — ABNORMAL LOW (ref 4.22–5.81)
RDW: 12.6 % (ref 11.5–15.5)
WBC: 6.3 10*3/uL (ref 4.0–10.5)

## 2013-11-30 LAB — COMPREHENSIVE METABOLIC PANEL
ALT: 16 U/L (ref 0–53)
AST: 19 U/L (ref 0–37)
Albumin: 3 g/dL — ABNORMAL LOW (ref 3.5–5.2)
Alkaline Phosphatase: 54 U/L (ref 39–117)
Anion gap: 14 (ref 5–15)
BUN: 20 mg/dL (ref 6–23)
CALCIUM: 9 mg/dL (ref 8.4–10.5)
CO2: 25 mEq/L (ref 19–32)
Chloride: 101 mEq/L (ref 96–112)
Creatinine, Ser: 0.75 mg/dL (ref 0.50–1.35)
GFR calc Af Amer: >90 mL/min (ref >90)
GFR calc non Af Amer: >90 mL/min (ref >90)
Glucose, Bld: 110 mg/dL — ABNORMAL HIGH (ref 70–99)
Potassium: 4.5 mEq/L (ref 3.7–5.3)
Sodium: 140 mEq/L (ref 137–147)
TOTAL PROTEIN: 6 g/dL (ref 6.0–8.3)
Total Bilirubin: 0.3 mg/dL (ref 0.3–1.2)

## 2013-12-07 LAB — BASIC METABOLIC PANEL
Anion gap: 11 (ref 5–15)
BUN: 23 mg/dL (ref 6–23)
CALCIUM: 9.3 mg/dL (ref 8.4–10.5)
CHLORIDE: 103 meq/L (ref 96–112)
CO2: 29 meq/L (ref 19–32)
Creatinine, Ser: 0.86 mg/dL (ref 0.50–1.35)
GFR calc Af Amer: >90 mL/min (ref >90)
GFR calc non Af Amer: 88 mL/min — ABNORMAL LOW (ref >90)
GLUCOSE: 105 mg/dL — AB (ref 70–99)
Potassium: 4.4 mEq/L (ref 3.7–5.3)
SODIUM: 143 meq/L (ref 137–147)

## 2013-12-07 LAB — CBC
HCT: 38.3 % — ABNORMAL LOW (ref 39.0–52.0)
HEMOGLOBIN: 12.3 g/dL — AB (ref 13.0–17.0)
MCH: 30.6 pg (ref 26.0–34.0)
MCHC: 32.1 g/dL (ref 30.0–36.0)
MCV: 95.3 fL (ref 78.0–100.0)
Platelets: 227 10*3/uL (ref 150–400)
RBC: 4.02 MIL/uL — AB (ref 4.22–5.81)
RDW: 12.8 % (ref 11.5–15.5)
WBC: 5.6 10*3/uL (ref 4.0–10.5)

## 2013-12-14 LAB — BASIC METABOLIC PANEL
Anion gap: 10 (ref 5–15)
BUN: 17 mg/dL (ref 6–23)
CHLORIDE: 102 meq/L (ref 96–112)
CO2: 28 mEq/L (ref 19–32)
CREATININE: 0.79 mg/dL (ref 0.50–1.35)
Calcium: 9.2 mg/dL (ref 8.4–10.5)
GFR calc non Af Amer: >90 mL/min (ref >90)
Glucose, Bld: 107 mg/dL — ABNORMAL HIGH (ref 70–99)
POTASSIUM: 4.4 meq/L (ref 3.7–5.3)
Sodium: 140 mEq/L (ref 137–147)

## 2013-12-14 LAB — CBC
HEMATOCRIT: 38.6 % — AB (ref 39.0–52.0)
Hemoglobin: 12.4 g/dL — ABNORMAL LOW (ref 13.0–17.0)
MCH: 29.8 pg (ref 26.0–34.0)
MCHC: 32.1 g/dL (ref 30.0–36.0)
MCV: 92.8 fL (ref 78.0–100.0)
Platelets: 195 10*3/uL (ref 150–400)
RBC: 4.16 MIL/uL — ABNORMAL LOW (ref 4.22–5.81)
RDW: 12.7 % (ref 11.5–15.5)
WBC: 5.6 10*3/uL (ref 4.0–10.5)

## 2013-12-15 DIAGNOSIS — S82401D Unspecified fracture of shaft of right fibula, subsequent encounter for closed fracture with routine healing: Secondary | ICD-10-CM | POA: Diagnosis not present

## 2013-12-15 DIAGNOSIS — Z48817 Encounter for surgical aftercare following surgery on the skin and subcutaneous tissue: Secondary | ICD-10-CM | POA: Diagnosis not present

## 2013-12-15 DIAGNOSIS — S82201D Unspecified fracture of shaft of right tibia, subsequent encounter for closed fracture with routine healing: Secondary | ICD-10-CM | POA: Diagnosis not present

## 2013-12-15 DIAGNOSIS — E46 Unspecified protein-calorie malnutrition: Secondary | ICD-10-CM | POA: Diagnosis not present

## 2013-12-15 DIAGNOSIS — F329 Major depressive disorder, single episode, unspecified: Secondary | ICD-10-CM | POA: Diagnosis not present

## 2013-12-15 DIAGNOSIS — F419 Anxiety disorder, unspecified: Secondary | ICD-10-CM | POA: Diagnosis not present

## 2013-12-16 DIAGNOSIS — F419 Anxiety disorder, unspecified: Secondary | ICD-10-CM | POA: Diagnosis not present

## 2013-12-16 DIAGNOSIS — S82201D Unspecified fracture of shaft of right tibia, subsequent encounter for closed fracture with routine healing: Secondary | ICD-10-CM | POA: Diagnosis not present

## 2013-12-16 DIAGNOSIS — Z48817 Encounter for surgical aftercare following surgery on the skin and subcutaneous tissue: Secondary | ICD-10-CM | POA: Diagnosis not present

## 2013-12-16 DIAGNOSIS — E46 Unspecified protein-calorie malnutrition: Secondary | ICD-10-CM | POA: Diagnosis not present

## 2013-12-16 DIAGNOSIS — S82401D Unspecified fracture of shaft of right fibula, subsequent encounter for closed fracture with routine healing: Secondary | ICD-10-CM | POA: Diagnosis not present

## 2013-12-16 DIAGNOSIS — F329 Major depressive disorder, single episode, unspecified: Secondary | ICD-10-CM | POA: Diagnosis not present

## 2013-12-17 DIAGNOSIS — S82201D Unspecified fracture of shaft of right tibia, subsequent encounter for closed fracture with routine healing: Secondary | ICD-10-CM | POA: Diagnosis not present

## 2013-12-17 DIAGNOSIS — F329 Major depressive disorder, single episode, unspecified: Secondary | ICD-10-CM | POA: Diagnosis not present

## 2013-12-17 DIAGNOSIS — F419 Anxiety disorder, unspecified: Secondary | ICD-10-CM | POA: Diagnosis not present

## 2013-12-17 DIAGNOSIS — Z48817 Encounter for surgical aftercare following surgery on the skin and subcutaneous tissue: Secondary | ICD-10-CM | POA: Diagnosis not present

## 2013-12-17 DIAGNOSIS — E46 Unspecified protein-calorie malnutrition: Secondary | ICD-10-CM | POA: Diagnosis not present

## 2013-12-17 DIAGNOSIS — S82401D Unspecified fracture of shaft of right fibula, subsequent encounter for closed fracture with routine healing: Secondary | ICD-10-CM | POA: Diagnosis not present

## 2013-12-18 DIAGNOSIS — E46 Unspecified protein-calorie malnutrition: Secondary | ICD-10-CM | POA: Diagnosis not present

## 2013-12-18 DIAGNOSIS — Z48817 Encounter for surgical aftercare following surgery on the skin and subcutaneous tissue: Secondary | ICD-10-CM | POA: Diagnosis not present

## 2013-12-18 DIAGNOSIS — F419 Anxiety disorder, unspecified: Secondary | ICD-10-CM | POA: Diagnosis not present

## 2013-12-18 DIAGNOSIS — F329 Major depressive disorder, single episode, unspecified: Secondary | ICD-10-CM | POA: Diagnosis not present

## 2013-12-18 DIAGNOSIS — S82401D Unspecified fracture of shaft of right fibula, subsequent encounter for closed fracture with routine healing: Secondary | ICD-10-CM | POA: Diagnosis not present

## 2013-12-18 DIAGNOSIS — S82201D Unspecified fracture of shaft of right tibia, subsequent encounter for closed fracture with routine healing: Secondary | ICD-10-CM | POA: Diagnosis not present

## 2013-12-21 DIAGNOSIS — S82391F Other fracture of lower end of right tibia, subsequent encounter for open fracture type IIIA, IIIB, or IIIC with routine healing: Secondary | ICD-10-CM | POA: Diagnosis not present

## 2013-12-22 DIAGNOSIS — S82201D Unspecified fracture of shaft of right tibia, subsequent encounter for closed fracture with routine healing: Secondary | ICD-10-CM | POA: Diagnosis not present

## 2013-12-22 DIAGNOSIS — S82401D Unspecified fracture of shaft of right fibula, subsequent encounter for closed fracture with routine healing: Secondary | ICD-10-CM | POA: Diagnosis not present

## 2013-12-22 DIAGNOSIS — E46 Unspecified protein-calorie malnutrition: Secondary | ICD-10-CM | POA: Diagnosis not present

## 2013-12-22 DIAGNOSIS — F329 Major depressive disorder, single episode, unspecified: Secondary | ICD-10-CM | POA: Diagnosis not present

## 2013-12-22 DIAGNOSIS — Z48817 Encounter for surgical aftercare following surgery on the skin and subcutaneous tissue: Secondary | ICD-10-CM | POA: Diagnosis not present

## 2013-12-22 DIAGNOSIS — F419 Anxiety disorder, unspecified: Secondary | ICD-10-CM | POA: Diagnosis not present

## 2013-12-23 DIAGNOSIS — F329 Major depressive disorder, single episode, unspecified: Secondary | ICD-10-CM | POA: Diagnosis not present

## 2013-12-23 DIAGNOSIS — S82201D Unspecified fracture of shaft of right tibia, subsequent encounter for closed fracture with routine healing: Secondary | ICD-10-CM | POA: Diagnosis not present

## 2013-12-23 DIAGNOSIS — Z48817 Encounter for surgical aftercare following surgery on the skin and subcutaneous tissue: Secondary | ICD-10-CM | POA: Diagnosis not present

## 2013-12-23 DIAGNOSIS — F419 Anxiety disorder, unspecified: Secondary | ICD-10-CM | POA: Diagnosis not present

## 2013-12-23 DIAGNOSIS — S82401D Unspecified fracture of shaft of right fibula, subsequent encounter for closed fracture with routine healing: Secondary | ICD-10-CM | POA: Diagnosis not present

## 2013-12-23 DIAGNOSIS — E46 Unspecified protein-calorie malnutrition: Secondary | ICD-10-CM | POA: Diagnosis not present

## 2013-12-24 DIAGNOSIS — Z48817 Encounter for surgical aftercare following surgery on the skin and subcutaneous tissue: Secondary | ICD-10-CM | POA: Diagnosis not present

## 2013-12-24 DIAGNOSIS — F419 Anxiety disorder, unspecified: Secondary | ICD-10-CM | POA: Diagnosis not present

## 2013-12-24 DIAGNOSIS — F329 Major depressive disorder, single episode, unspecified: Secondary | ICD-10-CM | POA: Diagnosis not present

## 2013-12-24 DIAGNOSIS — S82401D Unspecified fracture of shaft of right fibula, subsequent encounter for closed fracture with routine healing: Secondary | ICD-10-CM | POA: Diagnosis not present

## 2013-12-24 DIAGNOSIS — S82201D Unspecified fracture of shaft of right tibia, subsequent encounter for closed fracture with routine healing: Secondary | ICD-10-CM | POA: Diagnosis not present

## 2013-12-24 DIAGNOSIS — E46 Unspecified protein-calorie malnutrition: Secondary | ICD-10-CM | POA: Diagnosis not present

## 2013-12-28 DIAGNOSIS — S82201D Unspecified fracture of shaft of right tibia, subsequent encounter for closed fracture with routine healing: Secondary | ICD-10-CM | POA: Diagnosis not present

## 2013-12-28 DIAGNOSIS — F329 Major depressive disorder, single episode, unspecified: Secondary | ICD-10-CM | POA: Diagnosis not present

## 2013-12-28 DIAGNOSIS — F419 Anxiety disorder, unspecified: Secondary | ICD-10-CM | POA: Diagnosis not present

## 2013-12-28 DIAGNOSIS — E46 Unspecified protein-calorie malnutrition: Secondary | ICD-10-CM | POA: Diagnosis not present

## 2013-12-28 DIAGNOSIS — Z48817 Encounter for surgical aftercare following surgery on the skin and subcutaneous tissue: Secondary | ICD-10-CM | POA: Diagnosis not present

## 2013-12-28 DIAGNOSIS — S82401D Unspecified fracture of shaft of right fibula, subsequent encounter for closed fracture with routine healing: Secondary | ICD-10-CM | POA: Diagnosis not present

## 2013-12-30 DIAGNOSIS — F329 Major depressive disorder, single episode, unspecified: Secondary | ICD-10-CM | POA: Diagnosis not present

## 2013-12-30 DIAGNOSIS — Z48817 Encounter for surgical aftercare following surgery on the skin and subcutaneous tissue: Secondary | ICD-10-CM | POA: Diagnosis not present

## 2013-12-30 DIAGNOSIS — S82201D Unspecified fracture of shaft of right tibia, subsequent encounter for closed fracture with routine healing: Secondary | ICD-10-CM | POA: Diagnosis not present

## 2013-12-30 DIAGNOSIS — S82401D Unspecified fracture of shaft of right fibula, subsequent encounter for closed fracture with routine healing: Secondary | ICD-10-CM | POA: Diagnosis not present

## 2013-12-30 DIAGNOSIS — F419 Anxiety disorder, unspecified: Secondary | ICD-10-CM | POA: Diagnosis not present

## 2013-12-30 DIAGNOSIS — E46 Unspecified protein-calorie malnutrition: Secondary | ICD-10-CM | POA: Diagnosis not present

## 2013-12-31 DIAGNOSIS — E46 Unspecified protein-calorie malnutrition: Secondary | ICD-10-CM | POA: Diagnosis not present

## 2013-12-31 DIAGNOSIS — S82401D Unspecified fracture of shaft of right fibula, subsequent encounter for closed fracture with routine healing: Secondary | ICD-10-CM | POA: Diagnosis not present

## 2013-12-31 DIAGNOSIS — Z48817 Encounter for surgical aftercare following surgery on the skin and subcutaneous tissue: Secondary | ICD-10-CM | POA: Diagnosis not present

## 2013-12-31 DIAGNOSIS — F329 Major depressive disorder, single episode, unspecified: Secondary | ICD-10-CM | POA: Diagnosis not present

## 2013-12-31 DIAGNOSIS — F419 Anxiety disorder, unspecified: Secondary | ICD-10-CM | POA: Diagnosis not present

## 2013-12-31 DIAGNOSIS — S82201D Unspecified fracture of shaft of right tibia, subsequent encounter for closed fracture with routine healing: Secondary | ICD-10-CM | POA: Diagnosis not present

## 2014-01-01 HISTORY — PX: ORIF TIBIA FRACTURE: SHX5416

## 2014-01-05 DIAGNOSIS — Z48817 Encounter for surgical aftercare following surgery on the skin and subcutaneous tissue: Secondary | ICD-10-CM | POA: Diagnosis not present

## 2014-01-05 DIAGNOSIS — F419 Anxiety disorder, unspecified: Secondary | ICD-10-CM | POA: Diagnosis not present

## 2014-01-05 DIAGNOSIS — S82401D Unspecified fracture of shaft of right fibula, subsequent encounter for closed fracture with routine healing: Secondary | ICD-10-CM | POA: Diagnosis not present

## 2014-01-05 DIAGNOSIS — E46 Unspecified protein-calorie malnutrition: Secondary | ICD-10-CM | POA: Diagnosis not present

## 2014-01-05 DIAGNOSIS — S82201D Unspecified fracture of shaft of right tibia, subsequent encounter for closed fracture with routine healing: Secondary | ICD-10-CM | POA: Diagnosis not present

## 2014-01-05 DIAGNOSIS — F329 Major depressive disorder, single episode, unspecified: Secondary | ICD-10-CM | POA: Diagnosis not present

## 2014-01-07 DIAGNOSIS — F419 Anxiety disorder, unspecified: Secondary | ICD-10-CM | POA: Diagnosis not present

## 2014-01-07 DIAGNOSIS — Z48817 Encounter for surgical aftercare following surgery on the skin and subcutaneous tissue: Secondary | ICD-10-CM | POA: Diagnosis not present

## 2014-01-07 DIAGNOSIS — F329 Major depressive disorder, single episode, unspecified: Secondary | ICD-10-CM | POA: Diagnosis not present

## 2014-01-07 DIAGNOSIS — S82201D Unspecified fracture of shaft of right tibia, subsequent encounter for closed fracture with routine healing: Secondary | ICD-10-CM | POA: Diagnosis not present

## 2014-01-07 DIAGNOSIS — E46 Unspecified protein-calorie malnutrition: Secondary | ICD-10-CM | POA: Diagnosis not present

## 2014-01-07 DIAGNOSIS — S82401D Unspecified fracture of shaft of right fibula, subsequent encounter for closed fracture with routine healing: Secondary | ICD-10-CM | POA: Diagnosis not present

## 2014-01-08 DIAGNOSIS — F419 Anxiety disorder, unspecified: Secondary | ICD-10-CM | POA: Diagnosis not present

## 2014-01-08 DIAGNOSIS — E46 Unspecified protein-calorie malnutrition: Secondary | ICD-10-CM | POA: Diagnosis not present

## 2014-01-08 DIAGNOSIS — F329 Major depressive disorder, single episode, unspecified: Secondary | ICD-10-CM | POA: Diagnosis not present

## 2014-01-08 DIAGNOSIS — Z48817 Encounter for surgical aftercare following surgery on the skin and subcutaneous tissue: Secondary | ICD-10-CM | POA: Diagnosis not present

## 2014-01-08 DIAGNOSIS — S82201D Unspecified fracture of shaft of right tibia, subsequent encounter for closed fracture with routine healing: Secondary | ICD-10-CM | POA: Diagnosis not present

## 2014-01-08 DIAGNOSIS — Z945 Skin transplant status: Secondary | ICD-10-CM | POA: Diagnosis not present

## 2014-01-08 DIAGNOSIS — Z9889 Other specified postprocedural states: Secondary | ICD-10-CM | POA: Diagnosis not present

## 2014-01-08 DIAGNOSIS — S82401D Unspecified fracture of shaft of right fibula, subsequent encounter for closed fracture with routine healing: Secondary | ICD-10-CM | POA: Diagnosis not present

## 2014-01-11 ENCOUNTER — Other Ambulatory Visit: Payer: Self-pay | Admitting: Orthopedic Surgery

## 2014-01-11 DIAGNOSIS — S82391F Other fracture of lower end of right tibia, subsequent encounter for open fracture type IIIA, IIIB, or IIIC with routine healing: Secondary | ICD-10-CM | POA: Diagnosis not present

## 2014-01-11 DIAGNOSIS — M25571 Pain in right ankle and joints of right foot: Secondary | ICD-10-CM

## 2014-01-12 DIAGNOSIS — F329 Major depressive disorder, single episode, unspecified: Secondary | ICD-10-CM | POA: Diagnosis not present

## 2014-01-12 DIAGNOSIS — Z48817 Encounter for surgical aftercare following surgery on the skin and subcutaneous tissue: Secondary | ICD-10-CM | POA: Diagnosis not present

## 2014-01-12 DIAGNOSIS — S82201D Unspecified fracture of shaft of right tibia, subsequent encounter for closed fracture with routine healing: Secondary | ICD-10-CM | POA: Diagnosis not present

## 2014-01-12 DIAGNOSIS — F419 Anxiety disorder, unspecified: Secondary | ICD-10-CM | POA: Diagnosis not present

## 2014-01-12 DIAGNOSIS — E46 Unspecified protein-calorie malnutrition: Secondary | ICD-10-CM | POA: Diagnosis not present

## 2014-01-12 DIAGNOSIS — S82401D Unspecified fracture of shaft of right fibula, subsequent encounter for closed fracture with routine healing: Secondary | ICD-10-CM | POA: Diagnosis not present

## 2014-01-13 ENCOUNTER — Ambulatory Visit
Admission: RE | Admit: 2014-01-13 | Discharge: 2014-01-13 | Disposition: A | Discharge status: Home / Self Care | Payer: Medicare Other | Source: Ambulatory Visit | Attending: Orthopedic Surgery | Admitting: Orthopedic Surgery

## 2014-01-13 DIAGNOSIS — S82301K Unspecified fracture of lower end of right tibia, subsequent encounter for closed fracture with nonunion: Secondary | ICD-10-CM | POA: Diagnosis not present

## 2014-01-13 DIAGNOSIS — S82831K Other fracture of upper and lower end of right fibula, subsequent encounter for closed fracture with nonunion: Secondary | ICD-10-CM | POA: Diagnosis not present

## 2014-01-13 DIAGNOSIS — M25571 Pain in right ankle and joints of right foot: Secondary | ICD-10-CM

## 2014-01-14 DIAGNOSIS — S82201D Unspecified fracture of shaft of right tibia, subsequent encounter for closed fracture with routine healing: Secondary | ICD-10-CM | POA: Diagnosis not present

## 2014-01-14 DIAGNOSIS — F329 Major depressive disorder, single episode, unspecified: Secondary | ICD-10-CM | POA: Diagnosis not present

## 2014-01-14 DIAGNOSIS — S82401D Unspecified fracture of shaft of right fibula, subsequent encounter for closed fracture with routine healing: Secondary | ICD-10-CM | POA: Diagnosis not present

## 2014-01-14 DIAGNOSIS — Z48817 Encounter for surgical aftercare following surgery on the skin and subcutaneous tissue: Secondary | ICD-10-CM | POA: Diagnosis not present

## 2014-01-14 DIAGNOSIS — E46 Unspecified protein-calorie malnutrition: Secondary | ICD-10-CM | POA: Diagnosis not present

## 2014-01-14 DIAGNOSIS — F419 Anxiety disorder, unspecified: Secondary | ICD-10-CM | POA: Diagnosis not present

## 2014-01-15 DIAGNOSIS — F419 Anxiety disorder, unspecified: Secondary | ICD-10-CM | POA: Diagnosis not present

## 2014-01-15 DIAGNOSIS — S82201D Unspecified fracture of shaft of right tibia, subsequent encounter for closed fracture with routine healing: Secondary | ICD-10-CM | POA: Diagnosis not present

## 2014-01-15 DIAGNOSIS — F329 Major depressive disorder, single episode, unspecified: Secondary | ICD-10-CM | POA: Diagnosis not present

## 2014-01-15 DIAGNOSIS — E46 Unspecified protein-calorie malnutrition: Secondary | ICD-10-CM | POA: Diagnosis not present

## 2014-01-15 DIAGNOSIS — Z48817 Encounter for surgical aftercare following surgery on the skin and subcutaneous tissue: Secondary | ICD-10-CM | POA: Diagnosis not present

## 2014-01-15 DIAGNOSIS — S82401D Unspecified fracture of shaft of right fibula, subsequent encounter for closed fracture with routine healing: Secondary | ICD-10-CM | POA: Diagnosis not present

## 2014-01-18 ENCOUNTER — Encounter (HOSPITAL_COMMUNITY): Payer: Self-pay | Admitting: *Deleted

## 2014-01-18 MED ORDER — LACTATED RINGERS IV SOLN: INTRAVENOUS | Status: DC

## 2014-01-18 MED ORDER — VANCOMYCIN HCL IN DEXTROSE 1-5 GM/200ML-% IV SOLN
1000.0000 mg | INTRAVENOUS | Status: AC
  Administered 2014-01-19: 1000 mg via INTRAVENOUS
  Filled 2014-01-18: qty 200

## 2014-01-18 NOTE — Progress Notes (Signed)
   01/18/14 1735  OBSTRUCTIVE SLEEP APNEA  Have you ever been diagnosed with sleep apnea through a sleep study? No  Do you snore loudly (loud enough to be heard through closed doors)?  1  Do you often feel tired, fatigued, or sleepy during the daytime? 1  Has anyone observed you stop breathing during your sleep? 1  Do you have, or are you being treated for high blood pressure? 0  BMI more than 35 kg/m2? 0  Age over 68 years old? 1  Neck circumference greater than 40 cm/16 inches? 0 (16)  Gender: 1  Obstructive Sleep Apnea Score 5

## 2014-01-19 ENCOUNTER — Inpatient Hospital Stay (HOSPITAL_COMMUNITY): Payer: Medicare Other | Admitting: Certified Registered Nurse Anesthetist

## 2014-01-19 ENCOUNTER — Inpatient Hospital Stay (HOSPITAL_COMMUNITY)
Admission: RE | Admit: 2014-01-19 | Discharge: 2014-01-21 | DRG: 494 | Disposition: A | Discharge status: Home Health | Payer: Medicare Other | Source: Ambulatory Visit | Attending: Orthopedic Surgery | Admitting: Orthopedic Surgery

## 2014-01-19 ENCOUNTER — Encounter (HOSPITAL_COMMUNITY): Payer: Self-pay | Admitting: Surgery

## 2014-01-19 ENCOUNTER — Encounter (HOSPITAL_COMMUNITY)
Admission: RE | Disposition: A | Discharge status: Home Health | Payer: Self-pay | Source: Ambulatory Visit | Attending: Orthopedic Surgery

## 2014-01-19 ENCOUNTER — Inpatient Hospital Stay (HOSPITAL_COMMUNITY): Payer: Medicare Other

## 2014-01-19 DIAGNOSIS — S82301A Unspecified fracture of lower end of right tibia, initial encounter for closed fracture: Secondary | ICD-10-CM | POA: Insufficient documentation

## 2014-01-19 DIAGNOSIS — Z79891 Long term (current) use of opiate analgesic: Secondary | ICD-10-CM | POA: Diagnosis not present

## 2014-01-19 DIAGNOSIS — S82301D Unspecified fracture of lower end of right tibia, subsequent encounter for closed fracture with routine healing: Secondary | ICD-10-CM | POA: Diagnosis not present

## 2014-01-19 DIAGNOSIS — S82391N Other fracture of lower end of right tibia, subsequent encounter for open fracture type IIIA, IIIB, or IIIC with nonunion: Secondary | ICD-10-CM | POA: Diagnosis not present

## 2014-01-19 DIAGNOSIS — S82871K Displaced pilon fracture of right tibia, subsequent encounter for closed fracture with nonunion: Principal | ICD-10-CM

## 2014-01-19 DIAGNOSIS — F419 Anxiety disorder, unspecified: Secondary | ICD-10-CM | POA: Diagnosis present

## 2014-01-19 DIAGNOSIS — G8918 Other acute postprocedural pain: Secondary | ICD-10-CM | POA: Diagnosis not present

## 2014-01-19 DIAGNOSIS — Z881 Allergy status to other antibiotic agents status: Secondary | ICD-10-CM | POA: Diagnosis not present

## 2014-01-19 DIAGNOSIS — S82832F Other fracture of upper and lower end of left fibula, subsequent encounter for open fracture type IIIA, IIIB, or IIIC with routine healing: Secondary | ICD-10-CM

## 2014-01-19 DIAGNOSIS — Z7982 Long term (current) use of aspirin: Secondary | ICD-10-CM

## 2014-01-19 DIAGNOSIS — S82872F Displaced pilon fracture of left tibia, subsequent encounter for open fracture type IIIA, IIIB, or IIIC with routine healing: Secondary | ICD-10-CM

## 2014-01-19 DIAGNOSIS — Z87891 Personal history of nicotine dependence: Secondary | ICD-10-CM | POA: Diagnosis not present

## 2014-01-19 DIAGNOSIS — F329 Major depressive disorder, single episode, unspecified: Secondary | ICD-10-CM | POA: Diagnosis present

## 2014-01-19 DIAGNOSIS — S82871D Displaced pilon fracture of right tibia, subsequent encounter for closed fracture with routine healing: Secondary | ICD-10-CM | POA: Diagnosis not present

## 2014-01-19 DIAGNOSIS — S82391M Other fracture of lower end of right tibia, subsequent encounter for open fracture type I or II with nonunion: Secondary | ICD-10-CM | POA: Diagnosis present

## 2014-01-19 DIAGNOSIS — Z91010 Allergy to peanuts: Secondary | ICD-10-CM

## 2014-01-19 DIAGNOSIS — S82871M Displaced pilon fracture of right tibia, subsequent encounter for open fracture type I or II with nonunion: Secondary | ICD-10-CM | POA: Diagnosis not present

## 2014-01-19 DIAGNOSIS — Z79899 Other long term (current) drug therapy: Secondary | ICD-10-CM

## 2014-01-19 DIAGNOSIS — Z419 Encounter for procedure for purposes other than remedying health state, unspecified: Secondary | ICD-10-CM

## 2014-01-19 DIAGNOSIS — K219 Gastro-esophageal reflux disease without esophagitis: Secondary | ICD-10-CM | POA: Diagnosis present

## 2014-01-19 DIAGNOSIS — F32A Depression, unspecified: Secondary | ICD-10-CM | POA: Diagnosis present

## 2014-01-19 DIAGNOSIS — S82301K Unspecified fracture of lower end of right tibia, subsequent encounter for closed fracture with nonunion: Secondary | ICD-10-CM | POA: Diagnosis not present

## 2014-01-19 DIAGNOSIS — S82301N Unspecified fracture of lower end of right tibia, subsequent encounter for open fracture type IIIA, IIIB, or IIIC with nonunion: Secondary | ICD-10-CM

## 2014-01-19 HISTORY — PX: ORIF TIBIA FRACTURE: SHX5416

## 2014-01-19 HISTORY — DX: Anxiety disorder, unspecified: F41.9

## 2014-01-19 HISTORY — DX: Depression, unspecified: F32.A

## 2014-01-19 HISTORY — DX: Pneumonia, unspecified organism: J18.9

## 2014-01-19 HISTORY — DX: Major depressive disorder, single episode, unspecified: F32.9

## 2014-01-19 LAB — GRAM STAIN: GRAM STAIN: NONE SEEN

## 2014-01-19 LAB — TYPE AND SCREEN
ABO/RH(D): B POS
Antibody Screen: NEGATIVE

## 2014-01-19 LAB — COMPREHENSIVE METABOLIC PANEL
ALT: 13 U/L (ref 0–53)
AST: 20 U/L (ref 0–37)
Albumin: 3.8 g/dL (ref 3.5–5.2)
Alkaline Phosphatase: 57 U/L (ref 39–117)
Anion gap: 9 (ref 5–15)
BUN: 11 mg/dL (ref 6–23)
CHLORIDE: 107 meq/L (ref 96–112)
CO2: 25 mmol/L (ref 19–32)
CREATININE: 0.78 mg/dL (ref 0.50–1.35)
Calcium: 9.3 mg/dL (ref 8.4–10.5)
GFR calc Af Amer: >90 mL/min (ref >90)
GFR calc non Af Amer: >90 mL/min (ref >90)
Glucose, Bld: 106 mg/dL — ABNORMAL HIGH (ref 70–99)
Potassium: 4.2 mmol/L (ref 3.5–5.1)
SODIUM: 141 mmol/L (ref 135–145)
TOTAL PROTEIN: 6.3 g/dL (ref 6.0–8.3)
Total Bilirubin: 0.6 mg/dL (ref 0.3–1.2)

## 2014-01-19 LAB — CBC WITH DIFFERENTIAL/PLATELET
Basophils Absolute: 0 10*3/uL (ref 0.0–0.1)
Basophils Relative: 1 % (ref 0–1)
EOS PCT: 2 % (ref 0–5)
Eosinophils Absolute: 0.1 10*3/uL (ref 0.0–0.7)
HCT: 41.9 % (ref 39.0–52.0)
HEMOGLOBIN: 13.7 g/dL (ref 13.0–17.0)
LYMPHS ABS: 1.4 10*3/uL (ref 0.7–4.0)
Lymphocytes Relative: 23 % (ref 12–46)
MCH: 28.8 pg (ref 26.0–34.0)
MCHC: 32.7 g/dL (ref 30.0–36.0)
MCV: 88.2 fL (ref 78.0–100.0)
MONOS PCT: 10 % (ref 3–12)
Monocytes Absolute: 0.6 10*3/uL (ref 0.1–1.0)
Neutro Abs: 3.8 10*3/uL (ref 1.7–7.7)
Neutrophils Relative %: 64 % (ref 43–77)
Platelets: 216 10*3/uL (ref 150–400)
RBC: 4.75 MIL/uL (ref 4.22–5.81)
RDW: 13.3 % (ref 11.5–15.5)
WBC: 5.9 10*3/uL (ref 4.0–10.5)

## 2014-01-19 LAB — PROTIME-INR
INR: 1.02 (ref 0.00–1.49)
PROTHROMBIN TIME: 13.6 s (ref 11.6–15.2)

## 2014-01-19 LAB — ABO/RH: ABO/RH(D): B POS

## 2014-01-19 LAB — APTT: aPTT: 29 seconds (ref 24–37)

## 2014-01-19 SURGERY — OPEN REDUCTION INTERNAL FIXATION (ORIF) TIBIA FRACTURE
Anesthesia: Regional | Laterality: Right

## 2014-01-19 MED ORDER — ADULT MULTIVITAMIN W/MINERALS CH
1.0000 | ORAL_TABLET | Freq: Every morning | ORAL | Status: DC
  Administered 2014-01-19 – 2014-01-21 (×3): 1 via ORAL
  Filled 2014-01-19 (×4): qty 1

## 2014-01-19 MED ORDER — NEOSTIGMINE METHYLSULFATE 10 MG/10ML IV SOLN
INTRAVENOUS | Status: DC | PRN
  Administered 2014-01-19: 3 mg via INTRAMUSCULAR

## 2014-01-19 MED ORDER — FENTANYL CITRATE 0.05 MG/ML IJ SOLN
INTRAMUSCULAR | Status: DC | PRN
  Administered 2014-01-19: 50 ug via INTRAVENOUS
  Administered 2014-01-19 (×2): 25 ug via INTRAVENOUS
  Administered 2014-01-19: 100 ug via INTRAVENOUS
  Administered 2014-01-19: 50 ug via INTRAVENOUS

## 2014-01-19 MED ORDER — EPHEDRINE SULFATE 50 MG/ML IJ SOLN
INTRAMUSCULAR | Status: DC | PRN
  Administered 2014-01-19: 5 mg via INTRAVENOUS
  Administered 2014-01-19 (×3): 10 mg via INTRAVENOUS
  Administered 2014-01-19: 5 mg via INTRAVENOUS

## 2014-01-19 MED ORDER — METHOCARBAMOL 500 MG PO TABS
500.0000 mg | ORAL_TABLET | Freq: Four times a day (QID) | ORAL | Status: DC | PRN
  Administered 2014-01-20 – 2014-01-21 (×4): 500 mg via ORAL
  Filled 2014-01-19 (×4): qty 1

## 2014-01-19 MED ORDER — ONDANSETRON HCL 4 MG/2ML IJ SOLN: 4.0000 mg | Freq: Four times a day (QID) | INTRAMUSCULAR | Status: DC | PRN

## 2014-01-19 MED ORDER — VANCOMYCIN HCL IN DEXTROSE 1-5 GM/200ML-% IV SOLN
1000.0000 mg | Freq: Two times a day (BID) | INTRAVENOUS | Status: DC
  Filled 2014-01-19: qty 200

## 2014-01-19 MED ORDER — ASPIRIN 81 MG PO CHEW
81.0000 mg | CHEWABLE_TABLET | Freq: Every morning | ORAL | Status: DC
  Administered 2014-01-20 – 2014-01-21 (×2): 81 mg via ORAL
  Filled 2014-01-19 (×3): qty 1

## 2014-01-19 MED ORDER — NEOSTIGMINE METHYLSULFATE 10 MG/10ML IV SOLN: INTRAVENOUS | Status: DC | PRN

## 2014-01-19 MED ORDER — LACTATED RINGERS IV SOLN
INTRAVENOUS | Status: DC | PRN
  Administered 2014-01-19 (×2): via INTRAVENOUS

## 2014-01-19 MED ORDER — ONDANSETRON HCL 4 MG/2ML IJ SOLN
INTRAMUSCULAR | Status: DC | PRN
  Administered 2014-01-19: 4 mg via INTRAVENOUS

## 2014-01-19 MED ORDER — ONDANSETRON HCL 4 MG PO TABS: 4.0000 mg | ORAL_TABLET | Freq: Four times a day (QID) | ORAL | Status: DC | PRN

## 2014-01-19 MED ORDER — LACTATED RINGERS IV SOLN
INTRAVENOUS | Status: DC
  Administered 2014-01-19: 08:00:00 via INTRAVENOUS

## 2014-01-19 MED ORDER — VECURONIUM BROMIDE 10 MG IV SOLR
INTRAVENOUS | Status: DC | PRN
  Administered 2014-01-19 (×3): 2 mg via INTRAVENOUS

## 2014-01-19 MED ORDER — DOCUSATE SODIUM 100 MG PO CAPS
100.0000 mg | ORAL_CAPSULE | Freq: Two times a day (BID) | ORAL | Status: DC
  Administered 2014-01-19 – 2014-01-21 (×4): 100 mg via ORAL
  Filled 2014-01-19 (×4): qty 1

## 2014-01-19 MED ORDER — MIDAZOLAM HCL 2 MG/2ML IJ SOLN
INTRAMUSCULAR | Status: AC
  Filled 2014-01-19: qty 2

## 2014-01-19 MED ORDER — VANCOMYCIN HCL IN DEXTROSE 1-5 GM/200ML-% IV SOLN
1000.0000 mg | Freq: Two times a day (BID) | INTRAVENOUS | Status: AC
  Administered 2014-01-19: 1000 mg via INTRAVENOUS
  Filled 2014-01-19: qty 200

## 2014-01-19 MED ORDER — PREGABALIN 75 MG PO CAPS
75.0000 mg | ORAL_CAPSULE | Freq: Two times a day (BID) | ORAL | Status: DC
  Administered 2014-01-19 – 2014-01-21 (×4): 75 mg via ORAL
  Filled 2014-01-19 (×4): qty 1

## 2014-01-19 MED ORDER — VENLAFAXINE HCL 37.5 MG PO TABS
37.5000 mg | ORAL_TABLET | Freq: Every day | ORAL | Status: DC
  Administered 2014-01-19 – 2014-01-20 (×2): 37.5 mg via ORAL
  Filled 2014-01-19 (×3): qty 1

## 2014-01-19 MED ORDER — METHOCARBAMOL 1000 MG/10ML IJ SOLN
500.0000 mg | Freq: Four times a day (QID) | INTRAMUSCULAR | Status: DC | PRN
  Filled 2014-01-19: qty 5

## 2014-01-19 MED ORDER — OXYCODONE HCL 5 MG PO TABS
5.0000 mg | ORAL_TABLET | ORAL | Status: DC | PRN
  Administered 2014-01-19 – 2014-01-21 (×4): 10 mg via ORAL
  Filled 2014-01-19 (×4): qty 2

## 2014-01-19 MED ORDER — ROCURONIUM BROMIDE 100 MG/10ML IV SOLN
INTRAVENOUS | Status: DC | PRN
  Administered 2014-01-19: 50 mg via INTRAVENOUS

## 2014-01-19 MED ORDER — PROPOFOL 10 MG/ML IV BOLUS
INTRAVENOUS | Status: DC | PRN
  Administered 2014-01-19: 180 mg via INTRAVENOUS

## 2014-01-19 MED ORDER — VANCOMYCIN HCL 1000 MG IV SOLR
1000.0000 mg | INTRAVENOUS | Status: DC | PRN
  Administered 2014-01-19: 1000 mg via INTRAVENOUS

## 2014-01-19 MED ORDER — 0.9 % SODIUM CHLORIDE (POUR BTL) OPTIME
TOPICAL | Status: DC | PRN
  Administered 2014-01-19: 1000 mL

## 2014-01-19 MED ORDER — LIDOCAINE HCL (CARDIAC) 20 MG/ML IV SOLN
INTRAVENOUS | Status: DC | PRN
  Administered 2014-01-19: 70 mg via INTRAVENOUS

## 2014-01-19 MED ORDER — ENOXAPARIN SODIUM 40 MG/0.4ML ~~LOC~~ SOLN
40.0000 mg | SUBCUTANEOUS | Status: DC
  Administered 2014-01-20: 40 mg via SUBCUTANEOUS
  Filled 2014-01-19 (×2): qty 0.4

## 2014-01-19 MED ORDER — METOCLOPRAMIDE HCL 5 MG/ML IJ SOLN: 5.0000 mg | Freq: Three times a day (TID) | INTRAMUSCULAR | Status: DC | PRN

## 2014-01-19 MED ORDER — FENTANYL CITRATE 0.05 MG/ML IJ SOLN
INTRAMUSCULAR | Status: AC
  Filled 2014-01-19: qty 5

## 2014-01-19 MED ORDER — POLYETHYLENE GLYCOL 3350 17 G PO PACK
17.0000 g | PACK | Freq: Every day | ORAL | Status: DC
  Filled 2014-01-19 (×4): qty 1

## 2014-01-19 MED ORDER — POTASSIUM CHLORIDE IN NACL 20-0.9 MEQ/L-% IV SOLN
INTRAVENOUS | Status: DC
  Administered 2014-01-19: 18:00:00 via INTRAVENOUS
  Filled 2014-01-19 (×2): qty 1000

## 2014-01-19 MED ORDER — HYDROMORPHONE HCL 1 MG/ML IJ SOLN: 0.5000 mg | INTRAMUSCULAR | Status: DC | PRN

## 2014-01-19 MED ORDER — FENTANYL CITRATE 0.05 MG/ML IJ SOLN
INTRAMUSCULAR | Status: AC
  Administered 2014-01-19: 100 ug
  Filled 2014-01-19: qty 2

## 2014-01-19 MED ORDER — CHLORHEXIDINE GLUCONATE 4 % EX LIQD
60.0000 mL | Freq: Once | CUTANEOUS | Status: DC
  Filled 2014-01-19: qty 60

## 2014-01-19 MED ORDER — OXYCODONE-ACETAMINOPHEN 5-325 MG PO TABS
1.0000 | ORAL_TABLET | ORAL | Status: DC | PRN
  Administered 2014-01-20 – 2014-01-21 (×7): 2 via ORAL
  Filled 2014-01-19 (×7): qty 2

## 2014-01-19 MED ORDER — BUPIVACAINE-EPINEPHRINE (PF) 0.5% -1:200000 IJ SOLN
INTRAMUSCULAR | Status: DC | PRN
  Administered 2014-01-19: 30 mL via PERINEURAL

## 2014-01-19 MED ORDER — GLYCOPYRROLATE 0.2 MG/ML IJ SOLN
INTRAMUSCULAR | Status: DC | PRN
  Administered 2014-01-19: 0.4 mg via INTRAVENOUS

## 2014-01-19 MED ORDER — MIDAZOLAM HCL 2 MG/2ML IJ SOLN
INTRAMUSCULAR | Status: AC
  Administered 2014-01-19: 2 mg
  Filled 2014-01-19: qty 2

## 2014-01-19 MED ORDER — PANTOPRAZOLE SODIUM 40 MG PO TBEC
40.0000 mg | DELAYED_RELEASE_TABLET | Freq: Every day | ORAL | Status: DC
  Administered 2014-01-20 – 2014-01-21 (×2): 40 mg via ORAL
  Filled 2014-01-19 (×3): qty 1

## 2014-01-19 MED ORDER — HYDROMORPHONE HCL 1 MG/ML IJ SOLN
INTRAMUSCULAR | Status: AC
  Administered 2014-01-19: 1 mg
  Filled 2014-01-19: qty 1

## 2014-01-19 MED ORDER — PROPOFOL 10 MG/ML IV BOLUS
INTRAVENOUS | Status: AC
  Filled 2014-01-19: qty 20

## 2014-01-19 MED ORDER — TAMSULOSIN HCL 0.4 MG PO CAPS
0.4000 mg | ORAL_CAPSULE | Freq: Every day | ORAL | Status: DC
  Administered 2014-01-19 – 2014-01-20 (×2): 0.4 mg via ORAL
  Filled 2014-01-19 (×3): qty 1

## 2014-01-19 MED ORDER — GLYCOPYRROLATE 0.2 MG/ML IJ SOLN: INTRAMUSCULAR | Status: DC | PRN

## 2014-01-19 MED ORDER — PHENYLEPHRINE HCL 10 MG/ML IJ SOLN
INTRAMUSCULAR | Status: DC | PRN
  Administered 2014-01-19 (×2): 80 ug via INTRAVENOUS
  Administered 2014-01-19: 120 ug via INTRAVENOUS
  Administered 2014-01-19 (×3): 80 ug via INTRAVENOUS

## 2014-01-19 MED ORDER — METOCLOPRAMIDE HCL 5 MG PO TABS: 5.0000 mg | ORAL_TABLET | Freq: Three times a day (TID) | ORAL | Status: DC | PRN

## 2014-01-19 SURGICAL SUPPLY — 93 items
BANDAGE ELASTIC 4 VELCRO ST LF (GAUZE/BANDAGES/DRESSINGS) ×3 IMPLANT
BANDAGE ELASTIC 6 VELCRO ST LF (GAUZE/BANDAGES/DRESSINGS) ×1 IMPLANT
BANDAGE ESMARK 6X9 LF (GAUZE/BANDAGES/DRESSINGS) ×1 IMPLANT
BIT DRILL 2.5X110 QC LCP DISP (BIT) ×2 IMPLANT
BIT DRILL 2.8 (BIT) ×1
BIT DRILL CANN QC 2.8X165 (BIT) IMPLANT
BLADE SURG 10 STRL SS (BLADE) ×3 IMPLANT
BLADE SURG 15 STRL LF DISP TIS (BLADE) ×1 IMPLANT
BLADE SURG 15 STRL SS (BLADE) ×3
BLADE SURG ROTATE 9660 (MISCELLANEOUS) IMPLANT
BNDG CMPR 9X6 STRL LF SNTH (GAUZE/BANDAGES/DRESSINGS) ×1
BNDG COHESIVE 4X5 TAN STRL (GAUZE/BANDAGES/DRESSINGS) ×1 IMPLANT
BNDG ESMARK 6X9 LF (GAUZE/BANDAGES/DRESSINGS) ×3
BNDG GAUZE ELAST 4 BULKY (GAUZE/BANDAGES/DRESSINGS) ×3 IMPLANT
BONE CANC CHIPS 40CC CAN1/2 (Bone Implant) ×6 IMPLANT
BRUSH SCRUB DISP (MISCELLANEOUS) ×6 IMPLANT
CHIPS CANC BONE 40CC CAN1/2 (Bone Implant) ×2 IMPLANT
COVER MAYO STAND STRL (DRAPES) ×1 IMPLANT
DRAPE C-ARM 42X72 X-RAY (DRAPES) ×3 IMPLANT
DRAPE C-ARMOR (DRAPES) ×3 IMPLANT
DRAPE INCISE IOBAN 66X45 STRL (DRAPES) ×1 IMPLANT
DRAPE ORTHO SPLIT 77X108 STRL (DRAPES)
DRAPE SURG ORHT 6 SPLT 77X108 (DRAPES) IMPLANT
DRAPE U-SHAPE 47X51 STRL (DRAPES) ×3 IMPLANT
DRILL BIT 2.8MM (BIT) ×3
DRSG ADAPTIC 3X8 NADH LF (GAUZE/BANDAGES/DRESSINGS) ×3 IMPLANT
DRSG PAD ABDOMINAL 8X10 ST (GAUZE/BANDAGES/DRESSINGS) ×6 IMPLANT
ELECT REM PT RETURN 9FT ADLT (ELECTROSURGICAL) ×3
ELECTRODE REM PT RTRN 9FT ADLT (ELECTROSURGICAL) ×1 IMPLANT
EVACUATOR 1/8 PVC DRAIN (DRAIN) IMPLANT
EVACUATOR 3/16  PVC DRAIN (DRAIN)
EVACUATOR 3/16 PVC DRAIN (DRAIN) IMPLANT
GAUZE SPONGE 4X4 12PLY STRL (GAUZE/BANDAGES/DRESSINGS) ×3 IMPLANT
GLOVE BIO SURGEON STRL SZ7.5 (GLOVE) ×3 IMPLANT
GLOVE BIO SURGEON STRL SZ8 (GLOVE) ×5 IMPLANT
GLOVE BIOGEL PI IND STRL 6.5 (GLOVE) IMPLANT
GLOVE BIOGEL PI IND STRL 7.5 (GLOVE) ×1 IMPLANT
GLOVE BIOGEL PI IND STRL 8 (GLOVE) ×1 IMPLANT
GLOVE BIOGEL PI INDICATOR 6.5 (GLOVE) ×8
GLOVE BIOGEL PI INDICATOR 7.5 (GLOVE) ×2
GLOVE BIOGEL PI INDICATOR 8 (GLOVE) ×2
GOWN STRL REUS W/ TWL LRG LVL3 (GOWN DISPOSABLE) ×2 IMPLANT
GOWN STRL REUS W/ TWL XL LVL3 (GOWN DISPOSABLE) ×1 IMPLANT
GOWN STRL REUS W/TWL LRG LVL3 (GOWN DISPOSABLE) ×6
GOWN STRL REUS W/TWL XL LVL3 (GOWN DISPOSABLE) ×6
GRAFT BNE CHIP CANC 1-8 40 (Bone Implant) IMPLANT
IMMOBILIZER KNEE 22 UNIV (SOFTGOODS) ×3 IMPLANT
KIT BASIN OR (CUSTOM PROCEDURE TRAY) ×3 IMPLANT
KIT INFUSE LRG II (Orthopedic Implant) ×2 IMPLANT
KIT INFUSE MEDIUM (Orthopedic Implant) ×2 IMPLANT
KIT ROOM TURNOVER OR (KITS) ×3 IMPLANT
MANIFOLD NEPTUNE II (INSTRUMENTS) ×3 IMPLANT
NDL SUT .5 MAYO 1.404X.05X (NEEDLE) IMPLANT
NEEDLE 22X1 1/2 (OR ONLY) (NEEDLE) IMPLANT
NEEDLE MAYO TAPER (NEEDLE)
NS IRRIG 1000ML POUR BTL (IV SOLUTION) ×3 IMPLANT
PACK ORTHO EXTREMITY (CUSTOM PROCEDURE TRAY) ×3 IMPLANT
PAD ARMBOARD 7.5X6 YLW CONV (MISCELLANEOUS) ×8 IMPLANT
PAD CAST 4YDX4 CTTN HI CHSV (CAST SUPPLIES) ×1 IMPLANT
PADDING CAST COTTON 4X4 STRL (CAST SUPPLIES)
PADDING CAST COTTON 6X4 STRL (CAST SUPPLIES) ×1 IMPLANT
PLATE POST DISTAL 3.5M 12H (Plate) ×2 IMPLANT
SCREW CORTEX 3.5 26MM (Screw) ×2 IMPLANT
SCREW CORTEX 3.5 28MM (Screw) ×2 IMPLANT
SCREW CORTEX 3.5 50MM (Screw) ×2 IMPLANT
SCREW LOCK CORT ST 3.5X26 (Screw) IMPLANT
SCREW LOCK CORT ST 3.5X28 (Screw) IMPLANT
SCREW LOCK T15 FT 32X3.5X2.9X (Screw) IMPLANT
SCREW LOCKING 3.5X26 (Screw) ×2 IMPLANT
SCREW LOCKING 3.5X32 (Screw) ×3 IMPLANT
SCREW LOCKING 3.5X45 (Screw) ×2 IMPLANT
SCREW LOCKING 3.5X50 (Screw) ×6 IMPLANT
SPONGE LAP 18X18 X RAY DECT (DISPOSABLE) ×1 IMPLANT
STAPLER VISISTAT 35W (STAPLE) ×3 IMPLANT
STOCKINETTE IMPERVIOUS LG (DRAPES) ×3 IMPLANT
SUCTION FRAZIER TIP 10 FR DISP (SUCTIONS) ×3 IMPLANT
SUT ETHILON 3 0 PS 1 (SUTURE) ×6 IMPLANT
SUT PROLENE 0 CT 2 (SUTURE) ×2 IMPLANT
SUT VIC AB 0 CT1 27 (SUTURE)
SUT VIC AB 0 CT1 27XBRD ANBCTR (SUTURE) ×1 IMPLANT
SUT VIC AB 1 CT1 27 (SUTURE)
SUT VIC AB 1 CT1 27XBRD ANBCTR (SUTURE) ×1 IMPLANT
SUT VIC AB 2-0 CT1 27 (SUTURE) ×3
SUT VIC AB 2-0 CT1 TAPERPNT 27 (SUTURE) ×2 IMPLANT
SYR 20ML ECCENTRIC (SYRINGE) IMPLANT
TOWEL OR 17X24 6PK STRL BLUE (TOWEL DISPOSABLE) ×3 IMPLANT
TOWEL OR 17X26 10 PK STRL BLUE (TOWEL DISPOSABLE) ×6 IMPLANT
TRAY FOLEY CATH 14FRSI W/METER (CATHETERS) IMPLANT
TRAY FOLEY CATH 16FRSI W/METER (SET/KITS/TRAYS/PACK) ×2 IMPLANT
TUBE CONNECTING 12'X1/4 (SUCTIONS) ×1
TUBE CONNECTING 12X1/4 (SUCTIONS) ×2 IMPLANT
WATER STERILE IRR 1000ML POUR (IV SOLUTION) ×2 IMPLANT
YANKAUER SUCT BULB TIP NO VENT (SUCTIONS) ×3 IMPLANT

## 2014-01-19 NOTE — Brief Op Note (Signed)
01/19/2014  2:59 PM  PATIENT:  Kevin Kim  69 y.o. male  PRE-OPERATIVE DIAGNOSIS:   1. RIGHT TIBIAL PILON FIBULA NONUNION  2. RETAINED EXTERNAL FIXATOR  POST-OPERATIVE DIAGNOSIS:   1. RIGHT TIBIAL PILON FIBULA NONUNION  2. RETAINED EXTERNAL FIXATOR  PROCEDURE:  Procedure(s): 1. OPEN REDUCTION INTERNAL FIXATION (ORIF) TIBIA PILON FRACTURE NONUNION (Right) 2. REMOVAL OF EXTERNAL FIXATOR UNDER ANESTHESIA  SURGEON:  Surgeon(s) and Role:    * Rozanna Box, MD - Primary  PHYSICIAN ASSISTANT: Ainsley Spinner, PA-C  ANESTHESIA:   general with regional block  I/O:  Total I/O In: 1700 [I.V.:1700] Out: 925 [Urine:675; Blood:250]  SPECIMEN:  Bone from nonunion site  DISPOSITION OF SPECIMEN:  To micro  TOURNIQUET:  None  DICTATION: .Other Dictation: Dictation Number [id number]

## 2014-01-19 NOTE — Anesthesia Postprocedure Evaluation (Signed)
Anesthesia Post Note  Patient: Kevin Kim  Procedure(s) Performed: Procedure(s) (LRB): OPEN REDUCTION INTERNAL FIXATION (ORIF) TIBIA/FIBULA FRACTURE REPAIR OF TIBIA/FIBULA NON UNION (Right)  Anesthesia type: General  Patient location: PACU  Post pain: Pain level controlled and Adequate analgesia  Post assessment: Post-op Vital signs reviewed, Patient's Cardiovascular Status Stable, Respiratory Function Stable, Patent Airway and Pain level controlled  Last Vitals:  Filed Vitals:   01/19/14 1451  BP: 123/65  Pulse: 79  Temp: 36.4 C  Resp: 12    Post vital signs: Reviewed and stable  Level of consciousness: awake, alert  and oriented  Complications: No apparent anesthesia complications

## 2014-01-19 NOTE — Anesthesia Procedure Notes (Addendum)
Anesthesia Regional Block:  Popliteal block  Pre-Anesthetic Checklist: ,, timeout performed, Correct Patient, Correct Site, Correct Laterality, Correct Procedure, Correct Position, site marked, Risks and benefits discussed,  Surgical consent,  Pre-op evaluation,  At surgeon's request and post-op pain management  Laterality: Right  Prep: chloraprep       Needles:  Injection technique: Single-shot  Needle Type: Echogenic Stimulator Needle     Needle Length: 9cm 9 cm Needle Gauge: 21 and 21 G    Additional Needles:  Procedures: ultrasound guided (picture in chart) and nerve stimulator Popliteal block  Nerve Stimulator or Paresthesia:  Response: plantar flexion of foot, 0.45 mA,   Additional Responses:   Narrative:  Start time: 01/19/2014 10:12 AM End time: 01/19/2014 10:30 AM Injection made incrementally with aspirations every 5 mL.  Performed by: Personally   Additional Notes: Functioning IV was confirmed and monitors were applied.  A 67mm 21ga Arrow echogenic stimulator needle was used. Sterile prep and drape,hand hygiene and sterile gloves were used.  Negative aspiration and negative test dose prior to incremental administration of local anesthetic. The patient tolerated the procedure well.  Ultrasound guidance: relevent anatomy identified, needle position confirmed, local anesthetic spread visualized around nerve(s), vascular puncture avoided.  Image printed for medical record.    Procedure Name: Intubation Date/Time: 01/19/2014 10:59 AM Performed by: Shirlyn Goltz Pre-anesthesia Checklist: Patient identified, Emergency Drugs available, Suction available and Patient being monitored Patient Re-evaluated:Patient Re-evaluated prior to inductionOxygen Delivery Method: Circle system utilized Preoxygenation: Pre-oxygenation with 100% oxygen Intubation Type: IV induction Ventilation: Oral airway inserted - appropriate to patient size Laryngoscope Size: Mac and 4 Grade View:  Grade I Tube type: Oral Tube size: 7.5 mm Number of attempts: 1 Airway Equipment and Method: Stylet Placement Confirmation: ETT inserted through vocal cords under direct vision,  positive ETCO2 and breath sounds checked- equal and bilateral Secured at: 23 cm Tube secured with: Tape Dental Injury: Teeth and Oropharynx as per pre-operative assessment

## 2014-01-19 NOTE — H&P (Signed)
Orthopaedic Trauma Service H&P  Chief Complaint:  Nonunion R distal tibia  HPI:   69 y/o male s/p accident in October that resulted in an open R distal tibia fracture. Pt had several I&D's to address his fracture along with ex fix and limited internal fixation due to extensive swelling and tenuous soft tissue. Pt was sent to [facility] early on as we were concerned about is open wound. He subsequently had a free flap performed which as done very well and is healed.  Pt presented to the office last week for Removal of ex fix. xrays were obtained which suggested nonunion. CT was obtained which confirmed. 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 GERD (gastroesophageal reflux disease)   . Irregular heartbeat     1980's.  . Pneumonia     last time 1993  . Anxiety     while in hospital  . Depression     Past Surgical History  Procedure Laterality Date  . I&d extremity Right 10/24/2013    Procedure: IRRIGATION AND DEBRIDEMENT OPEN RIGHT TIBIA/FIBULA FRACTURE;  Surgeon: Mauri Pole, MD;  Location: [facility];  Service: Orthopedics;  Laterality: Right;  . External fixation leg Right 10/24/2013    Procedure: EXTERNAL FIXATION RIGHT LOWER LEG;  Surgeon: Mauri Pole, MD;  Location: [facility];  Service: Orthopedics;  Laterality: Right;  . Closed reduction tibia Right 10/24/2013    Procedure: CLOSED REDUCTION TIBIA/FIBULA FRACTURE;  Surgeon: Mauri Pole, MD;  Location: [facility];  Service: Orthopedics;  Laterality: Right;  . External fixation leg Right 10/27/2013    Procedure:   REVISION OF  EXTERNAL FIXATOR RIGHT LOWER LEG   ORIF  RIGHT TIBIAL PILON APPLICATION OF WOUND VAC;  Surgeon: Rozanna Box, MD;  Location: [facility];  Service: Orthopedics;  Laterality: Right;  . Colonoscopy    . Hernia repair Bilateral [id number]    plus umbilicial-     Family History  Problem Relation Age of Onset  . Cancer Mother   . Cancer Father    Social  History:  reports that he has quit smoking. He has never used smokeless tobacco. He reports that he drinks alcohol. He reports that he does not use illicit drugs.  Allergies:  Allergies  Allergen Reactions  . Amoxicillin Other (See Comments)    Gi upset; abdominal pain  . Peanut-Containing Drug Products Hives    All nuts    Medications Prior to Admission  Medication Sig Dispense Refill  . aspirin 81 MG chewable tablet Chew 81 mg by mouth every morning.    . docusate sodium 100 MG CAPS Take 100 mg by mouth 2 (two) times daily. (Patient taking differently: Take 100 mg by mouth daily. ) 10 capsule 0  . lansoprazole (PREVACID) 30 MG capsule Take 30 mg by mouth every morning.     . Multiple Vitamins-Minerals (MULTIVITAMINS THER. W/MINERALS) TABS Take 1 tablet by mouth every morning. Centrum Silver    . oxyCODONE (OXY IR/ROXICODONE) 5 MG immediate release tablet Take 1-2 tablets (5-10 mg total) by mouth every 6 (six) hours as needed for breakthrough pain (take between percocet for breakthrough pain). 70 tablet 0  . pregabalin (LYRICA) 75 MG capsule Take 1 capsule (75 mg total) by mouth 2 (two) times daily. 60 capsule 1  . venlafaxine (EFFEXOR) 37.5 MG tablet Take 1 tablet by mouth daily.    . Vitamin D, Ergocalciferol, (DRISDOL) 50000 UNITS  CAPS capsule Take 50,000 Units by mouth once a week. On Fridays    . ciprofloxacin (CIPRO) 500 MG tablet Take 1 tablet (500 mg total) by mouth 2 (two) times daily. (Patient not taking: Reported on 01/18/2014)    . enoxaparin (LOVENOX) 40 MG/0.4ML injection Inject 0.4 mLs (40 mg total) into the skin daily. (Patient not taking: Reported on 01/18/2014) 18 Syringe 0  . hydrOXYzine (ATARAX/VISTARIL) 50 MG tablet Take 1 tablet (50 mg total) by mouth 3 (three) times daily as needed for itching, anxiety or nausea. (Patient taking differently: Take 50 mg by mouth 3 (three) times daily as needed for anxiety, itching or nausea (denies having prescription). ) 30 tablet 0  .  methocarbamol (ROBAXIN) 500 MG tablet Take 1-2 tablets (500-1,000 mg total) by mouth every 6 (six) hours as needed for muscle spasms. (Patient not taking: Reported on 01/18/2014)    . oxyCODONE-acetaminophen (PERCOCET/ROXICET) 5-325 MG per tablet Take 1-2 tablets by mouth every 6 (six) hours as needed for moderate pain or severe pain. (Patient not taking: Reported on 01/18/2014) 90 tablet 0  . tamsulosin (FLOMAX) 0.4 MG CAPS capsule Take 1 capsule (0.4 mg total) by mouth daily after supper. (Patient not taking: Reported on 01/18/2014) 15 capsule 0    No results found for this or any previous visit (from the past 48 hour(s)). No results found.  Review of Systems  Constitutional: Negative for fever and chills.  Respiratory: Negative for shortness of breath and wheezing.   Cardiovascular: Negative for chest pain and palpitations.  Gastrointestinal: Negative for nausea, vomiting and abdominal pain.  Genitourinary: Negative for dysuria.  Neurological: Negative for tingling and tremors.    Blood pressure 164/73, pulse 76, temperature 98.1 F (36.7 C), temperature source Oral, resp. rate 20, height 5\' 8"  (1.727 m), weight 86.183 kg (190 lb), SpO2 99 %. Physical Exam  Constitutional: He appears well-developed and well-nourished.  HENT:  Head: Normocephalic and atraumatic.  Cardiovascular: Normal rate and regular rhythm.   Respiratory: Effort normal and breath sounds normal.  GI: Soft. Bowel sounds are normal. He exhibits no distension. There is no tenderness.  Musculoskeletal:  Right Lower Extremity    Ex fix stable   Pin sites look good    Ext warm    Free flap looks great along medial lower leg   + DP pulse   Swelling stable    DPN, SPN, TN sensation intact   EHL,FHL, lesser toe motor functions grossly intact      Assessment/Plan  69 y/o male with nonunion R distal tibia s/p open R pilon fracture   OR for repair of nonunion with posterior plate Removal of ex fix  NWB x 6  weeks Admit for pain control post op and therapies    Jari Pigg, PA-C [facility] [phone] (P)  01/19/2014, 7:55 AM

## 2014-01-19 NOTE — Anesthesia Preprocedure Evaluation (Addendum)
Anesthesia Evaluation  Patient identified by MRN, date of birth, ID band Patient awake    Reviewed: Allergy & Precautions, NPO status , Patient's Chart, lab work & pertinent test results  Airway Mallampati: II   Neck ROM: full    Dental   Pulmonary former smoker,          Cardiovascular negative cardio ROS      Neuro/Psych Anxiety Depression    GI/Hepatic GERD-  ,  Endo/Other    Renal/GU      Musculoskeletal   Abdominal   Peds  Hematology   Anesthesia Other Findings   Reproductive/Obstetrics                            Anesthesia Physical Anesthesia Plan  ASA: II  Anesthesia Plan: General and Regional   Post-op Pain Management: MAC Combined w/ Regional for Post-op pain   Induction: Intravenous  Airway Management Planned: Oral ETT  Additional Equipment:   Intra-op Plan:   Post-operative Plan: Extubation in OR  Informed Consent: I have reviewed the patients History and Physical, chart, labs and discussed the procedure including the risks, benefits and alternatives for the proposed anesthesia with the patient or authorized representative who has indicated his/her understanding and acceptance.     Plan Discussed with: CRNA, Anesthesiologist and Surgeon  Anesthesia Plan Comments:        Anesthesia Quick Evaluation

## 2014-01-19 NOTE — Transfer of Care (Signed)
Immediate Anesthesia Transfer of Care Note  Patient: Kevin Kim  Procedure(s) Performed: Procedure(s): OPEN REDUCTION INTERNAL FIXATION (ORIF) TIBIA/FIBULA FRACTURE REPAIR OF TIBIA/FIBULA NON UNION (Right)  Patient Location: PACU  Anesthesia Type:General  Level of Consciousness: awake, alert , oriented and patient cooperative  Airway & Oxygen Therapy: Patient Spontanous Breathing and Patient connected to nasal cannula oxygen  Post-op Assessment: Report given to PACU RN, Post -op Vital signs reviewed and stable and Patient moving all extremities  Post vital signs: Reviewed and stable  Complications: No apparent anesthesia complications

## 2014-01-19 NOTE — Progress Notes (Signed)
Orthopedic Tech Progress Note Patient Details:  Kevin Kim 18-Sep-1945 [id number]  Ortho Devices Type of Ortho Device:  (prafo boot) Ortho Device/Splint Interventions: Loanne Drilling, Kiaria Quinnell 01/19/2014, 2:20 PM

## 2014-01-20 ENCOUNTER — Encounter (HOSPITAL_COMMUNITY): Payer: Self-pay | Admitting: General Practice

## 2014-01-20 LAB — BASIC METABOLIC PANEL
Anion gap: 8 (ref 5–15)
BUN: 7 mg/dL (ref 6–23)
CHLORIDE: 106 meq/L (ref 96–112)
CO2: 26 mmol/L (ref 19–32)
Calcium: 8.6 mg/dL (ref 8.4–10.5)
Creatinine, Ser: 0.79 mg/dL (ref 0.50–1.35)
GFR calc non Af Amer: >90 mL/min (ref >90)
Glucose, Bld: 114 mg/dL — ABNORMAL HIGH (ref 70–99)
POTASSIUM: 4.6 mmol/L (ref 3.5–5.1)
Sodium: 140 mmol/L (ref 135–145)

## 2014-01-20 LAB — VITAMIN D 25 HYDROXY (VIT D DEFICIENCY, FRACTURES): Vit D, 25-Hydroxy: 34.1 ng/mL (ref 30.0–100.0)

## 2014-01-20 MED ORDER — OXYCODONE-ACETAMINOPHEN 5-325 MG PO TABS: 1.0000 | ORAL_TABLET | Freq: Four times a day (QID) | ORAL | Status: DC | PRN

## 2014-01-20 MED ORDER — OXYCODONE HCL 5 MG PO TABS: 5.0000 mg | ORAL_TABLET | Freq: Four times a day (QID) | ORAL | Status: DC | PRN

## 2014-01-20 MED ORDER — METHOCARBAMOL 500 MG PO TABS: 500.0000 mg | ORAL_TABLET | Freq: Four times a day (QID) | ORAL | Status: DC | PRN

## 2014-01-20 MED ORDER — CHOLECALCIFEROL 50 MCG (2000 UT) PO CAPS: 2000.0000 [IU] | ORAL_CAPSULE | Freq: Every day | ORAL | Status: AC

## 2014-01-20 MED ORDER — VITAMIN D (ERGOCALCIFEROL) 1.25 MG (50000 UNIT) PO CAPS
50000.0000 [IU] | ORAL_CAPSULE | ORAL | Status: DC
  Administered 2014-01-20: 50000 [IU] via ORAL
  Filled 2014-01-20: qty 1

## 2014-01-20 NOTE — Op Note (Signed)
NAMEJAVARION, Kevin Kim            ACCOUNT NO.:  [id number]  MEDICAL RECORD NO.:  [id number]  LOCATION:                                FACILITY:  [facility]  PHYSICIAN:  Astrid Divine. Marcelino Scot, M.D. DATE OF BIRTH:  March 11, 1945  DATE OF PROCEDURE:  01/19/2014 DATE OF DISCHARGE:                              OPERATIVE REPORT   PREOPERATIVE DIAGNOSES: 1. Right tibial pilon nonunion, tibia, and fibula. 2. Retained external fixator.  POSTOPERATIVE DIAGNOSES: 1. Right tibial pilon nonunion, tibia, and fibula. 2. Retained external fixator.  PROCEDURES: 1. Open reduction and internal fixation and repair of tibial pilon     fracture nonunion. 2. Removal of external fixator under anesthesia.  SURGEON:  Astrid Divine. Marcelino Scot, M.D.  ASSISTANT:  Jari Pigg, PA-C.  ANESTHESIA:  General supplemented with regional block.  I/O:  1700 in, UOP 675 mL out.  EBL:  250 mL.  SPECIMENS:  Cortical bone sent from deep within the nonunion site.  TOURNIQUET:  None.  DISPOSITION:  To PACU.  CONDITION:  Stable.  BRIEF SUMMARY AND INDICATION FOR PROCEDURE:  Kevin Kim is a very pleasant 69 year old male, who sustained an  open tib-fib fracture back in October and treated with spanning external fixation and limited internal fixation in addition to the soft tissue management. Ultimately, he required a free flap for sufficient covers and it was performed by [facility] at [facility] using a left radial forearm flap.  The patient had outstanding incorporation but did not develop union of the fracture as demonstrated by recent CT scan.  I  discussed with him the risks and benefits of surgical repair including the possibility of persistent nonunion, infection, nerve injury, vessel injury, DVT, PE, loss of motion, arthritis, and need for subsequent procedures including repeat attempts at repair were ankle effusion and other reconstructive options.  The patient acknowledges risks and did wish to proceed with  repair.  BRIEF SUMMARY OF PROCEDURE:  Kevin Kim was taken out room and general anesthesia was induced.  He did receive vancomycin preoperatively.  The patient was positioned prone and the external fixator was removed.  The pin sites looked outstanding and no formal curettage was required.  We did use aggressive chlorhexidine scrub and lavaged.  Standard Betadine prep and drape was then performed.  Kevin Kim nonunion site was approached through an extended posterolateral incision, carried dissection carefully down, where the sural nerve and vessel were identified and retracted posteriorly.  The superficial fascia was incised revealing the gastrocsoleus complex. This was  retracted medially exposing the deeper fascial layer.  This was incised under direct visualization in the muscle and soft tissue, beneath were well vascularized and contractile.  I then carefully exposed the posterior tibial surface and used a 10 blade to enter the fracture site and then a series of curettes to remove all the fibrinous debris within.  In some of this debris inside the canal, I also encountered a piece of cortical bone, which I did send as a specimen to micro for culture.  The fibula was probed while keeping intact the periosteum and was found to have considerable stability.  As such, a separate approach exposed and instrumentation was not performed.  My assistant held throughout with retraction of the deep  posterior compartment to allow for exposure and repair.  I irrigated the area thoroughly before placing the 4 sponges of the medium infuse packet, placing 1 of those along the subchondral surface and applying cancellous chips directly over top, tamping it down into the subchondral region of the metaphysis and this was followed by additional graft and then placement of infuse sponges along the anterior, medial, and lateral aspects of the metaphysis.  Additional graft was placed using  cancellous chips such that a total of 70 mL was impacted.  The area of nonunion measured over 65 mm on the CT scan, and I then used the large INFUSE sponge, cutting it vertically and placing one strip within the posterior aspect of the fracture  site.  Additional graft was placed over this bringing a flush with the level of the posterior cortex and then the final INFUSE sponge was laid over the entirety of the metaphysis and shaft.  Additional bone graft was placed directly over this and then the posterior Pilon plate from [employer],  first using standard fixation distally and proximally to maximally appose the plate to the bone and then returning with locked fixation such that I had 4 Rafter screws along the subchondral region and 1 intracortical metadiaphyseal screw and then 3 bicortical screws proximally, two of which were unlocked and placed first and one locked.  Wound was irrigated and closed in a standard layered fashion using 2-0 Vicryl and 3-0 nylon.  Sterile gently compressive dressing was applied and then [facility] boot, so the patient may begin immediate and early range of motion having been 10 weeks in the external fixator.  Kevin Spinner, PA-C, assisted me throughout and assistance was absolutely necessary given difficulty of the posterior approach for this nonunion repair.  PROGNOSIS:  Kevin Kim will be nonweightbearing for the next 6 weeks with graduated weightbearing thereafter.  We anticipate transitioning him into aquatic therapy as soon as his wound heals, which I would anticipate being in 2 weeks.  He will begin immediate active and passive range of motion of the ankle.  He will be on DVT prophylaxis with Lovenox, and we will follow up in 2 weeks after discharge for suture removal and re-evaluation with x-rays.  We anticipate a 2-day stay in the hospital depending upon his level of pain control and mobility.     Astrid Divine. Marcelino Scot, M.D.     MHH/MEDQ  D:  01/19/2014   T:  01/20/2014  Job:  [id number]

## 2014-01-20 NOTE — Progress Notes (Signed)
Orthopaedic Trauma Service Progress Note  Subjective  Doing ok Block starting to wear off No complaints this am   Review of Systems  Constitutional: Negative for fever and chills.  Respiratory: Negative for shortness of breath and wheezing.   Cardiovascular: Negative for chest pain and palpitations.  Gastrointestinal: Negative for nausea, vomiting and abdominal pain.     Objective   BP 98/54 mmHg  Pulse 79  Temp(Src) 98.8 F (37.1 C) (Oral)  Resp 18  Ht 5\' 8"  (1.727 m)  Wt 86.183 kg (190 lb)  BMI 28.90 kg/m2  SpO2 95%  Intake/Output      01/19 0701 - 01/20 0700 01/20 0701 - 01/21 0700   P.O. 720    I.V. (mL/kg) 2292.5 (26.6)    IV Piggyback 200    Total Intake(mL/kg) 3212.5 (37.3)    Urine (mL/kg/hr) 3925 (1.9)    Blood 250 (0.1)    Total Output 4175     Net -962.5            Labs  Results for XZAYVION, VAETH (MRN [id number]) as of 01/20/2014 08:26  Ref. Range 01/19/2014 07:44  Vit D, 25-Hydroxy Latest Range: 30.0-100.0 ng/mL 34.1   Results for BEREN, YNIGUEZ (MRN [id number]) as of 01/20/2014 08:26  Ref. Range 01/20/2014 04:44  Sodium Latest Range: 135-145 mmol/L 140  Potassium Latest Range: 3.5-5.1 mmol/L 4.6  Chloride Latest Range: 96-112 mEq/L 106  CO2 Latest Range: 19-32 mmol/L 26  BUN Latest Range: 6-23 mg/dL 7  Creatinine Latest Range: 0.50-1.35 mg/dL 0.79  Calcium Latest Range: 8.4-10.5 mg/dL 8.6  GFR calc non Af Amer Latest Range: >90 mL/min >90  GFR calc Af Amer Latest Range: >90 mL/min >90  Glucose Latest Range: 70-99 mg/dL 114 (H)  Anion gap Latest Range: 5-15  8   Exam  Gen: awake and alert, NAD Lungs: clear  Cardiac: RRR, s1 and s2 Abd: + BS, NTND Ext:       Right Lower Extremity   Dressing c/d/i  Swelling stable   Block still functional   EHL, FHL, lesser toe motor function intact  Sensation still altered from block   Ext warm  + DP pulse  Ankle motion restricted    Assessment and Plan   POD/HD#: 1   69 y/o male  s/p repair of R distal tibia nonunion   1. R distal tibia nonunion s/p ORIF and grafting  NWB  Unrestricted ROM R ankle and foot  Ice and elevate  Dressing change tomorrow   PT/OT consults   2. GERD  protonix  3. Anxiety and depression   Home meds   4. Pain management:  Percocet  Oxy IR  Dilaudid IV for severe breakthrough pain only   5. ABL anemia/Hemodynamics  Stable    6. DVT/PE prophylaxis:  lovenox while inpatient   7. ID:   Completed periop abx   8. Metabolic Bone Disease:  Vitamin D low normal   Continue supplementation   9. Activity:  As tolerated  10. FEN/Foley/Lines:  Advance diet  Dc foley   11. Dispo:  Therapies  Dc home tomorrow    Jari Pigg, PA-C [facility] [phone] (O) 01/20/2014 8:25 AM

## 2014-01-20 NOTE — Care Management Note (Signed)
  Page 1 of 1   01/21/2014     9:32:53 AM CARE MANAGEMENT NOTE 01/21/2014  Patient:  Kevin Kim, Kevin Kim   Account Number:  [id number]  Date Initiated:  01/20/2014  Documentation initiated by:  Magdalen Spatz  Subjective/Objective Assessment:     Action/Plan:   Anticipated DC Date:  01/21/2014   Anticipated DC Plan:  [place]         Choice offered to / List presented to:  C-1 Patient        [place] arranged  [place]   Status of service:  Completed, signed off Medicare Important Message given?  YES (If response is "NO", the following Medicare IM given date fields will be blank) Date Medicare IM given:  01/20/2014 Medicare IM given by:  Magdalen Spatz Date Additional Medicare IM given:  01/21/2014 Additional Medicare IM given by:  Magdalen Spatz  Discharge Disposition:  [place]  Per UR Regulation:  Reviewed for med. necessity/level of care/duration of stay  If discussed at [place] of Stay Meetings, dates discussed:    Comments:  01-20-14 Patient already has crutches , wheel chair , sliding board and walker at home. Magdalen Spatz RN BSN [phone]

## 2014-01-20 NOTE — Evaluation (Signed)
Occupational Therapy Evaluation Patient Details Name: Kevin Kim MRN: [id number] DOB: 1945/09/15 Today's Date: 01/20/2014    History of Present Illness s/p OR for repair of nonunion with posterior plate, removal of external fixator   Clinical Impression   Patient required assistance PTA secondary to external fixator and NWB status. Patient was using w/c and RW for functional mobility. Patient currently able to perform transfers and engage in functional mobility with min assist using RW and requires up to max assist for LB ADLs secondary to NWB status and pain. Patient will benefit from acute OT to increase overall independence in the areas of ADLs, functional mobility, NWB status>RLE in order to safely discharge home.     Follow Up Recommendations  Home health OT;Supervision/Assistance - 24 hour    Equipment Recommendations  None recommended by OT    Recommendations for Other Services  None at this time   Precautions / Restrictions Precautions Precautions: Fall Restrictions Weight Bearing Restrictions: Yes RLE Weight Bearing: Non weight bearing      Mobility Bed Mobility Overal bed mobility: Needs Assistance Bed Mobility: Rolling;Supine to Sit Rolling: Supervision   Supine to sit: Min guard     General bed mobility comments: patient requries cues for safety and technique  Transfers Overall transfer level: Needs assistance Equipment used: Rolling walker (2 wheeled) Transfers: Sit to/from Stand Sit to Stand: Min assist         General transfer comment: patient requries cues for safety and technique, patient adhered to NWB status independently during sit<>stand    Balance Overall balance assessment: Needs assistance Sitting-balance support: No upper extremity supported Sitting balance-Leahy Scale: Good     Standing balance support: Bilateral upper extremity supported Standing balance-Leahy Scale: Fair     ADL Overall ADL's : Needs  assistance/impaired Eating/Feeding: Independent   Grooming: Sitting;Set up   Upper Body Bathing: Set up;Sitting   Lower Body Bathing: Moderate assistance;Sit to/from stand;Cueing for safety   Upper Body Dressing : Set up;Sitting   Lower Body Dressing: Maximal assistance;Sit to/from stand;Cueing for safety   Toilet Transfer: Minimal assistance;RW;Grab bars;BSC           Functional mobility during ADLs: Minimal assistance;Cueing for safety;Rolling walker General ADL Comments: Patient aware of AE available (reacher, sock aid, LH sponge, LH shoe horn). Recommended patient purchase hip kit in order to increase independence for LB ADLs. Educated patient on safe and effective sit<>stand with correct hand placement.                Pertinent Vitals/Pain Pain Assessment: 0-10 Pain Score: 1  Pain Location: RLE Pain Descriptors / Indicators: Aching Pain Intervention(s): Monitored during session;Repositioned     Hand Dominance Right   Extremity/Trunk Assessment Upper Extremity Assessment Upper Extremity Assessment: Overall WFL for tasks assessed   Lower Extremity Assessment Lower Extremity Assessment: Defer to PT evaluation   Cervical / Trunk Assessment Cervical / Trunk Assessment: Normal      Cognition Arousal/Alertness: Awake/alert Behavior During Therapy: WFL for tasks assessed/performed Overall Cognitive Status: Within Functional Limits for tasks assessed             Home Living Family/patient expects to be discharged to:: Private residence Living Arrangements: Alone                 Bathroom Shower/Tub: Tub/shower unit;Curtain   Bathroom Toilet: Standard     Home Equipment: Bedside commode   Additional Comments: Patient stated he uses 3-in-1 for tub/shower transfers  OT Diagnosis: Generalized weakness;Acute pain   OT Problem List: Decreased strength;Decreased range of motion;Decreased activity tolerance;Impaired balance (sitting and/or  standing);Decreased coordination;Decreased safety awareness;Decreased knowledge of use of DME or AE;Decreased knowledge of precautions;Pain   OT Treatment/Interventions: Self-care/ADL training;Therapeutic exercise;Energy conservation;DME and/or AE instruction;Therapeutic activities;Patient/family education;Balance training    OT Goals(Current goals can be found in the care plan section) Acute Rehab OT Goals Patient Stated Goal: go home tomorrow OT Goal Formulation: With patient Time For Goal Achievement: 02/03/14 Potential to Achieve Goals: Good ADL Goals Pt Will Perform Lower Body Bathing: with supervision;sit to/from stand;with adaptive equipment Pt Will Perform Lower Body Dressing: with supervision;with adaptive equipment;sit to/from stand Pt Will Transfer to Toilet: with supervision;ambulating Pt Will Perform Tub/Shower Transfer: with supervision;3 in 1;ambulating Additional ADL Goal #1: Patient will independently adhere to NWB status > RLE 100% of the time during ADL and functional mobility/transfers  OT Frequency: Min 2X/week   Barriers to D/C: None known at this time. Patient reports that his children can provide 24/7 supervision/assist          End of Session Equipment Utilized During Treatment: Gait belt;Rolling walker;Other (comment) (Prafo boot > RLE)  Activity Tolerance: Patient tolerated treatment well Patient left: in chair;with call bell/phone within reach;Other (comment) (with PT present)   Time: 0300-9233 OT Time Calculation (min): 19 min Charges:  OT General Charges $OT Visit: 1 Procedure OT Evaluation $Initial OT Evaluation Tier I: 1 Procedure OT Treatments $Self Care/Home Management : 8-22 mins  Marquis Diles , MS, OTR/L, CLT Pager: [phone]  01/20/2014, 11:26 AM

## 2014-01-20 NOTE — Evaluation (Signed)
Physical Therapy Evaluation Patient Details Name: Kevin Kim MRN: [id number] DOB: 02/27/1945 Today's Date: 01/20/2014   History of Present Illness  s/p OR for repair of nonunion with posterior plate, removal of external fixator  Clinical Impression  Patient is s/p above surgery resulting in functional limitations due to the deficits listed below (see PT Problem List). Pt very familiar with safe use of RW and wheelchair due to NWB status x 12 weeks PTA. He does not yet ambulate alone with RW (only when HHPT is present). Patient will benefit from continued HHPT to increase their independence, safety with mobility, and Rt ankle ROM/strengthening. No further acute PT needs. Pt d/c'd from acute PT. He hopes to d/c home early 1/21 prior to expected snow fall.      Follow Up Recommendations Home health PT;Supervision for mobility/OOB    Equipment Recommendations  None recommended by PT    Recommendations for Other Services       Precautions / Restrictions Precautions Precautions: Fall Restrictions Weight Bearing Restrictions: Yes RLE Weight Bearing: Non weight bearing      Mobility  Bed Mobility Overal bed mobility: Needs Assistance Bed Mobility: Rolling;Supine to Sit Rolling: Supervision   Supine to sit: Min guard     General bed mobility comments: patient requries cues for safety and technique  Transfers Overall transfer level: Needs assistance Equipment used: Rolling walker (2 wheeled) Transfers: Sit to/from Stand Sit to Stand: Min assist         General transfer comment: patient requries cues for safety and technique, patient adhered to NWB status independently during sit<>stand  Ambulation/Gait Ambulation/Gait assistance: Min guard;Supervision Ambulation Distance (Feet): 4 Feet Assistive device: Rolling walker (2 wheeled) Gait Pattern/deviations: Step-to pattern        Stairs            Wheelchair Mobility    Modified Rankin (Stroke Patients  Only)       Balance Overall balance assessment: Needs assistance Sitting-balance support: No upper extremity supported Sitting balance-Leahy Scale: Good     Standing balance support: Bilateral upper extremity supported Standing balance-Leahy Scale: Fair                               Pertinent Vitals/Pain Pain Assessment: 0-10 Pain Score: 1  Pain Location: RLE Pain Descriptors / Indicators: Aching Pain Intervention(s): Monitored during session;Repositioned    Home Living Family/patient expects to be discharged to:: Private residence Living Arrangements: Alone Available Help at Discharge: Family;Available 24 hours/day (children alternate coming to stay with him) Type of Home: House Home Access: Ramped entrance;Stairs to enter Entrance Stairs-Rails: None Entrance Stairs-Number of Steps: 3 Home Layout: One level Home Equipment: Bedside commode Additional Comments: Patient stated he uses 3-in-1 for tub/shower transfers    Prior Function Level of Independence: Independent with assistive device(s)         Comments: uses 3n1 in tub/shower; uses wheelchair mostly (goes into one bathroom) had HHPT; was walking with PT the length of the house     Hand Dominance   Dominant Hand: Right    Extremity/Trunk Assessment   Upper Extremity Assessment: Overall WFL for tasks assessed           Lower Extremity Assessment: Defer to PT evaluation RLE Deficits / Details: able to hold in NWB; PRAFO present and donned (upper portion opened to apply ice at end of session); minimal toe wiggling present (block still in effect)    Cervical /  Trunk Assessment: Normal  Communication   Communication: No difficulties  Cognition Arousal/Alertness: Awake/alert Behavior During Therapy: WFL for tasks assessed/performed Overall Cognitive Status: Within Functional Limits for tasks assessed                      General Comments General comments (skin integrity, edema,  etc.): Educated on proper positioning for edema management and use of ice. Pt reports he puts foot up on foot rest at home--advised supine with foot higher than heart is ideal the first days    Exercises        Assessment/Plan    PT Assessment All further PT needs can be met in the next venue of care  PT Diagnosis Difficulty walking;Acute pain   PT Problem List Decreased mobility;Decreased range of motion;Decreased strength;Pain (Per ortho notes, no ROM restrictions to ankle; HEP indicated)  PT Treatment Interventions     PT Goals (Current goals can be found in the Care Plan section) Acute Rehab PT Goals Patient Stated Goal: go home tomorrow PT Goal Formulation: All assessment and education complete, DC therapy    Frequency     Barriers to discharge        Co-evaluation PT/OT/SLP Co-Evaluation/Treatment: Yes (OT in room completing session when PT arrived) Reason for Co-Treatment: Other (comment) (some overlap due to PT's schedule; dovetailed) PT goals addressed during session: Mobility/safety with mobility;Proper use of DME         End of Session Equipment Utilized During Treatment: Gait belt Activity Tolerance: Patient tolerated treatment well Patient left: in chair;with call bell/phone within reach Nurse Communication: Mobility status         Time: 4299-8069 PT Time Calculation (min) (ACUTE ONLY): 20 min   Charges:   PT Evaluation $Initial PT Evaluation Tier I: 1 Procedure     PT G Codes:        Paxton Kanaan 2014/01/28, 11:42 AM Pager [phone]

## 2014-01-20 NOTE — Progress Notes (Signed)
Orthopedic Tech Progress Note Patient Details:  Kevin Kim [birth year] [id number]  Patient ID: [name], male   DOB: 08/17/1945, 69 y.o.   MRN: [id number] Pt said that he does not need the trapeze bar patient helper for bed mobility; RN notified  Hildred Priest 01/20/2014, 3:16 PM

## 2014-01-21 DIAGNOSIS — F32A Depression, unspecified: Secondary | ICD-10-CM | POA: Diagnosis present

## 2014-01-21 DIAGNOSIS — F329 Major depressive disorder, single episode, unspecified: Secondary | ICD-10-CM | POA: Diagnosis present

## 2014-01-21 DIAGNOSIS — F419 Anxiety disorder, unspecified: Secondary | ICD-10-CM | POA: Diagnosis present

## 2014-01-21 DIAGNOSIS — S82391M Other fracture of lower end of right tibia, subsequent encounter for open fracture type I or II with nonunion: Secondary | ICD-10-CM | POA: Diagnosis present

## 2014-01-21 LAB — CBC
HCT: 35.7 % — ABNORMAL LOW (ref 39.0–52.0)
Hemoglobin: 11.6 g/dL — ABNORMAL LOW (ref 13.0–17.0)
MCH: 29.2 pg (ref 26.0–34.0)
MCHC: 32.5 g/dL (ref 30.0–36.0)
MCV: 89.9 fL (ref 78.0–100.0)
PLATELETS: 179 10*3/uL (ref 150–400)
RBC: 3.97 MIL/uL — ABNORMAL LOW (ref 4.22–5.81)
RDW: 13.8 % (ref 11.5–15.5)
WBC: 11.5 10*3/uL — AB (ref 4.0–10.5)

## 2014-01-21 LAB — TESTOSTERONE, FREE: Testosterone, Free: 7.4 pg/mL — ABNORMAL LOW (ref 47.0–244.0)

## 2014-01-21 LAB — SEX HORMONE BINDING GLOBULIN: SEX HORMONE BINDING: 26 nmol/L (ref 22–77)

## 2014-01-21 LAB — TESTOSTERONE, % FREE: Testosterone-% Free: 2 % — ABNORMAL HIGH (ref 1.6–2.9)

## 2014-01-21 LAB — TESTOSTERONE: Testosterone: 36 ng/dL — ABNORMAL LOW (ref 300–890)

## 2014-01-21 NOTE — Progress Notes (Signed)
Discharge home. Home discharge instruction given, no questions verbalized. 

## 2014-01-21 NOTE — Progress Notes (Signed)
Orthopedic Tech Progress Note Patient Details:  Kevin Kim [birth year] [id number] CAM walker applied to RLE, application tolerated well.  Ortho Devices Type of Ortho Device: CAM walker Ortho Device/Splint Location: RLE Ortho Device/Splint Interventions: Application   Asia R Thompson 01/21/2014, 9:58 AM

## 2014-01-21 NOTE — Progress Notes (Signed)
[facility] Progress Note  Subjective  Doing well Ready to go home   Review of Systems  Constitutional: Negative for fever and chills.  Respiratory: Negative for shortness of breath and wheezing.   Cardiovascular: Negative for chest pain and palpitations.  Gastrointestinal: Negative for nausea, vomiting and abdominal pain.  Genitourinary: Negative for dysuria and urgency.     Objective   BP 102/62 mmHg  Pulse 89  Temp(Src) 98.2 F (36.8 C) (Oral)  Resp 17  Ht 5\' 8"  (1.727 m)  Wt 86.183 kg (190 lb)  BMI 28.90 kg/m2  SpO2 95%  Intake/Output      01/20 0701 - 01/21 0700 01/21 0701 - 01/22 0700   P.O. 960    I.V. (mL/kg)     IV Piggyback     Total Intake(mL/kg) 960 (11.1)    Urine (mL/kg/hr) 1125 (0.5)    Blood     Total Output 1125     Net -165            Labs  Results for ASHVIK, GRUNDMAN (MRN [id number]) as of 01/21/2014 08:51  Ref. Range 01/21/2014 07:15  WBC Latest Range: 4.0-10.5 K/uL 11.5 (H)  RBC Latest Range: 4.22-5.81 MIL/uL 3.97 (L)  Hemoglobin Latest Range: 13.0-17.0 g/dL 11.6 (L)  HCT Latest Range: 39.0-52.0 % 35.7 (L)  MCV Latest Range: 78.0-100.0 fL 89.9  MCH Latest Range: 26.0-34.0 pg 29.2  MCHC Latest Range: 30.0-36.0 g/dL 32.5  RDW Latest Range: 11.5-15.5 % 13.8  Platelets Latest Range: 150-400 K/uL 179    Exam  Gen: awake and alert, NAD, comfortable   Lungs: unlabored, clear Cardiac: RRR Abd: NTND, + BS  Ext:       Right Lower Extremity   Right Lower Extremity               Dressing c/d/i             Swelling stable               DPN, SPN, TN sensation intact              EHL, FHL, lesser toe motor function intact             Ext warm             + DP pulse             Ankle motion restricted    Assessment and Plan   POD/HD#: 2   69 y/o male s/p repair of R distal tibia nonunion   1. 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 GERD             protonix  3. Anxiety and depression                   Home meds   4. Pain management:             Percocet             Oxy IR  5. ABL anemia/Hemodynamics  Stable   6. DVT/PE prophylaxis:             lovenox while inpatient   No pharmacologics at dc   7. ID:               Completed periop abx   8. Metabolic Bone Disease:             Vitamin D low normal              Continue supplementation   9. Activity:             As tolerated  10. FEN/Foley/Lines:           diet as tolerated    11. Dispo:           dc home today  Follow up in 10-14 days           Jari Pigg, PA-C [facility] [phone] (O) 01/21/2014 8:50 AM

## 2014-01-21 NOTE — Discharge Summary (Signed)
Orthopaedic Trauma Service (OTS)  Patient ID: Kevin Kim MRN: [id number] DOB/AGE: 1945/09/23 69 y.o.  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 Marcelino Scot 1. Open reduction and internal fixation and repair of tibial pilon     fracture nonunion. 2. Removal of external fixator under anesthesia   Discharged Condition: good  Hospital Course:  Patient is a 69 year old male well-known to the orthopedic trauma service after sustaining an open right distal tibia fracture back in October. He was treated with external fixation and limited internal fixation. He was referred to early to plastic surgery where he had a free flap performed at the area of his traumatic wound. Patient has developed a nonunion of his right distal tibia. He was taken to the OR on 01/19/2014 for removal of his ex-fix and fixation of his nonunion. Patient tolerated the procedure well. Surgery was transferred to the PACU for recovery from anesthesia and then transferred to the medical surgical floor for observation, pain control and therapies. Patient's hospital stay was uncomplicated. On postoperative day #1 he was doing well still requiring IV pain medication for pain control. He continued to progress over the next day. He worked well with physical therapy. Able to tolerate regular diet. Foley was discontinued on postoperative day #1 and patient was able to void without difficulty. On postoperative day #2 patient doing very well and felt that he was able to discharge to home. Patient did not have any issues or concerns prior to discharge. Patient discharged in stable condition  on postoperative day #2.  Consults: None  Significant Diagnostic Studies: labs:   Results for Kevin, Kim (MRN [id number]) as of 01/21/2014 09:59  Ref. Range 01/19/2014 07:44  Vit D, 25-Hydroxy Latest Range: 30.0-100.0 ng/mL 34.1  Results for Kevin, Kim (MRN [id number]) as of 01/21/2014 09:59  Ref. Range 01/21/2014 07:15  WBC Latest Range: 4.0-10.5 K/uL 11.5 (H)  RBC Latest Range: 4.22-5.81 MIL/uL 3.97 (L)  Hemoglobin Latest Range: 13.0-17.0 g/dL 11.6 (L)  HCT Latest Range: 39.0-52.0 % 35.7 (L)  MCV Latest Range: 78.0-100.0 fL 89.9  MCH Latest Range: 26.0-34.0 pg 29.2  MCHC Latest Range: 30.0-36.0 g/dL 32.5  RDW Latest Range: 11.5-15.5 % 13.8  Platelets Latest Range: 150-400 K/uL 179    Treatments: IV hydration, antibiotics: vancomycin, analgesia: Dilaudid, OxyIR and Percocet, anticoagulation: LMW heparin, therapies: PT, OT and RN and surgery: As above  Discharge Exam:      Orthopaedic Trauma Service Progress Note  Subjective  Doing well Ready to go home   Review of Systems  Constitutional: Negative for fever and chills.  Respiratory: Negative for shortness of breath and wheezing.   Cardiovascular: Negative for chest pain and palpitations.  Gastrointestinal: Negative for nausea, vomiting and abdominal pain.  Genitourinary: Negative for dysuria and urgency.     Objective   BP 102/62 mmHg  Pulse 89  Temp(Src) 98.2 F (36.8 C) (Oral)  Resp 17  Ht 5\' 8"  (1.727 m)  Wt 86.183 kg (190 lb)  BMI 28.90 kg/m2  SpO2 95%  Intake/Output       01/20 0701 - 01/21 0700 01/21 0701 - 01/22 0700    P.O. 960  I.V. (mL/kg)      IV Piggyback      Total Intake(mL/kg) 960 (11.1)     Urine (mL/kg/hr) 1125 (0.5)     Blood      Total Output 1125      Net -165              Labs  Results for Kevin, Kim (MRN [id number]) as of 01/21/2014 08:51   Ref. Range  01/21/2014 07:15   WBC  Latest Range: 4.0-10.5 K/uL  11.5 (H)   RBC  Latest Range: 4.22-5.81 MIL/uL   3.97 (L)   Hemoglobin  Latest Range: 13.0-17.0 g/dL  11.6 (L)   HCT  Latest Range: 39.0-52.0 %  35.7 (L)   MCV  Latest Range: 78.0-100.0 fL  89.9   MCH  Latest Range: 26.0-34.0 pg  29.2   MCHC  Latest Range: 30.0-36.0 g/dL  32.5   RDW  Latest Range: 11.5-15.5 %  13.8   Platelets  Latest Range: 150-400 K/uL  179     Exam  Gen: awake and alert, NAD, comfortable    Lungs: unlabored, clear Cardiac: RRR Abd: NTND, + BS   Ext:        Right Lower Extremity               Right Lower Extremity               Dressing c/d/i             Swelling stable               DPN, SPN, TN sensation intact               EHL, FHL, lesser toe motor function intact             Ext warm             + DP pulse             Ankle motion restricted    Assessment and Plan   POD/HD#: 2   69 y/o male s/p repair of R distal tibia nonunion   1. R distal tibia nonunion s/p ORIF and grafting             NWB             Unrestricted ROM R ankle and foot             Ice and elevate             Dressing change Saturday               PT/OT consults   2. GERD             protonix  3. Anxiety and depression                   Home meds   4. Pain management:             Percocet             Oxy IR  5. ABL anemia/Hemodynamics             Stable   6. DVT/PE prophylaxis:             lovenox while inpatient               No pharmacologics at dc   7. ID:               Completed  periop abx   8. Metabolic Bone Disease:             Vitamin D low normal              Continue supplementation   9. Activity:             As tolerated  10. FEN/Foley/Lines:           diet as tolerated    11. Dispo:           dc home today             Follow up in 10-14 days            Jari Pigg, PA-C [facility] [phone] (O) 01/21/2014 8:50 AM   Disposition: 06-Home-Health Care Svc      Discharge Instructions    Active range of motion    Complete by:  As directed    Right ankle ROM as tolerated     Call MD / Call 911    Complete by:  As directed   If you experience chest pain or shortness of breath, CALL 911 and be transported to the hospital emergency room.  If you develope a fever above 101 F, pus (white drainage) or increased drainage or redness at the wound, or calf pain, call your surgeon's office.     Constipation Prevention    Complete by:  As directed   Drink plenty of fluids.  Prune juice may be helpful.  You may use a stool softener, such as Colace (over the counter) 100 mg twice a day.  Use MiraLax (over the counter) for constipation as needed.     Diet general    Complete by:  As directed      Discharge instructions    Complete by:  As directed   Orthopaedic Trauma Service Discharge Instructions   General Discharge Instructions  WEIGHT BEARING STATUS: Nonweightbearing Right Leg   RANGE OF MOTION/ACTIVITY: Range of motion R ankle as tolerated. Ok to come out of boot to work on ankle range of motion   Wound Care: daily dry dressing changes to start on 01/23/2014. Do not apply ointments, lotions, solutions (such as iodine, hydrogen peroxide) to wound  Diet: as you were eating previously.  Can use over the counter stool softeners and bowel preparations, such as Miralax, to help with bowel movements.  Narcotics can be constipating.  Be sure to drink plenty of fluids  STOP SMOKING OR USING NICOTINE PRODUCTS!!!!  As discussed nicotine severely impairs your body's ability to heal surgical and traumatic wounds but also impairs bone healing.  Wounds and bone heal by forming microscopic blood vessels (angiogenesis) and nicotine is a vasoconstrictor (essentially, shrinks blood vessels).  Therefore, if vasoconstriction occurs to these microscopic blood vessels they essentially disappear and are unable to deliver necessary nutrients to the healing tissue.  This is one modifiable factor that you can do to dramatically increase your chances of healing your  injury.    (This means no smoking, no nicotine gum, patches, etc)  DO NOT USE NONSTEROIDAL ANTI-INFLAMMATORY DRUGS (NSAID'S)  Using products such as Advil (ibuprofen), Aleve (naproxen), Motrin (ibuprofen) for additional pain control during fracture healing can delay and/or prevent the healing response.  If you would like to take over the counter (OTC) medication, Tylenol (acetaminophen) is ok.  However, some narcotic medications that are given for pain control contain acetaminophen as well. Therefore, you should not exceed more than 4000  mg of tylenol in a day if you do not have liver disease.  Also note that there are may OTC medicines, such as cold medicines and allergy medicines that my contain tylenol as well.  If you have any questions about medications and/or interactions please ask your doctor/PA or your pharmacist.   PAIN MEDICATION USE AND EXPECTATIONS  You have likely been given narcotic medications to help control your pain.  After a traumatic event that results in an fracture (broken bone) with or without surgery, it is ok to use narcotic pain medications to help control one's pain.  We understand that everyone responds to pain differently and each individual patient will be evaluated on a regular basis for the continued need for narcotic medications. Ideally, narcotic medication use should last no more than 6-8 weeks (coinciding with fracture healing).   As a patient it is your responsibility as well to monitor narcotic medication use and report the amount and frequency you use these medications when you come to your office visit.   We would also advise that if you are using narcotic medications, you should take a dose prior to therapy to maximize you participation.  IF YOU ARE ON NARCOTIC MEDICATIONS IT IS NOT PERMISSIBLE TO OPERATE A MOTOR VEHICLE (MOTORCYCLE/CAR/TRUCK/MOPED) OR HEAVY MACHINERY DO NOT MIX NARCOTICS WITH OTHER CNS (CENTRAL NERVOUS SYSTEM) DEPRESSANTS SUCH AS ALCOHOL        ICE AND ELEVATE INJURED/OPERATIVE EXTREMITY  Using ice and elevating the injured extremity above your heart can help with swelling and pain control.  Icing in a pulsatile fashion, such as 20 minutes on and 20 minutes off, can be followed.    Do not place ice directly on skin. Make sure there is a barrier between to skin and the ice pack.    Using frozen items such as frozen peas works well as the conform nicely to the are that needs to be iced.  USE AN ACE WRAP OR TED HOSE FOR SWELLING CONTROL  In addition to icing and elevation, Ace wraps or TED hose are used to help limit and resolve swelling.  It is recommended to use Ace wraps or TED hose until you are informed to stop.    When using Ace Wraps start the wrapping distally (farthest away from the body) and wrap proximally (closer to the body)   Example: If you had surgery on your leg or thing and you do not have a splint on, start the ace wrap at the toes and work your way up to the thigh        If you had surgery on your upper extremity and do not have a splint on, start the ace wrap at your fingers and work your way up to the upper arm  IF YOU ARE IN A SPLINT OR CAST DO NOT [place]   If your splint gets wet for any reason please contact the office immediately. You may shower in your splint or cast as long as you keep it dry.  This can be done by wrapping in a cast cover or garbage back (or similar)  Do Not stick any thing down your splint or cast such as pencils, money, or hangers to try and scratch yourself with.  If you feel itchy take benadryl as prescribed on the bottle for itching  IF YOU ARE IN A CAM BOOT (BLACK BOOT)  You may remove boot periodically. Perform daily dressing changes as noted below.  Wash the liner  of the boot regularly and wear a sock when wearing the boot. It is recommended that you sleep in the boot until told otherwise  CALL THE OFFICE WITH ANY QUESTIONS OR CONCERTS: [phone]     Discharge  Pin Site Instructions  Dress pins daily with Kerlix roll starting on POD 2. Wrap the Kerlix so that it tamps the skin down around the pin-skin interface to prevent/limit motion of the skin relative to the pin.  (Pin-skin motion is the primary cause of pain and infection related to external fixator pin sites).  Remove any crust or coagulum that may obstruct drainage with a saline moistened gauze or soap and water.  After POD 3, if there is no discernable drainage on the pin site dressing, the interval for change can by increased to every other day.  You may shower with the fixator, cleaning all pin sites gently with soap and water.  If you have a surgical wound this needs to be completely dry and without drainage before showering.  The extremity can be lifted by the fixator to facilitate wound care and transfers.  Notify the office/Doctor if you experience increasing drainage, redness, or pain from a pin site, or if you notice purulent (thick, snot-like) drainage.  Discharge Wound Care Instructions  Do NOT apply any ointments, solutions or lotions to pin sites or surgical wounds.  These prevent needed drainage and even though solutions like hydrogen peroxide kill bacteria, they also damage cells lining the pin sites that help fight infection.  Applying lotions or ointments can keep the wounds moist and can cause them to breakdown and open up as well. This can increase the risk for infection. When in doubt call the office.  Surgical incisions should be dressed daily.  If any drainage is noted, use one layer of adaptic, then gauze, Kerlix, and an ace wrap.  Once the incision is completely dry and without drainage, it may be left open to air out.  Showering may begin 36-48 hours later.  Cleaning gently with soap and water.  Traumatic wounds should be dressed daily as well.    One layer of adaptic, gauze, Kerlix, then ace wrap.  The adaptic can be discontinued once the draining has ceased    If  you have a wet to dry dressing: wet the gauze with saline the squeeze as much saline out so the gauze is moist (not soaking wet), place moistened gauze over wound, then place a dry gauze over the moist one, followed by Kerlix wrap, then ace wrap.     Driving restrictions    Complete by:  As directed   No driving     Increase activity slowly as tolerated    Complete by:  As directed      Non weight bearing    Complete by:  As directed             Medication List    STOP taking these medications        DSS 100 MG Caps      TAKE these medications        aspirin 81 MG chewable tablet  Chew 81 mg by mouth every morning.     Cholecalciferol 2000 UNITS Caps  Take 1 capsule (2,000 Units total) by mouth daily.     hydrOXYzine 50 MG tablet  Commonly known as:  ATARAX/VISTARIL  Take 1 tablet (50 mg total) by mouth 3 (three) times daily as needed for itching, anxiety or nausea.  lansoprazole 30 MG capsule  Commonly known as:  PREVACID  Take 30 mg by mouth every morning.     methocarbamol 500 MG tablet  Commonly known as:  ROBAXIN  Take 1-2 tablets (500-1,000 mg total) by mouth every 6 (six) hours as needed for muscle spasms.     multivitamins ther. w/minerals Tabs tablet  Take 1 tablet by mouth every morning. Centrum Silver     oxyCODONE 5 MG immediate release tablet  Commonly known as:  Oxy IR/ROXICODONE  Take 1-2 tablets (5-10 mg total) by mouth every 6 (six) hours as needed for breakthrough pain (take between percocet).     oxyCODONE-acetaminophen 5-325 MG per tablet  Commonly known as:  PERCOCET/ROXICET  Take 1-2 tablets by mouth every 6 (six) hours as needed for moderate pain or severe pain.     pregabalin 75 MG capsule  Commonly known as:  LYRICA  Take 1 capsule (75 mg total) by mouth 2 (two) times daily.     tamsulosin 0.4 MG Caps capsule  Commonly known as:  FLOMAX  Take 1 capsule (0.4 mg total) by mouth daily after supper.     venlafaxine 37.5 MG tablet   Commonly known as:  EFFEXOR  Take 1 tablet by mouth daily.     Vitamin D (Ergocalciferol) 50000 UNITS Caps capsule  Commonly known as:  DRISDOL  Take 50,000 Units by mouth once a week. On Fridays       Follow-up Information    Follow up with HANDY,MICHAEL H, MD. Schedule an appointment as soon as possible for a visit in 2 weeks.   Specialty:  Orthopedic Surgery   Why:  For wound re-check, For suture removal   Contact information:   [address] [address] [phone]       Discharge Instructions and Plan:  1. R distal tibia nonunion s/p ORIF and grafting             NWB             Unrestricted ROM R ankle and foot             Ice and elevate             Dressing changes prn    2. GERD             protonix  3. Anxiety and depression                   Home meds   4. Pain management:             Percocet             Oxy IR    Robaxin  5. DVT/PE prophylaxis:             mobilize                No pharmacologics at dc   6. Metabolic Bone Disease:             Vitamin D low normal              Continue supplementation   Recheck labs in 2-3 months  7. Activity:             As tolerated  8. FEN           diet as tolerated     Patient will follow-up with orthopedics in 10-14 days. Contact the office with any questions or concerns in  the interim  Signed:  Jari Pigg, PA-C [facility] [phone] (P) 01/21/2014, 9:52 AM

## 2014-01-21 NOTE — Progress Notes (Signed)
Occupational Therapy Treatment Patient Details Name: Kevin Kim MRN: [id number] DOB: 1945-04-18 Today's Date: 01/21/2014    History of present illness s/p OR for repair of nonunion with posterior plate, removal of external fixator   OT comments  Patient progressing towards OT goals, continue plan of care for now. Patient continues to require up to min assist for sit<>stands and functional transfers. Therapist re-iterated importance of 24/7 supervision/assist once discharged for overall safety.    Follow Up Recommendations  Home health OT;Supervision/Assistance - 24 hour    Equipment Recommendations  None recommended by OT    Recommendations for Other Services  None at this time    Precautions / Restrictions Precautions Precautions: Fall Restrictions Weight Bearing Restrictions: Yes RLE Weight Bearing: Non weight bearing       Mobility Bed Mobility Overal bed mobility: Needs Assistance Bed Mobility: Rolling;Supine to Sit Rolling: Supervision   Supine to sit: Supervision     General bed mobility comments: patient requries cues for safety and technique  Transfers Overall transfer level: Needs assistance Equipment used: Rolling walker (2 wheeled) Transfers: Sit to/from Stand Sit to Stand: Min assist         General transfer comment: patient requries cues for safety and technique, patient adhered to NWB status independently during sit<>stand        ADL Overall ADL's : Needs assistance/impaired Eating/Feeding: Independent   Grooming: Sitting;Set up   Upper Body Bathing: Set up;Sitting   Lower Body Bathing: Minimal assistance;Sit to/from stand;Cueing for safety   Upper Body Dressing : Set up;Sitting   Lower Body Dressing: Sit to/from stand;Cueing for safety;Minimal assistance   Functional mobility during ADLs: Minimal assistance;Cueing for safety;Rolling walker General ADL Comments: Educated patient on safety with RW during sit<>stand ADL, LB  dressing using reacher, problem solved way for patient to transfer in/out tub/shower using BSC.                 Cognition   Behavior During Therapy: WFL for tasks assessed/performed Overall Cognitive Status: Within Functional Limits for tasks assessed                 Pertinent Vitals/ Pain       Pain Assessment: No/denies pain         Frequency Min 2X/week     Progress Toward Goals  OT Goals(current goals can now be found in the care plan section)  Progress towards OT goals: Progressing toward goals   Plan Discharge plan remains appropriate       End of Session Equipment Utilized During Treatment: Rolling walker;Other (comment) (Prafo boot > RLE)   Activity Tolerance Patient tolerated treatment well   Patient Left in chair;with call bell/phone within reach     Time: 1132-1146 OT Time Calculation (min): 14 min  Charges: OT General Charges $OT Visit: 1 Procedure OT Treatments $Self Care/Home Management : 8-22 mins  Lola Lofaro , MS, OTR/L, CLT Pager: [phone]  01/21/2014, 11:51 AM

## 2014-01-21 NOTE — Discharge Instructions (Signed)
Orthopaedic Trauma Service Discharge Instructions   General Discharge Instructions  WEIGHT BEARING STATUS: Nonweightbearing Right Leg   RANGE OF MOTION/ACTIVITY: Range of motion R ankle as tolerated. Ok to come out of boot to work on ankle range of motion   Wound Care: daily dry dressing changes to start on 01/23/2014. Do not apply ointments, lotions, solutions (such as iodine, hydrogen peroxide) to wound  Diet: as you were eating previously.  Can use over the counter stool softeners and bowel preparations, such as Miralax, to help with bowel movements.  Narcotics can be constipating.  Be sure to drink plenty of fluids  STOP SMOKING OR USING NICOTINE PRODUCTS!!!!  As discussed nicotine severely impairs your body's ability to heal surgical and traumatic wounds but also impairs bone healing.  Wounds and bone heal by forming microscopic blood vessels (angiogenesis) and nicotine is a vasoconstrictor (essentially, shrinks blood vessels).  Therefore, if vasoconstriction occurs to these microscopic blood vessels they essentially disappear and are unable to deliver necessary nutrients to the healing tissue.  This is one modifiable factor that you can do to dramatically increase your chances of healing your injury.    (This means no smoking, no nicotine gum, patches, etc)  DO NOT USE NONSTEROIDAL ANTI-INFLAMMATORY DRUGS (NSAID'S)  Using products such as Advil (ibuprofen), Aleve (naproxen), Motrin (ibuprofen) for additional pain control during fracture healing can delay and/or prevent the healing response.  If you would like to take over the counter (OTC) medication, Tylenol (acetaminophen) is ok.  However, some narcotic medications that are given for pain control contain acetaminophen as well. Therefore, you should not exceed more than 4000 mg of tylenol in a day if you do not have liver disease.  Also note that there are may OTC medicines, such as cold medicines and allergy medicines that my contain tylenol  as well.  If you have any questions about medications and/or interactions please ask your doctor/PA or your pharmacist.   PAIN MEDICATION USE AND EXPECTATIONS  You have likely been given narcotic medications to help control your pain.  After a traumatic event that results in an fracture (broken bone) with or without surgery, it is ok to use narcotic pain medications to help control one's pain.  We understand that everyone responds to pain differently and each individual patient will be evaluated on a regular basis for the continued need for narcotic medications. Ideally, narcotic medication use should last no more than 6-8 weeks (coinciding with fracture healing).   As a patient it is your responsibility as well to monitor narcotic medication use and report the amount and frequency you use these medications when you come to your office visit.   We would also advise that if you are using narcotic medications, you should take a dose prior to therapy to maximize you participation.  IF YOU ARE ON NARCOTIC MEDICATIONS IT IS NOT PERMISSIBLE TO OPERATE A MOTOR VEHICLE (MOTORCYCLE/CAR/TRUCK/MOPED) OR HEAVY MACHINERY DO NOT MIX NARCOTICS WITH OTHER CNS (CENTRAL NERVOUS SYSTEM) DEPRESSANTS SUCH AS ALCOHOL       ICE AND ELEVATE INJURED/OPERATIVE EXTREMITY  Using ice and elevating the injured extremity above your heart can help with swelling and pain control.  Icing in a pulsatile fashion, such as 20 minutes on and 20 minutes off, can be followed.    Do not place ice directly on skin. Make sure there is a barrier between to skin and the ice pack.    Using frozen items such as frozen peas works well as the  conform nicely to the are that needs to be iced.  USE AN ACE WRAP OR TED HOSE FOR SWELLING CONTROL  In addition to icing and elevation, Ace wraps or TED hose are used to help limit and resolve swelling.  It is recommended to use Ace wraps or TED hose until you are informed to stop.    When using Ace Wraps  start the wrapping distally (farthest away from the body) and wrap proximally (closer to the body)   Example: If you had surgery on your leg or thing and you do not have a splint on, start the ace wrap at the toes and work your way up to the thigh        If you had surgery on your upper extremity and do not have a splint on, start the ace wrap at your fingers and work your way up to the upper arm  IF YOU ARE IN A SPLINT OR CAST DO NOT [place]   If your splint gets wet for any reason please contact the office immediately. You may shower in your splint or cast as long as you keep it dry.  This can be done by wrapping in a cast cover or garbage back (or similar)  Do Not stick any thing down your splint or cast such as pencils, money, or hangers to try and scratch yourself with.  If you feel itchy take benadryl as prescribed on the bottle for itching  IF YOU ARE IN A CAM BOOT (BLACK BOOT)  You may remove boot periodically. Perform daily dressing changes as noted below.  Wash the liner of the boot regularly and wear a sock when wearing the boot. It is recommended that you sleep in the boot until told otherwise  CALL THE OFFICE WITH ANY QUESTIONS OR CONCERTS: [phone]     Discharge Pin Site Instructions  Dress pins daily with Kerlix roll starting on POD 2. Wrap the Kerlix so that it tamps the skin down around the pin-skin interface to prevent/limit motion of the skin relative to the pin.  (Pin-skin motion is the primary cause of pain and infection related to external fixator pin sites).  Remove any crust or coagulum that may obstruct drainage with a saline moistened gauze or soap and water.  After POD 3, if there is no discernable drainage on the pin site dressing, the interval for change can by increased to every other day.  You may shower with the fixator, cleaning all pin sites gently with soap and water.  If you have a surgical wound this needs to be completely dry and  without drainage before showering.  The extremity can be lifted by the fixator to facilitate wound care and transfers.  Notify the office/Doctor if you experience increasing drainage, redness, or pain from a pin site, or if you notice purulent (thick, snot-like) drainage.  Discharge Wound Care Instructions  Do NOT apply any ointments, solutions or lotions to pin sites or surgical wounds.  These prevent needed drainage and even though solutions like hydrogen peroxide kill bacteria, they also damage cells lining the pin sites that help fight infection.  Applying lotions or ointments can keep the wounds moist and can cause them to breakdown and open up as well. This can increase the risk for infection. When in doubt call the office.  Surgical incisions should be dressed daily.  If any drainage is noted, use one layer of adaptic, then gauze, Kerlix, and an ace wrap.  Once the incision is completely dry and without drainage, it may be left open to air out.  Showering may begin 36-48 hours later.  Cleaning gently with soap and water.  Traumatic wounds should be dressed daily as well.    One layer of adaptic, gauze, Kerlix, then ace wrap.  The adaptic can be discontinued once the draining has ceased    If you have a wet to dry dressing: wet the gauze with saline the squeeze as much saline out so the gauze is moist (not soaking wet), place moistened gauze over wound, then place a dry gauze over the moist one, followed by Kerlix wrap, then ace wrap.

## 2014-01-22 DIAGNOSIS — S82201D Unspecified fracture of shaft of right tibia, subsequent encounter for closed fracture with routine healing: Secondary | ICD-10-CM | POA: Diagnosis not present

## 2014-01-22 DIAGNOSIS — S82401D Unspecified fracture of shaft of right fibula, subsequent encounter for closed fracture with routine healing: Secondary | ICD-10-CM | POA: Diagnosis not present

## 2014-01-22 DIAGNOSIS — F329 Major depressive disorder, single episode, unspecified: Secondary | ICD-10-CM | POA: Diagnosis not present

## 2014-01-22 DIAGNOSIS — E46 Unspecified protein-calorie malnutrition: Secondary | ICD-10-CM | POA: Diagnosis not present

## 2014-01-22 DIAGNOSIS — Z48817 Encounter for surgical aftercare following surgery on the skin and subcutaneous tissue: Secondary | ICD-10-CM | POA: Diagnosis not present

## 2014-01-22 DIAGNOSIS — F419 Anxiety disorder, unspecified: Secondary | ICD-10-CM | POA: Diagnosis not present

## 2014-01-22 LAB — TISSUE CULTURE
Culture: NO GROWTH
Gram Stain: NONE SEEN

## 2014-01-24 LAB — ANAEROBIC CULTURE: GRAM STAIN: NONE SEEN

## 2014-01-25 DIAGNOSIS — S82201D Unspecified fracture of shaft of right tibia, subsequent encounter for closed fracture with routine healing: Secondary | ICD-10-CM | POA: Diagnosis not present

## 2014-01-25 DIAGNOSIS — Z48817 Encounter for surgical aftercare following surgery on the skin and subcutaneous tissue: Secondary | ICD-10-CM | POA: Diagnosis not present

## 2014-01-25 DIAGNOSIS — S82401D Unspecified fracture of shaft of right fibula, subsequent encounter for closed fracture with routine healing: Secondary | ICD-10-CM | POA: Diagnosis not present

## 2014-01-25 DIAGNOSIS — E46 Unspecified protein-calorie malnutrition: Secondary | ICD-10-CM | POA: Diagnosis not present

## 2014-01-25 DIAGNOSIS — F329 Major depressive disorder, single episode, unspecified: Secondary | ICD-10-CM | POA: Diagnosis not present

## 2014-01-25 DIAGNOSIS — F419 Anxiety disorder, unspecified: Secondary | ICD-10-CM | POA: Diagnosis not present

## 2014-01-25 LAB — VITAMIN D 1,25 DIHYDROXY
Vitamin D 1, 25 (OH)2 Total: 36 pg/mL (ref 18–72)
Vitamin D2 1, 25 (OH)2: 22 pg/mL
Vitamin D3 1, 25 (OH)2: 14 pg/mL

## 2014-01-26 DIAGNOSIS — F329 Major depressive disorder, single episode, unspecified: Secondary | ICD-10-CM | POA: Diagnosis not present

## 2014-01-26 DIAGNOSIS — S82201D Unspecified fracture of shaft of right tibia, subsequent encounter for closed fracture with routine healing: Secondary | ICD-10-CM | POA: Diagnosis not present

## 2014-01-26 DIAGNOSIS — S82401D Unspecified fracture of shaft of right fibula, subsequent encounter for closed fracture with routine healing: Secondary | ICD-10-CM | POA: Diagnosis not present

## 2014-01-26 DIAGNOSIS — Z48817 Encounter for surgical aftercare following surgery on the skin and subcutaneous tissue: Secondary | ICD-10-CM | POA: Diagnosis not present

## 2014-01-26 DIAGNOSIS — F419 Anxiety disorder, unspecified: Secondary | ICD-10-CM | POA: Diagnosis not present

## 2014-01-26 DIAGNOSIS — E46 Unspecified protein-calorie malnutrition: Secondary | ICD-10-CM | POA: Diagnosis not present

## 2014-01-28 DIAGNOSIS — S82401D Unspecified fracture of shaft of right fibula, subsequent encounter for closed fracture with routine healing: Secondary | ICD-10-CM | POA: Diagnosis not present

## 2014-01-28 DIAGNOSIS — Z48817 Encounter for surgical aftercare following surgery on the skin and subcutaneous tissue: Secondary | ICD-10-CM | POA: Diagnosis not present

## 2014-01-28 DIAGNOSIS — F419 Anxiety disorder, unspecified: Secondary | ICD-10-CM | POA: Diagnosis not present

## 2014-01-28 DIAGNOSIS — F329 Major depressive disorder, single episode, unspecified: Secondary | ICD-10-CM | POA: Diagnosis not present

## 2014-01-28 DIAGNOSIS — S82201D Unspecified fracture of shaft of right tibia, subsequent encounter for closed fracture with routine healing: Secondary | ICD-10-CM | POA: Diagnosis not present

## 2014-01-28 DIAGNOSIS — E46 Unspecified protein-calorie malnutrition: Secondary | ICD-10-CM | POA: Diagnosis not present

## 2014-01-29 DIAGNOSIS — F329 Major depressive disorder, single episode, unspecified: Secondary | ICD-10-CM | POA: Diagnosis not present

## 2014-01-29 DIAGNOSIS — S82201D Unspecified fracture of shaft of right tibia, subsequent encounter for closed fracture with routine healing: Secondary | ICD-10-CM | POA: Diagnosis not present

## 2014-01-29 DIAGNOSIS — E46 Unspecified protein-calorie malnutrition: Secondary | ICD-10-CM | POA: Diagnosis not present

## 2014-01-29 DIAGNOSIS — Z48817 Encounter for surgical aftercare following surgery on the skin and subcutaneous tissue: Secondary | ICD-10-CM | POA: Diagnosis not present

## 2014-01-29 DIAGNOSIS — F419 Anxiety disorder, unspecified: Secondary | ICD-10-CM | POA: Diagnosis not present

## 2014-01-29 DIAGNOSIS — S82401D Unspecified fracture of shaft of right fibula, subsequent encounter for closed fracture with routine healing: Secondary | ICD-10-CM | POA: Diagnosis not present

## 2014-02-01 DIAGNOSIS — E46 Unspecified protein-calorie malnutrition: Secondary | ICD-10-CM | POA: Diagnosis not present

## 2014-02-01 DIAGNOSIS — S82201D Unspecified fracture of shaft of right tibia, subsequent encounter for closed fracture with routine healing: Secondary | ICD-10-CM | POA: Diagnosis not present

## 2014-02-01 DIAGNOSIS — Z48817 Encounter for surgical aftercare following surgery on the skin and subcutaneous tissue: Secondary | ICD-10-CM | POA: Diagnosis not present

## 2014-02-01 DIAGNOSIS — F329 Major depressive disorder, single episode, unspecified: Secondary | ICD-10-CM | POA: Diagnosis not present

## 2014-02-01 DIAGNOSIS — F419 Anxiety disorder, unspecified: Secondary | ICD-10-CM | POA: Diagnosis not present

## 2014-02-01 DIAGNOSIS — S82401D Unspecified fracture of shaft of right fibula, subsequent encounter for closed fracture with routine healing: Secondary | ICD-10-CM | POA: Diagnosis not present

## 2014-02-03 DIAGNOSIS — Z48817 Encounter for surgical aftercare following surgery on the skin and subcutaneous tissue: Secondary | ICD-10-CM | POA: Diagnosis not present

## 2014-02-03 DIAGNOSIS — E46 Unspecified protein-calorie malnutrition: Secondary | ICD-10-CM | POA: Diagnosis not present

## 2014-02-03 DIAGNOSIS — F419 Anxiety disorder, unspecified: Secondary | ICD-10-CM | POA: Diagnosis not present

## 2014-02-03 DIAGNOSIS — F329 Major depressive disorder, single episode, unspecified: Secondary | ICD-10-CM | POA: Diagnosis not present

## 2014-02-03 DIAGNOSIS — S82201D Unspecified fracture of shaft of right tibia, subsequent encounter for closed fracture with routine healing: Secondary | ICD-10-CM | POA: Diagnosis not present

## 2014-02-03 DIAGNOSIS — S82391N Other fracture of lower end of right tibia, subsequent encounter for open fracture type IIIA, IIIB, or IIIC with nonunion: Secondary | ICD-10-CM | POA: Diagnosis not present

## 2014-02-03 DIAGNOSIS — S82401D Unspecified fracture of shaft of right fibula, subsequent encounter for closed fracture with routine healing: Secondary | ICD-10-CM | POA: Diagnosis not present

## 2014-02-09 DIAGNOSIS — S82401D Unspecified fracture of shaft of right fibula, subsequent encounter for closed fracture with routine healing: Secondary | ICD-10-CM | POA: Diagnosis not present

## 2014-02-09 DIAGNOSIS — F329 Major depressive disorder, single episode, unspecified: Secondary | ICD-10-CM | POA: Diagnosis not present

## 2014-02-09 DIAGNOSIS — S82201D Unspecified fracture of shaft of right tibia, subsequent encounter for closed fracture with routine healing: Secondary | ICD-10-CM | POA: Diagnosis not present

## 2014-02-09 DIAGNOSIS — F419 Anxiety disorder, unspecified: Secondary | ICD-10-CM | POA: Diagnosis not present

## 2014-02-09 DIAGNOSIS — E46 Unspecified protein-calorie malnutrition: Secondary | ICD-10-CM | POA: Diagnosis not present

## 2014-02-09 DIAGNOSIS — Z48817 Encounter for surgical aftercare following surgery on the skin and subcutaneous tissue: Secondary | ICD-10-CM | POA: Diagnosis not present

## 2014-02-11 DIAGNOSIS — Z48817 Encounter for surgical aftercare following surgery on the skin and subcutaneous tissue: Secondary | ICD-10-CM | POA: Diagnosis not present

## 2014-02-11 DIAGNOSIS — S82401D Unspecified fracture of shaft of right fibula, subsequent encounter for closed fracture with routine healing: Secondary | ICD-10-CM | POA: Diagnosis not present

## 2014-02-11 DIAGNOSIS — E46 Unspecified protein-calorie malnutrition: Secondary | ICD-10-CM | POA: Diagnosis not present

## 2014-02-11 DIAGNOSIS — F419 Anxiety disorder, unspecified: Secondary | ICD-10-CM | POA: Diagnosis not present

## 2014-02-11 DIAGNOSIS — S82201D Unspecified fracture of shaft of right tibia, subsequent encounter for closed fracture with routine healing: Secondary | ICD-10-CM | POA: Diagnosis not present

## 2014-02-11 DIAGNOSIS — F329 Major depressive disorder, single episode, unspecified: Secondary | ICD-10-CM | POA: Diagnosis not present

## 2014-02-13 DIAGNOSIS — E46 Unspecified protein-calorie malnutrition: Secondary | ICD-10-CM | POA: Diagnosis not present

## 2014-02-13 DIAGNOSIS — S82201D Unspecified fracture of shaft of right tibia, subsequent encounter for closed fracture with routine healing: Secondary | ICD-10-CM | POA: Diagnosis not present

## 2014-02-13 DIAGNOSIS — Z48817 Encounter for surgical aftercare following surgery on the skin and subcutaneous tissue: Secondary | ICD-10-CM | POA: Diagnosis not present

## 2014-02-13 DIAGNOSIS — F419 Anxiety disorder, unspecified: Secondary | ICD-10-CM | POA: Diagnosis not present

## 2014-02-13 DIAGNOSIS — S82401A Unspecified fracture of shaft of right fibula, initial encounter for closed fracture: Secondary | ICD-10-CM | POA: Diagnosis not present

## 2014-02-13 DIAGNOSIS — F329 Major depressive disorder, single episode, unspecified: Secondary | ICD-10-CM | POA: Diagnosis not present

## 2014-02-15 DIAGNOSIS — E46 Unspecified protein-calorie malnutrition: Secondary | ICD-10-CM | POA: Diagnosis not present

## 2014-02-15 DIAGNOSIS — S82201D Unspecified fracture of shaft of right tibia, subsequent encounter for closed fracture with routine healing: Secondary | ICD-10-CM | POA: Diagnosis not present

## 2014-02-15 DIAGNOSIS — F419 Anxiety disorder, unspecified: Secondary | ICD-10-CM | POA: Diagnosis not present

## 2014-02-15 DIAGNOSIS — S82401A Unspecified fracture of shaft of right fibula, initial encounter for closed fracture: Secondary | ICD-10-CM | POA: Diagnosis not present

## 2014-02-15 DIAGNOSIS — F329 Major depressive disorder, single episode, unspecified: Secondary | ICD-10-CM | POA: Diagnosis not present

## 2014-02-15 DIAGNOSIS — Z48817 Encounter for surgical aftercare following surgery on the skin and subcutaneous tissue: Secondary | ICD-10-CM | POA: Diagnosis not present

## 2014-02-17 DIAGNOSIS — S82391N Other fracture of lower end of right tibia, subsequent encounter for open fracture type IIIA, IIIB, or IIIC with nonunion: Secondary | ICD-10-CM | POA: Diagnosis not present

## 2014-02-19 DIAGNOSIS — F329 Major depressive disorder, single episode, unspecified: Secondary | ICD-10-CM | POA: Diagnosis not present

## 2014-02-19 DIAGNOSIS — Z48817 Encounter for surgical aftercare following surgery on the skin and subcutaneous tissue: Secondary | ICD-10-CM | POA: Diagnosis not present

## 2014-02-19 DIAGNOSIS — F419 Anxiety disorder, unspecified: Secondary | ICD-10-CM | POA: Diagnosis not present

## 2014-02-19 DIAGNOSIS — S82201D Unspecified fracture of shaft of right tibia, subsequent encounter for closed fracture with routine healing: Secondary | ICD-10-CM | POA: Diagnosis not present

## 2014-02-19 DIAGNOSIS — S82401A Unspecified fracture of shaft of right fibula, initial encounter for closed fracture: Secondary | ICD-10-CM | POA: Diagnosis not present

## 2014-02-19 DIAGNOSIS — E46 Unspecified protein-calorie malnutrition: Secondary | ICD-10-CM | POA: Diagnosis not present

## 2014-02-23 DIAGNOSIS — S82401A Unspecified fracture of shaft of right fibula, initial encounter for closed fracture: Secondary | ICD-10-CM | POA: Diagnosis not present

## 2014-02-23 DIAGNOSIS — F329 Major depressive disorder, single episode, unspecified: Secondary | ICD-10-CM | POA: Diagnosis not present

## 2014-02-23 DIAGNOSIS — S82201D Unspecified fracture of shaft of right tibia, subsequent encounter for closed fracture with routine healing: Secondary | ICD-10-CM | POA: Diagnosis not present

## 2014-02-23 DIAGNOSIS — F419 Anxiety disorder, unspecified: Secondary | ICD-10-CM | POA: Diagnosis not present

## 2014-02-23 DIAGNOSIS — Z48817 Encounter for surgical aftercare following surgery on the skin and subcutaneous tissue: Secondary | ICD-10-CM | POA: Diagnosis not present

## 2014-02-23 DIAGNOSIS — E46 Unspecified protein-calorie malnutrition: Secondary | ICD-10-CM | POA: Diagnosis not present

## 2014-02-25 DIAGNOSIS — S82201D Unspecified fracture of shaft of right tibia, subsequent encounter for closed fracture with routine healing: Secondary | ICD-10-CM | POA: Diagnosis not present

## 2014-02-25 DIAGNOSIS — F329 Major depressive disorder, single episode, unspecified: Secondary | ICD-10-CM | POA: Diagnosis not present

## 2014-02-25 DIAGNOSIS — Z48817 Encounter for surgical aftercare following surgery on the skin and subcutaneous tissue: Secondary | ICD-10-CM | POA: Diagnosis not present

## 2014-02-25 DIAGNOSIS — S82401A Unspecified fracture of shaft of right fibula, initial encounter for closed fracture: Secondary | ICD-10-CM | POA: Diagnosis not present

## 2014-02-25 DIAGNOSIS — F419 Anxiety disorder, unspecified: Secondary | ICD-10-CM | POA: Diagnosis not present

## 2014-02-25 DIAGNOSIS — E46 Unspecified protein-calorie malnutrition: Secondary | ICD-10-CM | POA: Diagnosis not present

## 2014-03-02 DIAGNOSIS — F329 Major depressive disorder, single episode, unspecified: Secondary | ICD-10-CM | POA: Diagnosis not present

## 2014-03-02 DIAGNOSIS — F419 Anxiety disorder, unspecified: Secondary | ICD-10-CM | POA: Diagnosis not present

## 2014-03-02 DIAGNOSIS — S82201D Unspecified fracture of shaft of right tibia, subsequent encounter for closed fracture with routine healing: Secondary | ICD-10-CM | POA: Diagnosis not present

## 2014-03-02 DIAGNOSIS — E46 Unspecified protein-calorie malnutrition: Secondary | ICD-10-CM | POA: Diagnosis not present

## 2014-03-02 DIAGNOSIS — Z48817 Encounter for surgical aftercare following surgery on the skin and subcutaneous tissue: Secondary | ICD-10-CM | POA: Diagnosis not present

## 2014-03-02 DIAGNOSIS — S82401A Unspecified fracture of shaft of right fibula, initial encounter for closed fracture: Secondary | ICD-10-CM | POA: Diagnosis not present

## 2014-03-03 DIAGNOSIS — S82391N Other fracture of lower end of right tibia, subsequent encounter for open fracture type IIIA, IIIB, or IIIC with nonunion: Secondary | ICD-10-CM | POA: Diagnosis not present

## 2014-03-04 DIAGNOSIS — S82201D Unspecified fracture of shaft of right tibia, subsequent encounter for closed fracture with routine healing: Secondary | ICD-10-CM | POA: Diagnosis not present

## 2014-03-04 DIAGNOSIS — E46 Unspecified protein-calorie malnutrition: Secondary | ICD-10-CM | POA: Diagnosis not present

## 2014-03-04 DIAGNOSIS — S82401A Unspecified fracture of shaft of right fibula, initial encounter for closed fracture: Secondary | ICD-10-CM | POA: Diagnosis not present

## 2014-03-04 DIAGNOSIS — F329 Major depressive disorder, single episode, unspecified: Secondary | ICD-10-CM | POA: Diagnosis not present

## 2014-03-04 DIAGNOSIS — F419 Anxiety disorder, unspecified: Secondary | ICD-10-CM | POA: Diagnosis not present

## 2014-03-04 DIAGNOSIS — Z48817 Encounter for surgical aftercare following surgery on the skin and subcutaneous tissue: Secondary | ICD-10-CM | POA: Diagnosis not present

## 2014-03-09 DIAGNOSIS — S82401A Unspecified fracture of shaft of right fibula, initial encounter for closed fracture: Secondary | ICD-10-CM | POA: Diagnosis not present

## 2014-03-09 DIAGNOSIS — Z48817 Encounter for surgical aftercare following surgery on the skin and subcutaneous tissue: Secondary | ICD-10-CM | POA: Diagnosis not present

## 2014-03-09 DIAGNOSIS — S82201D Unspecified fracture of shaft of right tibia, subsequent encounter for closed fracture with routine healing: Secondary | ICD-10-CM | POA: Diagnosis not present

## 2014-03-09 DIAGNOSIS — F419 Anxiety disorder, unspecified: Secondary | ICD-10-CM | POA: Diagnosis not present

## 2014-03-09 DIAGNOSIS — F329 Major depressive disorder, single episode, unspecified: Secondary | ICD-10-CM | POA: Diagnosis not present

## 2014-03-09 DIAGNOSIS — E46 Unspecified protein-calorie malnutrition: Secondary | ICD-10-CM | POA: Diagnosis not present

## 2014-03-11 DIAGNOSIS — Z48817 Encounter for surgical aftercare following surgery on the skin and subcutaneous tissue: Secondary | ICD-10-CM | POA: Diagnosis not present

## 2014-03-11 DIAGNOSIS — S82201D Unspecified fracture of shaft of right tibia, subsequent encounter for closed fracture with routine healing: Secondary | ICD-10-CM | POA: Diagnosis not present

## 2014-03-11 DIAGNOSIS — E46 Unspecified protein-calorie malnutrition: Secondary | ICD-10-CM | POA: Diagnosis not present

## 2014-03-11 DIAGNOSIS — F329 Major depressive disorder, single episode, unspecified: Secondary | ICD-10-CM | POA: Diagnosis not present

## 2014-03-11 DIAGNOSIS — S82401A Unspecified fracture of shaft of right fibula, initial encounter for closed fracture: Secondary | ICD-10-CM | POA: Diagnosis not present

## 2014-03-11 DIAGNOSIS — F419 Anxiety disorder, unspecified: Secondary | ICD-10-CM | POA: Diagnosis not present

## 2014-03-16 DIAGNOSIS — F419 Anxiety disorder, unspecified: Secondary | ICD-10-CM | POA: Diagnosis not present

## 2014-03-16 DIAGNOSIS — Z48817 Encounter for surgical aftercare following surgery on the skin and subcutaneous tissue: Secondary | ICD-10-CM | POA: Diagnosis not present

## 2014-03-16 DIAGNOSIS — E46 Unspecified protein-calorie malnutrition: Secondary | ICD-10-CM | POA: Diagnosis not present

## 2014-03-16 DIAGNOSIS — S82201D Unspecified fracture of shaft of right tibia, subsequent encounter for closed fracture with routine healing: Secondary | ICD-10-CM | POA: Diagnosis not present

## 2014-03-16 DIAGNOSIS — F329 Major depressive disorder, single episode, unspecified: Secondary | ICD-10-CM | POA: Diagnosis not present

## 2014-03-16 DIAGNOSIS — S82401A Unspecified fracture of shaft of right fibula, initial encounter for closed fracture: Secondary | ICD-10-CM | POA: Diagnosis not present

## 2014-03-18 DIAGNOSIS — S82201D Unspecified fracture of shaft of right tibia, subsequent encounter for closed fracture with routine healing: Secondary | ICD-10-CM | POA: Diagnosis not present

## 2014-03-18 DIAGNOSIS — F329 Major depressive disorder, single episode, unspecified: Secondary | ICD-10-CM | POA: Diagnosis not present

## 2014-03-18 DIAGNOSIS — S82401A Unspecified fracture of shaft of right fibula, initial encounter for closed fracture: Secondary | ICD-10-CM | POA: Diagnosis not present

## 2014-03-18 DIAGNOSIS — E46 Unspecified protein-calorie malnutrition: Secondary | ICD-10-CM | POA: Diagnosis not present

## 2014-03-18 DIAGNOSIS — Z48817 Encounter for surgical aftercare following surgery on the skin and subcutaneous tissue: Secondary | ICD-10-CM | POA: Diagnosis not present

## 2014-03-18 DIAGNOSIS — F419 Anxiety disorder, unspecified: Secondary | ICD-10-CM | POA: Diagnosis not present

## 2014-03-23 DIAGNOSIS — E46 Unspecified protein-calorie malnutrition: Secondary | ICD-10-CM | POA: Diagnosis not present

## 2014-03-23 DIAGNOSIS — S82401A Unspecified fracture of shaft of right fibula, initial encounter for closed fracture: Secondary | ICD-10-CM | POA: Diagnosis not present

## 2014-03-23 DIAGNOSIS — S82201D Unspecified fracture of shaft of right tibia, subsequent encounter for closed fracture with routine healing: Secondary | ICD-10-CM | POA: Diagnosis not present

## 2014-03-23 DIAGNOSIS — Z48817 Encounter for surgical aftercare following surgery on the skin and subcutaneous tissue: Secondary | ICD-10-CM | POA: Diagnosis not present

## 2014-03-23 DIAGNOSIS — F419 Anxiety disorder, unspecified: Secondary | ICD-10-CM | POA: Diagnosis not present

## 2014-03-23 DIAGNOSIS — F329 Major depressive disorder, single episode, unspecified: Secondary | ICD-10-CM | POA: Diagnosis not present

## 2014-03-25 DIAGNOSIS — F419 Anxiety disorder, unspecified: Secondary | ICD-10-CM | POA: Diagnosis not present

## 2014-03-25 DIAGNOSIS — S82401A Unspecified fracture of shaft of right fibula, initial encounter for closed fracture: Secondary | ICD-10-CM | POA: Diagnosis not present

## 2014-03-25 DIAGNOSIS — F329 Major depressive disorder, single episode, unspecified: Secondary | ICD-10-CM | POA: Diagnosis not present

## 2014-03-25 DIAGNOSIS — Z48817 Encounter for surgical aftercare following surgery on the skin and subcutaneous tissue: Secondary | ICD-10-CM | POA: Diagnosis not present

## 2014-03-25 DIAGNOSIS — S82201D Unspecified fracture of shaft of right tibia, subsequent encounter for closed fracture with routine healing: Secondary | ICD-10-CM | POA: Diagnosis not present

## 2014-03-25 DIAGNOSIS — E46 Unspecified protein-calorie malnutrition: Secondary | ICD-10-CM | POA: Diagnosis not present

## 2014-03-29 DIAGNOSIS — F419 Anxiety disorder, unspecified: Secondary | ICD-10-CM | POA: Diagnosis not present

## 2014-03-29 DIAGNOSIS — F329 Major depressive disorder, single episode, unspecified: Secondary | ICD-10-CM | POA: Diagnosis not present

## 2014-03-29 DIAGNOSIS — S82401A Unspecified fracture of shaft of right fibula, initial encounter for closed fracture: Secondary | ICD-10-CM | POA: Diagnosis not present

## 2014-03-29 DIAGNOSIS — Z48817 Encounter for surgical aftercare following surgery on the skin and subcutaneous tissue: Secondary | ICD-10-CM | POA: Diagnosis not present

## 2014-03-29 DIAGNOSIS — S82201D Unspecified fracture of shaft of right tibia, subsequent encounter for closed fracture with routine healing: Secondary | ICD-10-CM | POA: Diagnosis not present

## 2014-03-29 DIAGNOSIS — E46 Unspecified protein-calorie malnutrition: Secondary | ICD-10-CM | POA: Diagnosis not present

## 2014-03-31 DIAGNOSIS — S82391N Other fracture of lower end of right tibia, subsequent encounter for open fracture type IIIA, IIIB, or IIIC with nonunion: Secondary | ICD-10-CM | POA: Diagnosis not present

## 2014-04-01 DIAGNOSIS — F329 Major depressive disorder, single episode, unspecified: Secondary | ICD-10-CM | POA: Diagnosis not present

## 2014-04-01 DIAGNOSIS — F419 Anxiety disorder, unspecified: Secondary | ICD-10-CM | POA: Diagnosis not present

## 2014-04-01 DIAGNOSIS — S82401A Unspecified fracture of shaft of right fibula, initial encounter for closed fracture: Secondary | ICD-10-CM | POA: Diagnosis not present

## 2014-04-01 DIAGNOSIS — Z48817 Encounter for surgical aftercare following surgery on the skin and subcutaneous tissue: Secondary | ICD-10-CM | POA: Diagnosis not present

## 2014-04-01 DIAGNOSIS — E46 Unspecified protein-calorie malnutrition: Secondary | ICD-10-CM | POA: Diagnosis not present

## 2014-04-01 DIAGNOSIS — S82201D Unspecified fracture of shaft of right tibia, subsequent encounter for closed fracture with routine healing: Secondary | ICD-10-CM | POA: Diagnosis not present

## 2014-04-05 DIAGNOSIS — F419 Anxiety disorder, unspecified: Secondary | ICD-10-CM | POA: Diagnosis not present

## 2014-04-05 DIAGNOSIS — S82201D Unspecified fracture of shaft of right tibia, subsequent encounter for closed fracture with routine healing: Secondary | ICD-10-CM | POA: Diagnosis not present

## 2014-04-05 DIAGNOSIS — Z48817 Encounter for surgical aftercare following surgery on the skin and subcutaneous tissue: Secondary | ICD-10-CM | POA: Diagnosis not present

## 2014-04-05 DIAGNOSIS — S82401A Unspecified fracture of shaft of right fibula, initial encounter for closed fracture: Secondary | ICD-10-CM | POA: Diagnosis not present

## 2014-04-05 DIAGNOSIS — F329 Major depressive disorder, single episode, unspecified: Secondary | ICD-10-CM | POA: Diagnosis not present

## 2014-04-05 DIAGNOSIS — E46 Unspecified protein-calorie malnutrition: Secondary | ICD-10-CM | POA: Diagnosis not present

## 2014-04-07 DIAGNOSIS — E46 Unspecified protein-calorie malnutrition: Secondary | ICD-10-CM | POA: Diagnosis not present

## 2014-04-07 DIAGNOSIS — F329 Major depressive disorder, single episode, unspecified: Secondary | ICD-10-CM | POA: Diagnosis not present

## 2014-04-07 DIAGNOSIS — F419 Anxiety disorder, unspecified: Secondary | ICD-10-CM | POA: Diagnosis not present

## 2014-04-07 DIAGNOSIS — S82201D Unspecified fracture of shaft of right tibia, subsequent encounter for closed fracture with routine healing: Secondary | ICD-10-CM | POA: Diagnosis not present

## 2014-04-07 DIAGNOSIS — S82401A Unspecified fracture of shaft of right fibula, initial encounter for closed fracture: Secondary | ICD-10-CM | POA: Diagnosis not present

## 2014-04-07 DIAGNOSIS — Z48817 Encounter for surgical aftercare following surgery on the skin and subcutaneous tissue: Secondary | ICD-10-CM | POA: Diagnosis not present

## 2014-04-13 DIAGNOSIS — F419 Anxiety disorder, unspecified: Secondary | ICD-10-CM | POA: Diagnosis not present

## 2014-04-13 DIAGNOSIS — S82401A Unspecified fracture of shaft of right fibula, initial encounter for closed fracture: Secondary | ICD-10-CM | POA: Diagnosis not present

## 2014-04-13 DIAGNOSIS — E46 Unspecified protein-calorie malnutrition: Secondary | ICD-10-CM | POA: Diagnosis not present

## 2014-04-13 DIAGNOSIS — Z48817 Encounter for surgical aftercare following surgery on the skin and subcutaneous tissue: Secondary | ICD-10-CM | POA: Diagnosis not present

## 2014-04-13 DIAGNOSIS — F329 Major depressive disorder, single episode, unspecified: Secondary | ICD-10-CM | POA: Diagnosis not present

## 2014-04-13 DIAGNOSIS — S82201D Unspecified fracture of shaft of right tibia, subsequent encounter for closed fracture with routine healing: Secondary | ICD-10-CM | POA: Diagnosis not present

## 2014-04-19 ENCOUNTER — Ambulatory Visit: Payer: Medicare Other | Attending: Orthopedic Surgery

## 2014-04-19 DIAGNOSIS — K219 Gastro-esophageal reflux disease without esophagitis: Secondary | ICD-10-CM | POA: Diagnosis not present

## 2014-04-19 DIAGNOSIS — S82871E Displaced pilon fracture of right tibia, subsequent encounter for open fracture type I or II with routine healing: Secondary | ICD-10-CM | POA: Diagnosis not present

## 2014-04-19 DIAGNOSIS — R531 Weakness: Secondary | ICD-10-CM | POA: Diagnosis not present

## 2014-04-19 DIAGNOSIS — R269 Unspecified abnormalities of gait and mobility: Secondary | ICD-10-CM

## 2014-04-19 DIAGNOSIS — Z9889 Other specified postprocedural states: Secondary | ICD-10-CM | POA: Diagnosis not present

## 2014-04-19 DIAGNOSIS — W11XXXD Fall on and from ladder, subsequent encounter: Secondary | ICD-10-CM | POA: Insufficient documentation

## 2014-04-19 DIAGNOSIS — R6889 Other general symptoms and signs: Secondary | ICD-10-CM | POA: Diagnosis not present

## 2014-04-19 DIAGNOSIS — M25571 Pain in right ankle and joints of right foot: Secondary | ICD-10-CM

## 2014-04-19 NOTE — Therapy (Signed)
[facility] [address], [place], [place], [address] Phone: [phone]   Fax:  [phone]  Physical Therapy Evaluation  Patient Details  Name: Kevin Kim MRN: [id number] Date of Birth: 03-24-1945 Referring Provider:  Alt[place], MD  Encounter Date: 04/19/2014      PT End of Session - 04/19/14 1647    Visit Number 1   Number of Visits 10   Date for PT Re-Evaluation 06/14/14  Medicare   PT Start Time 4403   PT Stop Time 1654   PT Time Calculation (min) 44 min   Activity Tolerance Patient tolerated treatment well   Behavior During Therapy [facility] for tasks assessed/performed      Past Medical History  Diagnosis Date  . GERD (gastroesophageal reflux disease)   . Irregular heartbeat     1980's.  . Pneumonia     last time 1993  . Anxiety     while in hospital  . Depression     Past Surgical History  Procedure Laterality Date  . I&d extremity Right 10/24/2013    Procedure: IRRIGATION AND DEBRIDEMENT OPEN RIGHT TIBIA/FIBULA FRACTURE;  Surgeon: Mauri Pole, MD;  Location: [place];  Service: Orthopedics;  Laterality: Right;  . External fixation leg Right 10/24/2013    Procedure: EXTERNAL FIXATION RIGHT LOWER LEG;  Surgeon: Mauri Pole, MD;  Location: [place];  Service: Orthopedics;  Laterality: Right;  . Closed reduction tibia Right 10/24/2013    Procedure: CLOSED REDUCTION TIBIA/FIBULA FRACTURE;  Surgeon: Mauri Pole, MD;  Location: [place];  Service: Orthopedics;  Laterality: Right;  . External fixation leg Right 10/27/2013    Procedure:   REVISION OF  EXTERNAL FIXATOR RIGHT LOWER LEG   ORIF  RIGHT TIBIAL PILON APPLICATION OF WOUND VAC;  Surgeon: Rozanna Box, MD;  Location: [place];  Service: Orthopedics;  Laterality: Right;  . Colonoscopy    . Hernia repair Bilateral [id number]    plus umbilicial-   . Orif tibia fracture Right 01/2014  . Cosmetic surgery Right     at [facility] 11/15/2013    left wrist  to right ankle   . Orif tibia fracture Right 01/19/2014    Procedure: OPEN REDUCTION INTERNAL FIXATION (ORIF) TIBIA/FIBULA FRACTURE REPAIR OF TIBIA/FIBULA NON UNION;  Surgeon: Rozanna Box, MD;  Location: [place];  Service: Orthopedics;  Laterality: Right;    There were no vitals filed for this visit.  Visit Diagnosis:  Abnormality of gait - Plan: PT plan of care cert/re-cert  Weakness generalized - Plan: PT plan of care cert/re-cert  Pain in joint, ankle and foot, right - Plan: PT plan of care cert/re-cert  Activity intolerance - Plan: PT plan of care cert/re-cert      Subjective Assessment - 04/19/14 1616    Subjective Pt presents to PT 6 months s/p Rt tibia pylon fracture (at ankle) with ORIF x 2, plastic surgery and extensive rehab.  Pt had a fall off of a ladder.     Pertinent History 9 days at [facility] External fixator for 12 weeks.  Rehab stay and then plastic surgery at [facility] due to non-healing.  Home 12/16/13.  Back in hospital 01/2014 for plate on Rt.  Pt was NWB until 03/10/14.   Limitations Standing;Walking   How long can you stand comfortably? 15 minutes max   How long can you walk comfortably? 20 minutes- 30 minutes with use of walker and slow pace   Diagnostic tests x-ray  to determine healing   Patient Stated Goals return to regular gait, wean from assistive device, reduce Rt foot pain   Currently in Pain? Yes   Pain Score 5    Pain Location Foot   Pain Orientation Right   Pain Type Chronic pain   Pain Onset More than a month ago   Pain Frequency Intermittent   Aggravating Factors  standing and walking, stretching the foot   Pain Relieving Factors ice, aspirin, non weight bearing   Multiple Pain Sites No            [facility] PT Assessment - 04/19/14 0001    Assessment   Medical Diagnosis Rt pilon fracture with ORIF   Onset Date 10/24/13   Next MD Visit May 05, 2014   Prior Therapy rehab followed by home health PT   Precautions   Precautions None    Precaution Comments MD says WBAT and no restrictions   Restrictions   Weight Bearing Restrictions No   Balance Screen   Has the patient fallen in the past 6 months No  off the ladder   Has the patient had a decrease in activity level because of a fear of falling?  No   Is the patient reluctant to leave their home because of a fear of falling?  No   Home Environment   Living Enviornment Private residence   Living Arrangements Alone   Type of [place] One level   [place] - 2 wheels;Cane - single point;Crutches;Shower seat   Prior Function   Level of Independence Independent with basic ADLs   Vocation Retired   Leisure walking for exercise prior to injury   Cognition   Overall Cognitive Status Within Functional Limits for tasks assessed   Observation/Other Assessments   Focus on Therapeutic Outcomes (FOTO)  53% limitation   Posture/Postural Control   Posture/Postural Control No significant limitations   ROM / Strength   AROM / PROM / Strength AROM;PROM;Strength   AROM   Overall AROM  Deficits   Overall AROM Comments Lt ankle AROM is WFLs   AROM Assessment Site Ankle   Right/Left Ankle Right   Right Ankle Dorsiflexion 8   Right Ankle Plantar Flexion 18   Right Ankle Inversion 11   Right Ankle Eversion 3   PROM   Overall PROM  Deficits   Overall PROM Comments No significant difference between AROM and PROM due to fixation   PROM Assessment Site Ankle   Strength   Overall Strength Deficits   Overall Strength Comments Hip and knee on Rt 4+/5 to 5/5   Strength Assessment Site Ankle   Right/Left Ankle Right   Right Ankle Dorsiflexion 3-/5   Right Ankle Inversion 3-/5   Right Ankle Eversion 2/5   Palpation   Palpation Skin graft is healing over Rt medial ankle.  Edema at mid malleolar Rt 11 inches, Lt 10 inches   Transfers   Transfers Sit to Stand   Sit to Stand 6: Modified independent (Device/Increase time);With  upper extremity assist   Ambulation/Gait   Ambulation/Gait Yes   Ambulation/Gait Assistance 6: Modified independent (Device/Increase time)   Ambulation Distance (Feet) 100 Feet   Assistive device Rolling walker   Gait Pattern Step-to pattern;Decreased step length - right;Decreased stance time - right   Ambulation Surface Level   Stairs No  [facility] Adult PT Treatment/Exercise - 07-May-2014 0001    Exercises   Exercises Knee/Hip;Ankle   Ankle Exercises: Aerobic   Stationary Bike Level 2 x 12 minutes  PT present to discuss goals with patient                  PT Short Term Goals - 07-May-2014 1653    PT SHORT TERM GOAL #1   Title be indepenent with initial HEP   Time 4   Period Weeks   Status New   PT SHORT TERM GOAL #2   Title wean from walker to use of cane for home distances > or = to 50% of the time   Time 4   Period Weeks   Status New   PT SHORT TERM GOAL #3   Title walk for 20-25 minutes without need to rest   Time 4   Period Weeks   Status New   PT SHORT TERM GOAL #4   Title walk with step-through gait pattern on level surface for all distances   Time 4   Period Weeks   Status New           PT Long Term Goals - 05/07/14 1608    PT LONG TERM GOAL #1   Title be independent in advanced HEP   Time 8   Period Weeks   Status New   PT LONG TERM GOAL #2   Title reduce FOTO to < or = to 43% limitation   Time 8   Period Weeks   Status New   PT LONG TERM GOAL #3   Title wean from walker to use of cane > or = to 50-75% of the time in the community   Time 8   Period Weeks   Status New   PT LONG TERM GOAL #4   Title walk for exercise > or= to 30 minutes with use of cane   Time 8   Period Weeks   Status New   PT LONG TERM GOAL #5   Title report < or = to 3/10 Rt foot pain with standing and walking long periods   Time 8   Period Weeks   Status New               Plan - 2014-05-07 1648    Clinical Impression Statement Pt  presents s/p Rt ankle fracture with ORIF, extensive surgery and skin grafting with gait abnormality, limited endurance and limited AROM.  Pt will benefit from gait training, strength and AROM progression.    Pt will benefit from skilled therapeutic intervention in order to improve on the following deficits Abnormal gait;Decreased range of motion;Difficulty walking;Impaired flexibility;Increased edema;Decreased endurance;Decreased activity tolerance;Pain;Decreased mobility;Decreased strength   Rehab Potential Good   PT Frequency 2x / week   PT Duration 8 weeks   PT Treatment/Interventions ADLs/Self Care Home Management;Therapeutic activities;Scar mobilization;Therapeutic exercise;Passive range of motion;Manual techniques;Gait training;Cryotherapy;Neuromuscular re-education;Electrical Stimulation;Other (comment)  Vasopneumatic device using Game Ready   PT Next Visit Plan Game ready, Flexibility for Rt ankle and plantar fascia, gait training, endurance training   Consulted and Agree with Plan of Care Patient          G-Codes - 05/07/14 1646    Functional Assessment Tool Used FOTO: 53% limitation   Functional Limitation Mobility: Walking and moving around   Mobility: Walking and Moving Around Current Status ([id number]) At least 40 percent but less than 60 percent impaired, limited or restricted   Mobility: Walking and Moving  Around Goal Status [phone]) At least 40 percent but less than 60 percent impaired, limited or restricted       Problem List Patient Active Problem List   Diagnosis Date Noted  . 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 Anxiety   . Depression   . Fracture of distal end of right tibia 01/19/2014  . GERD (gastroesophageal reflux disease) 10/30/2013  . Fall from ladder 10/30/2013  . Acute urinary retention 10/30/2013  . Open fracture of tibial plafond with fibula involvement 10/24/2013    Yue Flanigan,  PT 04/19/2014, 4:59 PM  [facility] [address] [place], [place], [address] Phone: [phone]   Fax:  [phone]

## 2014-04-22 ENCOUNTER — Ambulatory Visit: Payer: Medicare Other | Admitting: Physical Therapy

## 2014-04-27 ENCOUNTER — Encounter: Payer: Self-pay | Admitting: Physical Therapy

## 2014-04-27 ENCOUNTER — Ambulatory Visit: Payer: Medicare Other | Admitting: Physical Therapy

## 2014-04-27 DIAGNOSIS — K219 Gastro-esophageal reflux disease without esophagitis: Secondary | ICD-10-CM | POA: Diagnosis not present

## 2014-04-27 DIAGNOSIS — R6889 Other general symptoms and signs: Secondary | ICD-10-CM | POA: Diagnosis not present

## 2014-04-27 DIAGNOSIS — Z9889 Other specified postprocedural states: Secondary | ICD-10-CM | POA: Diagnosis not present

## 2014-04-27 DIAGNOSIS — M25571 Pain in right ankle and joints of right foot: Secondary | ICD-10-CM

## 2014-04-27 DIAGNOSIS — R531 Weakness: Secondary | ICD-10-CM | POA: Diagnosis not present

## 2014-04-27 DIAGNOSIS — R269 Unspecified abnormalities of gait and mobility: Secondary | ICD-10-CM

## 2014-04-27 DIAGNOSIS — S82871E Displaced pilon fracture of right tibia, subsequent encounter for open fracture type I or II with routine healing: Secondary | ICD-10-CM | POA: Diagnosis not present

## 2014-04-27 NOTE — Therapy (Signed)
[facility] [address], [place], [place], [address] Phone: [phone]   Fax:  [phone]  Physical Therapy Treatment  Patient Details  Name: Kevin Kim MRN: [id number] Date of Birth: September 28, 1945 Referring Provider:  Lajean Manes, MD  Encounter Date: 04/27/2014      PT End of Session - 04/27/14 1002    Visit Number 2   Number of Visits 10   Date for PT Re-Evaluation 06/14/14   PT Start Time 0930   PT Stop Time 2426   PT Time Calculation (min) 77 min   Activity Tolerance Patient tolerated treatment well   Behavior During Therapy [facility] for tasks assessed/performed      Past Medical History  Diagnosis Date  . GERD (gastroesophageal reflux disease)   . Irregular heartbeat     1980's.  . Pneumonia     last time 1993  . Anxiety     while in hospital  . Depression     Past Surgical History  Procedure Laterality Date  . I&d extremity Right 10/24/2013    Procedure: IRRIGATION AND DEBRIDEMENT OPEN RIGHT TIBIA/FIBULA FRACTURE;  Surgeon: Mauri Pole, MD;  Location: [place];  Service: Orthopedics;  Laterality: Right;  . External fixation leg Right 10/24/2013    Procedure: EXTERNAL FIXATION RIGHT LOWER LEG;  Surgeon: Mauri Pole, MD;  Location: [place];  Service: Orthopedics;  Laterality: Right;  . Closed reduction tibia Right 10/24/2013    Procedure: CLOSED REDUCTION TIBIA/FIBULA FRACTURE;  Surgeon: Mauri Pole, MD;  Location: [place];  Service: Orthopedics;  Laterality: Right;  . External fixation leg Right 10/27/2013    Procedure:   REVISION OF  EXTERNAL FIXATOR RIGHT LOWER LEG   ORIF  RIGHT TIBIAL PILON APPLICATION OF WOUND VAC;  Surgeon: Rozanna Box, MD;  Location: [place];  Service: Orthopedics;  Laterality: Right;  . Colonoscopy    . Hernia repair Bilateral [id number]    plus umbilicial-   . Orif tibia fracture Right 01/2014  . Cosmetic surgery Right     at [facility] 11/15/2013    left wrist to right  ankle   . Orif tibia fracture Right 01/19/2014    Procedure: OPEN REDUCTION INTERNAL FIXATION (ORIF) TIBIA/FIBULA FRACTURE REPAIR OF TIBIA/FIBULA NON UNION;  Surgeon: Rozanna Box, MD;  Location: [place];  Service: Orthopedics;  Laterality: Right;    There were no vitals filed for this visit.  Visit Diagnosis:  Abnormality of gait  Weakness generalized  Pain in joint, ankle and foot, right  Activity intolerance      Subjective Assessment - 04/27/14 0935    Subjective Pt presents to PT 6 months s/p Rt tibia pylon fracture (at ankle) with ORIF x 2, plastic surgery and extensive rehab.  Pt had a fall off of a ladder.     Limitations Standing;Walking   Currently in Pain? No/denies   Multiple Pain Sites No                         [facility] Adult PT Treatment/Exercise - 04/27/14 0001    Exercises   Exercises Ankle   Modalities   Modalities Cryotherapy   Cryotherapy   Number Minutes Cryotherapy 15 Minutes   Cryotherapy Location Ankle  Rt   Type of Cryotherapy Other (comment)  Game Ready   Ankle Exercises: Aerobic   Stationary Bike Level 2 x 12 minutes  Seat #17   Ankle Exercises: Standing  Rocker Board --   Rebounder 3 directions x 1 min each  with B UE support   Other Standing Ankle Exercises ambulation with RW focus on incr base of support and heel toe pattern  71feet x 3   Other Standing Ankle Exercises Stairs 2x10 stepping up with Rt LE using B UE for mod A   Ankle Exercises: Machines for Strengthening   Cybex Leg Press St8  70# 3 x 10with Both LE, 30# with Rt LE 3 x 10   Ankle Exercises: Seated   Other Seated Ankle Exercises Rockerboard DF/PF Inv/Ever    x 1 min each sitting elevated in chair                  PT Short Term Goals - 04/27/14 0939    PT SHORT TERM GOAL #1   Title be indepenent with initial HEP   Time 4   Period Weeks   Status On-going   PT SHORT TERM GOAL #2   Title wean from walker to use of cane for home distances > or  = to 50% of the time   Time 4   Period Weeks   Status On-going   PT SHORT TERM GOAL #3   Title walk for 20-25 minutes without need to rest   Time 4   Period Weeks   Status On-going   PT SHORT TERM GOAL #4   Title walk with step-through gait pattern on level surface for all distances   Time 4   Period Weeks   Status On-going           PT Long Term Goals - 04/27/14 0940    PT LONG TERM GOAL #1   Title be independent in advanced HEP   Time 8   Period Weeks   Status On-going   PT LONG TERM GOAL #2   Title reduce FOTO to < or = to 43% limitation   Time 8   Period Weeks   Status On-going   PT LONG TERM GOAL #3   Title wean from walker to use of cane > or = to 50-75% of the time in the community   Time 8   Period Weeks   Status On-going   PT LONG TERM GOAL #4   Title walk for exercise > or= to 30 minutes with use of cane   Time 8   Period Weeks   Status On-going   PT LONG TERM GOAL #5   Title report < or = to 3/10 Rt foot pain with standing and walking long periods   Time 8   Period Weeks   Status On-going               Plan - 04/27/14 1002    Clinical Impression Statement Pt after s/p Rt ankle fracture with ORIF and skin graft, ( 6 surgeries),  Pt tolerated therapeutic exercises incliuding weightbearing activities well, no pain    Pt will benefit from skilled therapeutic intervention in order to improve on the following deficits Abnormal gait;Decreased range of motion;Difficulty walking;Impaired flexibility;Increased edema;Decreased endurance;Decreased activity tolerance;Pain;Decreased mobility;Decreased strength   Rehab Potential Good   PT Frequency 2x / week   PT Duration 8 weeks   PT Treatment/Interventions ADLs/Self Care Home Management;Therapeutic activities;Scar mobilization;Therapeutic exercise;Passive range of motion;Manual techniques;Gait training;Cryotherapy;Neuromuscular re-education;Electrical Stimulation;Other (comment)   PT Next Visit Plan  Continue with weightbearing tolerance on right LE, improve gait pattern, endurance   Consulted and Agree with Plan of Care Patient  Problem List Patient Active Problem List   Diagnosis Date Noted  . 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 Anxiety   . Depression   . Fracture of distal end of right tibia 01/19/2014  . GERD (gastroesophageal reflux disease) 10/30/2013  . Fall from ladder 10/30/2013  . Acute urinary retention 10/30/2013  . Open fracture of tibial plafond with fibula involvement 10/24/2013    NAUMANN-HOUEGNIFIO,Wilhelmena Zea PTA 04/27/2014, 10:50 AM  [facility] [address] [place], [place], [address] Phone: [phone]   Fax:  [phone]

## 2014-04-28 DIAGNOSIS — J301 Allergic rhinitis due to pollen: Secondary | ICD-10-CM | POA: Diagnosis not present

## 2014-04-28 DIAGNOSIS — J3089 Other allergic rhinitis: Secondary | ICD-10-CM | POA: Diagnosis not present

## 2014-04-28 DIAGNOSIS — Z91010 Allergy to peanuts: Secondary | ICD-10-CM | POA: Diagnosis not present

## 2014-04-28 DIAGNOSIS — J3081 Allergic rhinitis due to animal (cat) (dog) hair and dander: Secondary | ICD-10-CM | POA: Diagnosis not present

## 2014-04-29 ENCOUNTER — Ambulatory Visit: Payer: Medicare Other

## 2014-04-29 DIAGNOSIS — R531 Weakness: Secondary | ICD-10-CM | POA: Diagnosis not present

## 2014-04-29 DIAGNOSIS — K219 Gastro-esophageal reflux disease without esophagitis: Secondary | ICD-10-CM | POA: Diagnosis not present

## 2014-04-29 DIAGNOSIS — R6889 Other general symptoms and signs: Secondary | ICD-10-CM

## 2014-04-29 DIAGNOSIS — Z9889 Other specified postprocedural states: Secondary | ICD-10-CM | POA: Diagnosis not present

## 2014-04-29 DIAGNOSIS — S82871E Displaced pilon fracture of right tibia, subsequent encounter for open fracture type I or II with routine healing: Secondary | ICD-10-CM | POA: Diagnosis not present

## 2014-04-29 DIAGNOSIS — M25571 Pain in right ankle and joints of right foot: Secondary | ICD-10-CM

## 2014-04-29 DIAGNOSIS — R269 Unspecified abnormalities of gait and mobility: Secondary | ICD-10-CM

## 2014-04-29 NOTE — Therapy (Signed)
[facility] [address] [place], [place], [address] Phone: [phone]   Fax:  [phone]  Physical Therapy Treatment  Patient Details  Name: Kevin Kim MRN: [id number] Date of Birth: [birth year] Referring Provider:  Altamese Turley, MD  Encounter Date: 04/29/2014      PT End of Session - 04/29/14 1443    Visit Number 3   Number of Visits 10  Medicare   Date for PT Re-Evaluation 06/14/14   PT Start Time 3557   PT Stop Time 1459   PT Time Calculation (min) 61 min   Activity Tolerance Patient tolerated treatment well   Behavior During Therapy [facility] for tasks assessed/performed      Past Medical History  Diagnosis Date  . GERD (gastroesophageal reflux disease)   . Irregular heartbeat     1980's.  . Pneumonia     last time 1993  . Anxiety     while in hospital  . Depression     Past Surgical History  Procedure Laterality Date  . I&d extremity Right 10/24/2013    Procedure: IRRIGATION AND DEBRIDEMENT OPEN RIGHT TIBIA/FIBULA FRACTURE;  Surgeon: Mauri Pole, MD;  Location: [place];  Service: Orthopedics;  Laterality: Right;  . External fixation leg Right 10/24/2013    Procedure: EXTERNAL FIXATION RIGHT LOWER LEG;  Surgeon: Mauri Pole, MD;  Location: [place];  Service: Orthopedics;  Laterality: Right;  . Closed reduction tibia Right 10/24/2013    Procedure: CLOSED REDUCTION TIBIA/FIBULA FRACTURE;  Surgeon: Mauri Pole, MD;  Location: [place];  Service: Orthopedics;  Laterality: Right;  . External fixation leg Right 10/27/2013    Procedure:   REVISION OF  EXTERNAL FIXATOR RIGHT LOWER LEG   ORIF  RIGHT TIBIAL PILON APPLICATION OF WOUND VAC;  Surgeon: Rozanna Box, MD;  Location: [place];  Service: Orthopedics;  Laterality: Right;  . Colonoscopy    . Hernia repair Bilateral [id number]    plus umbilicial-   . Orif tibia fracture Right 01/2014  . Cosmetic surgery Right     at [facility] 11/15/2013    left wrist  to right ankle   . Orif tibia fracture Right 01/19/2014    Procedure: OPEN REDUCTION INTERNAL FIXATION (ORIF) TIBIA/FIBULA FRACTURE REPAIR OF TIBIA/FIBULA NON UNION;  Surgeon: Rozanna Box, MD;  Location: [place];  Service: Orthopedics;  Laterality: Right;    There were no vitals filed for this visit.  Visit Diagnosis:  Abnormality of gait  Weakness generalized  Pain in joint, ankle and foot, right  Activity intolerance      Subjective Assessment - 04/29/14 1401    Subjective Pt reports that he was tired yesterday.  Game Ready helped to reduce the edema in the Rt ankle.     Pertinent History 9 days at [facility]. External fixator for 12 weeks.  Rehab stay and then plastic surgery at [facility] due to non-healing.  Home 12/16/13.  Back in hospital 01/2014 for plate on Rt.  Pt was [facility] until 03/10/14.   Currently in Pain? No/denies   Pain Location Foot   Pain Orientation Right                         [facility] Adult PT Treatment/Exercise - 04/29/14 0001    Ambulation/Gait   Gait Comments Gait training: gait belt and single point cane. 4x25 feet with verbal cues for step through gait pattern and symmetry with ambulation  Cryotherapy   Number Minutes Cryotherapy 15 Minutes   Cryotherapy Location Ankle  Rt   Type of Cryotherapy Other (comment)  Game Ready: 3 snowflakes, med compression x 15 minutes   Ankle Exercises: Aerobic   Stationary Bike Level 2 x 10 minutes  Seat #17   Ankle Exercises: Standing   Rebounder 3 directions x 1 min each  mini trampoline: with B UE support   Other Standing Ankle Exercises Stairs 2x10 stepping up with Rt LE using B UE for mod A  6" step   Ankle Exercises: Machines for Strengthening   Cybex Leg Press St8  70# 3 x 10with Both LE, 30# with Rt LE 3 x 10   Ankle Exercises: Seated   Other Seated Ankle Exercises Rockerboard DF/PF Inv/Ever    x 1 min each sitting elevated in chair                  PT Short Term Goals - 04/27/14  0939    PT SHORT TERM GOAL #1   Title be indepenent with initial HEP   Time 4   Period Weeks   Status On-going   PT SHORT TERM GOAL #2   Title wean from walker to use of cane for home distances > or = to 50% of the time   Time 4   Period Weeks   Status On-going   PT SHORT TERM GOAL #3   Title walk for 20-25 minutes without need to rest   Time 4   Period Weeks   Status On-going   PT SHORT TERM GOAL #4   Title walk with step-through gait pattern on level surface for all distances   Time 4   Period Weeks   Status On-going           PT Long Term Goals - 04/27/14 0940    PT LONG TERM GOAL #1   Title be independent in advanced HEP   Time 8   Period Weeks   Status On-going   PT LONG TERM GOAL #2   Title reduce FOTO to < or = to 43% limitation   Time 8   Period Weeks   Status On-going   PT LONG TERM GOAL #3   Title wean from walker to use of cane > or = to 50-75% of the time in the community   Time 8   Period Weeks   Status On-going   PT LONG TERM GOAL #4   Title walk for exercise > or= to 30 minutes with use of cane   Time 8   Period Weeks   Status On-going   PT LONG TERM GOAL #5   Title report < or = to 3/10 Rt foot pain with standing and walking long periods   Time 8   Period Weeks   Status On-going               Plan - 04/29/14 1433    Clinical Impression Statement Pt with continued gait abnormality, weakness and limited AROM s/p ORIF of Rt ankle.  Pt will benefit from contiued gait training, strength and prioprioception training to wean from assistive device safely.     Pt will benefit from skilled therapeutic intervention in order to improve on the following deficits Abnormal gait;Decreased range of motion;Difficulty walking;Impaired flexibility;Increased edema;Decreased endurance;Decreased activity tolerance;Pain;Decreased mobility;Decreased strength   Rehab Potential Good   PT Frequency 2x / week   PT Duration 8 weeks   PT Treatment/Interventions  ADLs/Self Care  Home Management;Therapeutic activities;Scar mobilization;Therapeutic exercise;Passive range of motion;Manual techniques;Gait training;Cryotherapy;Neuromuscular re-education;Electrical Stimulation;Other (comment)   PT Next Visit Plan Continue with weightbearing tolerance on right LE, improve gait pattern, endurance        Problem List Patient Active Problem List   Diagnosis Date Noted  . 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 Anxiety   . Depression   . Fracture of distal end of right tibia 01/19/2014  . GERD (gastroesophageal reflux disease) 10/30/2013  . Fall from ladder 10/30/2013  . Acute urinary retention 10/30/2013  . Open fracture of tibial plafond with fibula involvement 10/24/2013    Juniper Cobey, PT 04/29/2014, 2:48 PM  [facility] [address] [place], [place], [address] Phone: [phone]   Fax:  [phone]

## 2014-05-04 ENCOUNTER — Ambulatory Visit: Payer: Medicare Other | Attending: Orthopedic Surgery

## 2014-05-04 DIAGNOSIS — R269 Unspecified abnormalities of gait and mobility: Secondary | ICD-10-CM

## 2014-05-04 DIAGNOSIS — K219 Gastro-esophageal reflux disease without esophagitis: Secondary | ICD-10-CM | POA: Diagnosis not present

## 2014-05-04 DIAGNOSIS — R531 Weakness: Secondary | ICD-10-CM | POA: Diagnosis not present

## 2014-05-04 DIAGNOSIS — Z9889 Other specified postprocedural states: Secondary | ICD-10-CM | POA: Diagnosis not present

## 2014-05-04 DIAGNOSIS — S82871E Displaced pilon fracture of right tibia, subsequent encounter for open fracture type I or II with routine healing: Secondary | ICD-10-CM | POA: Diagnosis not present

## 2014-05-04 DIAGNOSIS — R6889 Other general symptoms and signs: Secondary | ICD-10-CM | POA: Diagnosis not present

## 2014-05-04 DIAGNOSIS — M25571 Pain in right ankle and joints of right foot: Secondary | ICD-10-CM

## 2014-05-04 NOTE — Therapy (Signed)
[facility] [address], [place], [place], [address] Phone: [phone]   Fax:  [phone]  Physical Therapy Treatment  Patient Details  Name: Kevin Kim MRN: [id number] Date of Birth: Feb 01, 1945 Referring Provider:  Lajean Manes, MD  Encounter Date: 05/04/2014      PT End of Session - 05/04/14 1220    Visit Number 4   Number of Visits 10  Medicare   Date for PT Re-Evaluation 06/14/14   PT Start Time 6387   PT Stop Time 1240   PT Time Calculation (min) 56 min   Activity Tolerance Patient tolerated treatment well   Behavior During Therapy [facility] for tasks assessed/performed      Past Medical History  Diagnosis Date  . GERD (gastroesophageal reflux disease)   . Irregular heartbeat     1980's.  . Pneumonia     last time 1993  . Anxiety     while in hospital  . Depression     Past Surgical History  Procedure Laterality Date  . I&d extremity Right 10/24/2013    Procedure: IRRIGATION AND DEBRIDEMENT OPEN RIGHT TIBIA/FIBULA FRACTURE;  Surgeon: Mauri Pole, MD;  Location: [place];  Service: Orthopedics;  Laterality: Right;  . External fixation leg Right 10/24/2013    Procedure: EXTERNAL FIXATION RIGHT LOWER LEG;  Surgeon: Mauri Pole, MD;  Location: [place];  Service: Orthopedics;  Laterality: Right;  . Closed reduction tibia Right 10/24/2013    Procedure: CLOSED REDUCTION TIBIA/FIBULA FRACTURE;  Surgeon: Mauri Pole, MD;  Location: [place];  Service: Orthopedics;  Laterality: Right;  . External fixation leg Right 10/27/2013    Procedure:   REVISION OF  EXTERNAL FIXATOR RIGHT LOWER LEG   ORIF  RIGHT TIBIAL PILON APPLICATION OF WOUND VAC;  Surgeon: Rozanna Box, MD;  Location: [place];  Service: Orthopedics;  Laterality: Right;  . Colonoscopy    . Hernia repair Bilateral [id number]    plus umbilicial-   . Orif tibia fracture Right 01/2014  . Cosmetic surgery Right     at [facility] 11/15/2013    left wrist  to right ankle   . Orif tibia fracture Right 01/19/2014    Procedure: OPEN REDUCTION INTERNAL FIXATION (ORIF) TIBIA/FIBULA FRACTURE REPAIR OF TIBIA/FIBULA NON UNION;  Surgeon: Rozanna Box, MD;  Location: [place];  Service: Orthopedics;  Laterality: Right;    There were no vitals filed for this visit.  Visit Diagnosis:  Abnormality of gait  Weakness generalized  Pain in joint, ankle and foot, right  Activity intolerance      Subjective Assessment - 05/04/14 1153    Subjective Pt with Achilles discomfort/stiffness on the Rt.    Currently in Pain? No/denies                         [facility] Adult PT Treatment/Exercise - 05/04/14 0001    Ambulation/Gait   Gait Comments Gait training: gait belt and single point cane. 8x25 feet with verbal cues for step through gait pattern and symmetry with ambulation   Cryotherapy   Number Minutes Cryotherapy 15 Minutes   Cryotherapy Location Ankle  Rt   Type of Cryotherapy Other (comment)  Game Ready: 3 snowflakes, medium compression   Ankle Exercises: Aerobic   Stationary Bike Level 2 x 10 minutes   Ankle Exercises: Standing   Rocker Board 2 minutes  PF/DF and inversion/eversion x 2 minutes each   Rebounder 3 directions  x 1 min each  mini trampoline: with B UE support   Ankle Exercises: Stretches   Gastroc Stretch 20 seconds;3 reps  using edge of the step   Ankle Exercises: Machines for Strengthening   Cybex Leg Press St8  75# 3 x 20 with Both LE, 35# with Rt LE 3 x 10                  PT Short Term Goals - 05/04/14 1153    PT SHORT TERM GOAL #1   Title be indepenent with initial HEP   Time 4   Period Weeks   Status Achieved   PT SHORT TERM GOAL #2   Title wean from walker to use of cane for home distances > or = to 50% of the time   Time 4   Period Weeks   Status On-going  20% use of cane at home   PT SHORT TERM GOAL #3   Title walk for 20-25 minutes without need to rest   Time 4   Period Weeks    Status --  5-10 minutes   PT SHORT TERM GOAL #4   Title walk with step-through gait pattern on level surface for all distances   Time 4   Period Weeks   Status On-going  step-through 50% of the time           PT Long Term Goals - 04/27/14 0940    PT LONG TERM GOAL #1   Title be independent in advanced HEP   Time 8   Period Weeks   Status On-going   PT LONG TERM GOAL #2   Title reduce FOTO to < or = to 43% limitation   Time 8   Period Weeks   Status On-going   PT LONG TERM GOAL #3   Title wean from walker to use of cane > or = to 50-75% of the time in the community   Time 8   Period Weeks   Status On-going   PT LONG TERM GOAL #4   Title walk for exercise > or= to 30 minutes with use of cane   Time 8   Period Weeks   Status On-going   PT LONG TERM GOAL #5   Title report < or = to 3/10 Rt foot pain with standing and walking long periods   Time 8   Period Weeks   Status On-going               Plan - 05/04/14 1157    Clinical Impression Statement Pt with continued gait abnormality, weakness and limited AROM s/p ORIF of Rt ankle.  Pt with improved gait pattern with use of cane in the clinic today.  See goals for status update.   Pt will benefit from skilled therapeutic intervention in order to improve on the following deficits Abnormal gait;Decreased range of motion;Difficulty walking;Impaired flexibility;Increased edema;Decreased endurance;Decreased activity tolerance;Pain;Decreased mobility;Decreased strength   Rehab Potential Good   PT Frequency 2x / week   PT Duration 8 weeks   PT Treatment/Interventions ADLs/Self Care Home Management;Therapeutic activities;Scar mobilization;Therapeutic exercise;Passive range of motion;Manual techniques;Gait training;Cryotherapy;Neuromuscular re-education;Electrical Stimulation;Other (comment)   Consulted and Agree with Plan of Care Patient        Problem List Patient Active Problem List   Diagnosis Date Noted  . Type I  or II open intra-articular fracture of distal end of right tibia with nonunion 01/21/2014  . Anxiety   . Depression   . Fracture of  distal end of right tibia 01/19/2014  . GERD (gastroesophageal reflux disease) 10/30/2013  . Fall from ladder 10/30/2013  . Acute urinary retention 10/30/2013  . Open fracture of tibial plafond with fibula involvement 10/24/2013    Ameenah Prosser, PT 05/04/2014, 12:25 PM  [facility] [address] [place], [place], [address] Phone: [phone]   Fax:  [phone]

## 2014-05-05 DIAGNOSIS — S82391N Other fracture of lower end of right tibia, subsequent encounter for open fracture type IIIA, IIIB, or IIIC with nonunion: Secondary | ICD-10-CM | POA: Diagnosis not present

## 2014-05-05 DIAGNOSIS — S82391F Other fracture of lower end of right tibia, subsequent encounter for open fracture type IIIA, IIIB, or IIIC with routine healing: Secondary | ICD-10-CM | POA: Diagnosis not present

## 2014-05-05 DIAGNOSIS — M722 Plantar fascial fibromatosis: Secondary | ICD-10-CM | POA: Diagnosis not present

## 2014-05-06 ENCOUNTER — Ambulatory Visit: Payer: Medicare Other

## 2014-05-06 DIAGNOSIS — R6889 Other general symptoms and signs: Secondary | ICD-10-CM

## 2014-05-06 DIAGNOSIS — S82871E Displaced pilon fracture of right tibia, subsequent encounter for open fracture type I or II with routine healing: Secondary | ICD-10-CM | POA: Diagnosis not present

## 2014-05-06 DIAGNOSIS — K219 Gastro-esophageal reflux disease without esophagitis: Secondary | ICD-10-CM | POA: Diagnosis not present

## 2014-05-06 DIAGNOSIS — Z9889 Other specified postprocedural states: Secondary | ICD-10-CM | POA: Diagnosis not present

## 2014-05-06 DIAGNOSIS — R531 Weakness: Secondary | ICD-10-CM | POA: Diagnosis not present

## 2014-05-06 DIAGNOSIS — M25571 Pain in right ankle and joints of right foot: Secondary | ICD-10-CM

## 2014-05-06 DIAGNOSIS — R269 Unspecified abnormalities of gait and mobility: Secondary | ICD-10-CM

## 2014-05-06 DIAGNOSIS — M7989 Other specified soft tissue disorders: Secondary | ICD-10-CM

## 2014-05-06 NOTE — Therapy (Signed)
[facility] [address], [place], [place], [address] Phone: [phone]   Fax:  [phone]  Physical Therapy Treatment  Patient Details  Name: Kevin Kim MRN: [id number] Date of Birth: [birth year] Referring Provider:  Lajean Manes, MD  Encounter Date: 05/06/2014      PT End of Session - 05/06/14 1305    Visit Number 5   Number of Visits 10  Medicare   Date for PT Re-Evaluation 06/14/14   PT Start Time 1225   PT Stop Time 1320   PT Time Calculation (min) 55 min   Activity Tolerance Patient tolerated treatment well   Behavior During Therapy [facility] for tasks assessed/performed      Past Medical History  Diagnosis Date  . GERD (gastroesophageal reflux disease)   . Irregular heartbeat     1980's.  . Pneumonia     last time 1993  . Anxiety     while in hospital  . Depression     Past Surgical History  Procedure Laterality Date  . I&d extremity Right 10/24/2013    Procedure: IRRIGATION AND DEBRIDEMENT OPEN RIGHT TIBIA/FIBULA FRACTURE;  Surgeon: Mauri Pole, MD;  Location: [place];  Service: Orthopedics;  Laterality: Right;  . External fixation leg Right 10/24/2013    Procedure: EXTERNAL FIXATION RIGHT LOWER LEG;  Surgeon: Mauri Pole, MD;  Location: [place];  Service: Orthopedics;  Laterality: Right;  . Closed reduction tibia Right 10/24/2013    Procedure: CLOSED REDUCTION TIBIA/FIBULA FRACTURE;  Surgeon: Mauri Pole, MD;  Location: [place];  Service: Orthopedics;  Laterality: Right;  . External fixation leg Right 10/27/2013    Procedure:   REVISION OF  EXTERNAL FIXATOR RIGHT LOWER LEG   ORIF  RIGHT TIBIAL PILON APPLICATION OF WOUND VAC;  Surgeon: Rozanna Box, MD;  Location: [place];  Service: Orthopedics;  Laterality: Right;  . Colonoscopy    . Hernia repair Bilateral [id number]    plus umbilicial-   . Orif tibia fracture Right 01/2014  . Cosmetic surgery Right     at [facility] 11/15/2013    left wrist  to right ankle   . Orif tibia fracture Right 01/19/2014    Procedure: OPEN REDUCTION INTERNAL FIXATION (ORIF) TIBIA/FIBULA FRACTURE REPAIR OF TIBIA/FIBULA NON UNION;  Surgeon: Rozanna Box, MD;  Location: [place];  Service: Orthopedics;  Laterality: Right;    There were no vitals filed for this visit.  Visit Diagnosis:  Abnormality of gait  Weakness generalized  Pain in joint, ankle and foot, right  Activity intolerance  Swelling of limb      Subjective Assessment - 05/06/14 1233    Subjective Pt saw MD yesterday and he said that all was doing well.     Currently in Pain? No/denies                         [facility] Adult PT Treatment/Exercise - 05/06/14 0001    Ambulation/Gait   Gait Comments Gait training: gait belt and single point cane. 2x25 feet with verbal cues for step through gait pattern and symmetry with ambulation   Modalities   Modalities Cryotherapy   Cryotherapy   Number Minutes Cryotherapy 15 Minutes   Cryotherapy Location Ankle  Rt   Type of Cryotherapy Other (comment)  Game Ready: 3 snowflakes, medium compression    Ankle Exercises: Aerobic   Stationary Bike Level 2 x 10 minutes   Ankle Exercises: Standing  Rocker Board 2 minutes  PF/DF and inversion/eversion x 2 minutes each   Rebounder 3 directions x 1 min each  mini trampoline: with B UE support   Heel Raises 20 reps  verbal cues to reduce substitution   Other Standing Ankle Exercises weight shifting onto the Rt LE   Ankle Exercises: Stretches   Gastroc Stretch 20 seconds;3 reps  using edge of the step   Ankle Exercises: Machines for Strengthening   Cybex Leg Press St8  80# 3 x 20 with Both LE, 35# with Rt LE 3 x 10                  PT Short Term Goals - 05/04/14 1153    PT SHORT TERM GOAL #1   Title be indepenent with initial HEP   Time 4   Period Weeks   Status Achieved   PT SHORT TERM GOAL #2   Title wean from walker to use of cane for home distances > or = to  50% of the time   Time 4   Period Weeks   Status On-going  20% use of cane at home   PT SHORT TERM GOAL #3   Title walk for 20-25 minutes without need to rest   Time 4   Period Weeks   Status --  5-10 minutes   PT SHORT TERM GOAL #4   Title walk with step-through gait pattern on level surface for all distances   Time 4   Period Weeks   Status On-going  step-through 50% of the time           PT Long Term Goals - 04/27/14 0940    PT LONG TERM GOAL #1   Title be independent in advanced HEP   Time 8   Period Weeks   Status On-going   PT LONG TERM GOAL #2   Title reduce FOTO to < or = to 43% limitation   Time 8   Period Weeks   Status On-going   PT LONG TERM GOAL #3   Title wean from walker to use of cane > or = to 50-75% of the time in the community   Time 8   Period Weeks   Status On-going   PT LONG TERM GOAL #4   Title walk for exercise > or= to 30 minutes with use of cane   Time 8   Period Weeks   Status On-going   PT LONG TERM GOAL #5   Title report < or = to 3/10 Rt foot pain with standing and walking long periods   Time 8   Period Weeks   Status On-going               Plan - 05/06/14 1238    Clinical Impression Statement Pt with improved gait with use of cane.  Pt with continued weakness and limited Rt ankle AROM.     Pt will benefit from skilled therapeutic intervention in order to improve on the following deficits Abnormal gait;Decreased range of motion;Difficulty walking;Impaired flexibility;Increased edema;Decreased endurance;Decreased activity tolerance;Pain;Decreased mobility;Decreased strength   PT Frequency 2x / week   PT Duration 8 weeks   PT Treatment/Interventions ADLs/Self Care Home Management;Therapeutic activities;Scar mobilization;Therapeutic exercise;Passive range of motion;Manual techniques;Gait training;Cryotherapy;Neuromuscular re-education;Electrical Stimulation;Other (comment)   PT Next Visit Plan Continue with weightbearing  tolerance on right LE, improve gait pattern, endurance   Consulted and Agree with Plan of Care Patient        Problem List  Patient Active Problem List   Diagnosis Date Noted  . 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 Anxiety   . Depression   . Fracture of distal end of right tibia 01/19/2014  . GERD (gastroesophageal reflux disease) 10/30/2013  . Fall from ladder 10/30/2013  . Acute urinary retention 10/30/2013  . Open fracture of tibial plafond with fibula involvement 10/24/2013    TAKACS,KELLY, PT 05/06/2014, 1:07 PM  [facility] [address], [place], [place], [address] Phone: [phone]   Fax:  [phone]

## 2014-05-11 ENCOUNTER — Ambulatory Visit: Payer: Medicare Other | Admitting: Physical Therapy

## 2014-05-11 ENCOUNTER — Encounter: Payer: Self-pay | Admitting: Physical Therapy

## 2014-05-11 DIAGNOSIS — R6889 Other general symptoms and signs: Secondary | ICD-10-CM | POA: Diagnosis not present

## 2014-05-11 DIAGNOSIS — R269 Unspecified abnormalities of gait and mobility: Secondary | ICD-10-CM

## 2014-05-11 DIAGNOSIS — R531 Weakness: Secondary | ICD-10-CM | POA: Diagnosis not present

## 2014-05-11 DIAGNOSIS — Z9889 Other specified postprocedural states: Secondary | ICD-10-CM | POA: Diagnosis not present

## 2014-05-11 DIAGNOSIS — K219 Gastro-esophageal reflux disease without esophagitis: Secondary | ICD-10-CM | POA: Diagnosis not present

## 2014-05-11 DIAGNOSIS — S82871E Displaced pilon fracture of right tibia, subsequent encounter for open fracture type I or II with routine healing: Secondary | ICD-10-CM | POA: Diagnosis not present

## 2014-05-11 DIAGNOSIS — M7989 Other specified soft tissue disorders: Secondary | ICD-10-CM

## 2014-05-11 NOTE — Therapy (Signed)
[facility] [address] [place], [place], [address] Phone: [phone]   Fax:  [phone]  Physical Therapy Treatment  Patient Details  Name: Kevin Kim MRN: [id number] Date of Birth: 06/21/1945 Referring Provider:  Lajean Manes, MD  Encounter Date: 05/11/2014      PT End of Session - 05/11/14 1235    Visit Number 6   Number of Visits 10   Date for PT Re-Evaluation 06/14/14   PT Start Time 8099   PT Stop Time 1246   PT Time Calculation (min) 63 min   Activity Tolerance Patient tolerated treatment well   Behavior During Therapy [facility] for tasks assessed/performed      Past Medical History  Diagnosis Date  . GERD (gastroesophageal reflux disease)   . Irregular heartbeat     1980's.  . Pneumonia     last time 1993  . Anxiety     while in hospital  . Depression     Past Surgical History  Procedure Laterality Date  . I&d extremity Right 10/24/2013    Procedure: IRRIGATION AND DEBRIDEMENT OPEN RIGHT TIBIA/FIBULA FRACTURE;  Surgeon: Mauri Pole, MD;  Location: [place];  Service: Orthopedics;  Laterality: Right;  . External fixation leg Right 10/24/2013    Procedure: EXTERNAL FIXATION RIGHT LOWER LEG;  Surgeon: Mauri Pole, MD;  Location: [place];  Service: Orthopedics;  Laterality: Right;  . Closed reduction tibia Right 10/24/2013    Procedure: CLOSED REDUCTION TIBIA/FIBULA FRACTURE;  Surgeon: Mauri Pole, MD;  Location: [place];  Service: Orthopedics;  Laterality: Right;  . External fixation leg Right 10/27/2013    Procedure:   REVISION OF  EXTERNAL FIXATOR RIGHT LOWER LEG   ORIF  RIGHT TIBIAL PILON APPLICATION OF WOUND VAC;  Surgeon: Rozanna Box, MD;  Location: [place];  Service: Orthopedics;  Laterality: Right;  . Colonoscopy    . Hernia repair Bilateral [id number]    plus umbilicial-   . Orif tibia fracture Right 01/2014  . Cosmetic surgery Right     at [facility] 11/15/2013    left wrist to right  ankle   . Orif tibia fracture Right 01/19/2014    Procedure: OPEN REDUCTION INTERNAL FIXATION (ORIF) TIBIA/FIBULA FRACTURE REPAIR OF TIBIA/FIBULA NON UNION;  Surgeon: Rozanna Box, MD;  Location: [place];  Service: Orthopedics;  Laterality: Right;    There were no vitals filed for this visit.  Visit Diagnosis:  Abnormality of gait  Weakness generalized  Activity intolerance  Swelling of limb      Subjective Assessment - 05/11/14 1228    Subjective Pt is performing well in therapy and tolerates weightbearing activities well   Currently in Pain? No/denies   Multiple Pain Sites No                         [facility] Adult PT Treatment/Exercise - 05/11/14 0001    Ambulation/Gait   Gait Comments gait training with single point cane 158feet to built conficdence with SPC   Knee/Hip Exercises: Standing   Forward Step Up Right;3 sets;10 reps;Hand Hold: 2  pt need vc's for sequencing   Modalities   Modalities Cryotherapy   Cryotherapy   Number Minutes Cryotherapy 15 Minutes   Cryotherapy Location Ankle  Rt   Type of Cryotherapy Other (comment)  Game Ready, 3 snowflakes, med compression   Ankle Exercises: Aerobic   Stationary Bike Level 2 x 77minutes   Ankle Exercises:  Standing   Rocker Board 2 minutes   Rebounder 3 directions x 1 min each   Heel Raises 20 reps   Other Standing Ankle Exercises weight shifting onto the Rt LE   Ankle Exercises: Machines for Strengthening   Cybex Leg Press St8  80# 3 x 20 with Both LE, 35# with Rt LE 3 x 10                  PT Short Term Goals - 05/11/14 1419    PT SHORT TERM GOAL #1   Title be indepenent with initial HEP   Time 4   Period Weeks   Status Achieved   PT SHORT TERM GOAL #2   Title wean from walker to use of cane for home distances > or = to 50% of the time   Time 4   Period Weeks   Status On-going   PT SHORT TERM GOAL #3   Title walk for 20-25 minutes without need to rest   Time 4   Period Weeks    Status On-going   PT SHORT TERM GOAL #4   Title walk with step-through gait pattern on level surface for all distances   Time 4   Period Weeks   Status On-going           PT Long Term Goals - 05/11/14 1420    PT LONG TERM GOAL #1   Title be independent in advanced HEP   Time 8   Period Weeks   Status On-going   PT LONG TERM GOAL #2   Title reduce FOTO to < or = to 43% limitation   Time 8   Period Weeks   Status On-going   PT LONG TERM GOAL #3   Title wean from walker to use of cane > or = to 50-75% of the time in the community   Time 8   Period Weeks   Status On-going   PT LONG TERM GOAL #4   Title walk for exercise > or= to 30 minutes with use of cane   Time 8   Period Weeks   Status On-going   PT LONG TERM GOAL #5   Title report < or = to 3/10 Rt foot pain with standing and walking long periods   Time 8   Period Weeks   Status On-going               Plan - 05/11/14 1417    Clinical Impression Statement Pt continues to improve with conficdence with single point cane ambulation in PT clinic. Still avoids weightshift to Rt LE   Pt will benefit from skilled therapeutic intervention in order to improve on the following deficits Abnormal gait;Decreased range of motion;Difficulty walking;Impaired flexibility;Increased edema;Decreased endurance;Decreased activity tolerance;Pain;Decreased mobility;Decreased strength   Rehab Potential Good   PT Frequency 2x / week   PT Duration 8 weeks   PT Treatment/Interventions ADLs/Self Care Home Management;Therapeutic activities;Scar mobilization;Therapeutic exercise;Passive range of motion;Manual techniques;Gait training;Cryotherapy;Neuromuscular re-education;Electrical Stimulation;Other (comment)   PT Next Visit Plan Continue with weightbearing tolerance on right LE, improve gait pattern, endurance   Consulted and Agree with Plan of Care Patient        Problem List Patient Active Problem List   Diagnosis Date Noted  .  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 Anxiety   . Depression   . Fracture of distal end of right tibia 01/19/2014  .  GERD (gastroesophageal reflux disease) 10/30/2013  . Fall from ladder 10/30/2013  . Acute urinary retention 10/30/2013  . Open fracture of tibial plafond with fibula involvement 10/24/2013    NAUMANN-HOUEGNIFIO,Aneta Hendershott PTA 05/11/2014, 2:22 PM  [facility] [address] [place], [place], [address] Phone: [phone]   Fax:  [phone]

## 2014-05-13 ENCOUNTER — Ambulatory Visit: Payer: Medicare Other | Admitting: Physical Therapy

## 2014-05-13 ENCOUNTER — Encounter: Payer: Self-pay | Admitting: Physical Therapy

## 2014-05-13 DIAGNOSIS — Z9889 Other specified postprocedural states: Secondary | ICD-10-CM | POA: Diagnosis not present

## 2014-05-13 DIAGNOSIS — R269 Unspecified abnormalities of gait and mobility: Secondary | ICD-10-CM

## 2014-05-13 DIAGNOSIS — R531 Weakness: Secondary | ICD-10-CM | POA: Diagnosis not present

## 2014-05-13 DIAGNOSIS — S82871E Displaced pilon fracture of right tibia, subsequent encounter for open fracture type I or II with routine healing: Secondary | ICD-10-CM | POA: Diagnosis not present

## 2014-05-13 DIAGNOSIS — K219 Gastro-esophageal reflux disease without esophagitis: Secondary | ICD-10-CM | POA: Diagnosis not present

## 2014-05-13 DIAGNOSIS — R6889 Other general symptoms and signs: Secondary | ICD-10-CM | POA: Diagnosis not present

## 2014-05-13 DIAGNOSIS — M7989 Other specified soft tissue disorders: Secondary | ICD-10-CM

## 2014-05-13 NOTE — Therapy (Signed)
[facility] [address], [place], [place], [address] Phone: [phone]   Fax:  [phone]  Physical Therapy Treatment  Patient Details  Name: Kevin Kim MRN: [id number] Date of Birth: 06/21/1945 Referring Provider:  Lajean Manes, MD  Encounter Date: 05/13/2014    Past Medical History  Diagnosis Date  . GERD (gastroesophageal reflux disease)   . Irregular heartbeat     1980's.  . Pneumonia     last time 1993  . Anxiety     while in hospital  . Depression     Past Surgical History  Procedure Laterality Date  . I&d extremity Right 10/24/2013    Procedure: IRRIGATION AND DEBRIDEMENT OPEN RIGHT TIBIA/FIBULA FRACTURE;  Surgeon: Mauri Pole, MD;  Location: [place];  Service: Orthopedics;  Laterality: Right;  . External fixation leg Right 10/24/2013    Procedure: EXTERNAL FIXATION RIGHT LOWER LEG;  Surgeon: Mauri Pole, MD;  Location: [place];  Service: Orthopedics;  Laterality: Right;  . Closed reduction tibia Right 10/24/2013    Procedure: CLOSED REDUCTION TIBIA/FIBULA FRACTURE;  Surgeon: Mauri Pole, MD;  Location: [place];  Service: Orthopedics;  Laterality: Right;  . External fixation leg Right 10/27/2013    Procedure:   REVISION OF  EXTERNAL FIXATOR RIGHT LOWER LEG   ORIF  RIGHT TIBIAL PILON APPLICATION OF WOUND VAC;  Surgeon: Rozanna Box, MD;  Location: [place];  Service: Orthopedics;  Laterality: Right;  . Colonoscopy    . Hernia repair Bilateral [id number]    plus umbilicial-   . Orif tibia fracture Right 01/2014  . Cosmetic surgery Right     at [facility] 11/15/2013    left wrist to right ankle   . Orif tibia fracture Right 01/19/2014    Procedure: OPEN REDUCTION INTERNAL FIXATION (ORIF) TIBIA/FIBULA FRACTURE REPAIR OF TIBIA/FIBULA NON UNION;  Surgeon: Rozanna Box, MD;  Location: [place];  Service: Orthopedics;  Laterality: Right;    There were no vitals filed for this visit.  Visit Diagnosis:   Abnormality of gait  Weakness generalized  Activity intolerance  Swelling of limb      Subjective Assessment - 05/13/14 1154    Subjective Pt arrived with single point cane    How long can you walk comfortably? 30 - 40 min while at [facility], but using shopping chart for Assist   Currently in Pain? No/denies   Multiple Pain Sites No                         [facility] Adult PT Treatment/Exercise - 05/13/14 0001    Ambulation/Gait   Gait Comments gait training with SPC 120x60feet to built conficdence and improve gait pattern  turning around dark tiles    Knee/Hip Exercises: Standing   Lateral Step Up Right;3 sets;Hand Hold: 2   Forward Step Up Right;3 sets;10 reps;Hand Hold: 2   Other Standing Knee Exercises tapping up to cone and stepping over cone 3x10 each with Rt LE   Modalities   Modalities Cryotherapy   Cryotherapy   Number Minutes Cryotherapy 15 Minutes   Cryotherapy Location Ankle   Type of Cryotherapy Other (comment)  Game Ready, 3 snowflakes, high compression x 59min   Ankle Exercises: Aerobic   Stationary Bike Level 2 x 58minutes   Ankle Exercises: Standing   Rocker Board 2 minutes   Rebounder 3 directions x 1 min each   Ankle Exercises: Machines for Strengthening  Cybex Leg Press St8  85# 3 x 20 with Both LE,                   PT Short Term Goals - 05/11/14 1419    PT SHORT TERM GOAL #1   Title be indepenent with initial HEP   Time 4   Period Weeks   Status Achieved   PT SHORT TERM GOAL #2   Title wean from walker to use of cane for home distances > or = to 50% of the time   Time 4   Period Weeks   Status On-going   PT SHORT TERM GOAL #3   Title walk for 20-25 minutes without need to rest   Time 4   Period Weeks   Status On-going   PT SHORT TERM GOAL #4   Title walk with step-through gait pattern on level surface for all distances   Time 4   Period Weeks   Status On-going           PT Long Term Goals - 05/11/14 1420     PT LONG TERM GOAL #1   Title be independent in advanced HEP   Time 8   Period Weeks   Status On-going   PT LONG TERM GOAL #2   Title reduce FOTO to < or = to 43% limitation   Time 8   Period Weeks   Status On-going   PT LONG TERM GOAL #3   Title wean from walker to use of cane > or = to 50-75% of the time in the community   Time 8   Period Weeks   Status On-going   PT LONG TERM GOAL #4   Title walk for exercise > or= to 30 minutes with use of cane   Time 8   Period Weeks   Status On-going   PT LONG TERM GOAL #5   Title report < or = to 3/10 Rt foot pain with standing and walking long periods   Time 8   Period Weeks   Status On-going               Plan - 05/13/14 1205    Rehab Potential Good   PT Frequency 2x / week   PT Duration 8 weeks   PT Treatment/Interventions ADLs/Self Care Home Management;Therapeutic activities;Scar mobilization;Therapeutic exercise;Passive range of motion;Manual techniques;Gait training;Cryotherapy;Neuromuscular re-education;Electrical Stimulation;Other (comment)   PT Next Visit Plan Continue with weightbearing tolerance on right LE, improve gait pattern, endurance   Consulted and Agree with Plan of Care Patient        Problem List Patient Active Problem List   Diagnosis Date Noted  . 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 Anxiety   . Depression   . Fracture of distal end of right tibia 01/19/2014  . GERD (gastroesophageal reflux disease) 10/30/2013  . Fall from ladder 10/30/2013  . Acute urinary retention 10/30/2013  . Open fracture of tibial plafond with fibula involvement 10/24/2013    NAUMANN-HOUEGNIFIO,Renna Kilmer PTA 05/13/2014, 1:24 PM  [facility] [address], [place], [place], [address] Phone: [phone]   Fax:  [phone]

## 2014-05-18 ENCOUNTER — Ambulatory Visit: Payer: Medicare Other | Admitting: Physical Therapy

## 2014-05-18 ENCOUNTER — Encounter: Payer: Self-pay | Admitting: Physical Therapy

## 2014-05-18 DIAGNOSIS — R269 Unspecified abnormalities of gait and mobility: Secondary | ICD-10-CM

## 2014-05-18 DIAGNOSIS — M7989 Other specified soft tissue disorders: Secondary | ICD-10-CM

## 2014-05-18 DIAGNOSIS — R531 Weakness: Secondary | ICD-10-CM | POA: Diagnosis not present

## 2014-05-18 DIAGNOSIS — K219 Gastro-esophageal reflux disease without esophagitis: Secondary | ICD-10-CM | POA: Diagnosis not present

## 2014-05-18 DIAGNOSIS — M25571 Pain in right ankle and joints of right foot: Secondary | ICD-10-CM

## 2014-05-18 DIAGNOSIS — R6889 Other general symptoms and signs: Secondary | ICD-10-CM | POA: Diagnosis not present

## 2014-05-18 DIAGNOSIS — Z9889 Other specified postprocedural states: Secondary | ICD-10-CM | POA: Diagnosis not present

## 2014-05-18 DIAGNOSIS — S82871E Displaced pilon fracture of right tibia, subsequent encounter for open fracture type I or II with routine healing: Secondary | ICD-10-CM | POA: Diagnosis not present

## 2014-05-18 NOTE — Therapy (Signed)
[facility] [address] [place], [place], [address] Phone: [phone]   Fax:  [phone]  Physical Therapy Treatment  Patient Details  Name: Kevin Kim MRN: [id number] Date of Birth: 1945-03-11 Referring Provider:  Lajean Manes, MD  Encounter Date: 05/18/2014      PT End of Session - 05/18/14 1214    Visit Number 7   Number of Visits 10   Date for PT Re-Evaluation 06/14/14   PT Start Time 8466   PT Stop Time 1247   PT Time Calculation (min) 63 min   Activity Tolerance Patient tolerated treatment well   Behavior During Therapy [facility] for tasks assessed/performed      Past Medical History  Diagnosis Date  . GERD (gastroesophageal reflux disease)   . Irregular heartbeat     1980's.  . Pneumonia     last time 1993  . Anxiety     while in hospital  . Depression     Past Surgical History  Procedure Laterality Date  . I&d extremity Right 10/24/2013    Procedure: IRRIGATION AND DEBRIDEMENT OPEN RIGHT TIBIA/FIBULA FRACTURE;  Surgeon: Mauri Pole, MD;  Location: [place];  Service: Orthopedics;  Laterality: Right;  . External fixation leg Right 10/24/2013    Procedure: EXTERNAL FIXATION RIGHT LOWER LEG;  Surgeon: Mauri Pole, MD;  Location: [place];  Service: Orthopedics;  Laterality: Right;  . Closed reduction tibia Right 10/24/2013    Procedure: CLOSED REDUCTION TIBIA/FIBULA FRACTURE;  Surgeon: Mauri Pole, MD;  Location: [place];  Service: Orthopedics;  Laterality: Right;  . External fixation leg Right 10/27/2013    Procedure:   REVISION OF  EXTERNAL FIXATOR RIGHT LOWER LEG   ORIF  RIGHT TIBIAL PILON APPLICATION OF WOUND VAC;  Surgeon: Rozanna Box, MD;  Location: [place];  Service: Orthopedics;  Laterality: Right;  . Colonoscopy    . Hernia repair Bilateral [id number]    plus umbilicial-   . Orif tibia fracture Right 01/2014  . Cosmetic surgery Right     at [facility] 11/15/2013    left wrist to right  ankle   . Orif tibia fracture Right 01/19/2014    Procedure: OPEN REDUCTION INTERNAL FIXATION (ORIF) TIBIA/FIBULA FRACTURE REPAIR OF TIBIA/FIBULA NON UNION;  Surgeon: Rozanna Box, MD;  Location: [place];  Service: Orthopedics;  Laterality: Right;    There were no vitals filed for this visit.  Visit Diagnosis:  Abnormality of gait  Weakness generalized  Activity intolerance  Swelling of limb  Pain in joint, ankle and foot, right      Subjective Assessment - 05/18/14 1202    Subjective Pt reports ankle feels very tight, due to cold and rainy weather   Limitations Standing;Walking   How long Kim you stand comfortably? 20 minutes max   Currently in Pain? No/denies  no c/o of pain, but stiffness rated as 3/10   Pain Orientation Right   Pain Type Chronic pain   Pain Onset More than a month ago   Pain Frequency Intermittent   Multiple Pain Sites No                         [facility] Adult PT Treatment/Exercise - 05/18/14 0001    Ambulation/Gait   Gait Comments gait training with and without AD, obstacle course and changie in velocity  2 rounds with SPC, 2 rounds no AD but close Supervision   Knee/Hip Exercises: Standing  Lateral Step Up Right;3 sets;10 reps;Hand Hold: 2   Forward Step Up Right;3 sets;10 reps;Hand Hold: 2   Modalities   Modalities Cryotherapy   Cryotherapy   Number Minutes Cryotherapy 15 Minutes   Cryotherapy Location Ankle   Type of Cryotherapy Other (comment)  Game Ready: 3 flakes, high compression   Ankle Exercises: Aerobic   Stationary Bike Level 2 x 67minutes 15   Ankle Exercises: Standing   Rocker Board 2 minutes   Rebounder 3 directions x 1 min each                  PT Short Term Goals - 05/18/14 1231    PT SHORT TERM GOAL #1   Title be indepenent with initial HEP   Time 4   Period Weeks   Status Achieved   PT SHORT TERM GOAL #2   Title wean from walker to use of cane for home distances > or = to 50% of the time    Time 4   Period Weeks   Status Achieved   PT SHORT TERM GOAL #4   Title walk with step-through gait pattern on level surface for all distances   Time 4   Period Weeks   Status Achieved           PT Long Term Goals - 05/18/14 1232    PT LONG TERM GOAL #1   Title be independent in advanced HEP   Time 8   Period Weeks   Status On-going   PT LONG TERM GOAL #2   Title reduce FOTO to < or = to 43% limitation   Time 8   Period Weeks   Status On-going   PT LONG TERM GOAL #3   Title wean from walker to use of cane > or = to 50-75% of the time in the community   Time 8   Period Weeks   Status Achieved   PT LONG TERM GOAL #4   Title walk for exercise > or= to 30 minutes with use of cane  39min as of 05/18/14   Time 8   Period Weeks   Status On-going   PT LONG TERM GOAL #5   Title report < or = to 3/10 Rt foot pain with standing and walking long periods   Time 8   Period Weeks   Status On-going               Plan - 05/18/14 1230    Clinical Impression Statement Pt continues to improve with his gait pattern and confidence.    Pt will benefit from skilled therapeutic intervention in order to improve on the following deficits Abnormal gait;Decreased range of motion;Difficulty walking;Impaired flexibility;Increased edema;Decreased endurance;Decreased activity tolerance;Pain;Decreased mobility;Decreased strength   PT Frequency 2x / week   PT Duration 8 weeks   PT Treatment/Interventions ADLs/Self Care Home Management;Therapeutic activities;Scar mobilization;Therapeutic exercise;Passive range of motion;Manual techniques;Gait training;Cryotherapy;Neuromuscular re-education;Electrical Stimulation;Other (comment)   PT Next Visit Plan Continue with weightbearing tolerance on right LE, improve gait pattern, endurance   Consulted and Agree with Plan of Care Patient        Problem List Patient Active Problem List   Diagnosis Date Noted  . Type I or II open intra-articular  fracture of distal end of right tibia with nonunion 01/21/2014  . Anxiety   . Depression   . Fracture of distal end of right tibia 01/19/2014  . GERD (gastroesophageal reflux disease) 10/30/2013  . Fall from ladder 10/30/2013  .  Acute urinary retention 10/30/2013  . Open fracture of tibial plafond with fibula involvement 10/24/2013    Kim,Kevin Orman PTA 05/18/2014, 12:36 PM  [facility] [address] [place], [place], [address] Phone: [phone]   Fax:  [phone]

## 2014-05-20 ENCOUNTER — Encounter: Payer: Self-pay | Admitting: Physical Therapy

## 2014-05-20 ENCOUNTER — Ambulatory Visit: Payer: Medicare Other | Admitting: Physical Therapy

## 2014-05-20 DIAGNOSIS — R269 Unspecified abnormalities of gait and mobility: Secondary | ICD-10-CM

## 2014-05-20 DIAGNOSIS — R531 Weakness: Secondary | ICD-10-CM

## 2014-05-20 DIAGNOSIS — R6889 Other general symptoms and signs: Secondary | ICD-10-CM

## 2014-05-20 NOTE — Therapy (Incomplete)
[facility] [address], [place], [place], [address] Phone: [phone]   Fax:  [phone]  Physical Therapy Treatment  Patient Details  Name: Kevin Kim MRN: [id number] Date of Birth: [birth year] Referring Provider:  Lajean Manes, MD  Encounter Date: 05/20/2014      PT End of Session - 05/20/14 1315    Visit Number 8   Number of Visits 10   Date for PT Re-Evaluation 06/14/14   PT Start Time 1146   PT Stop Time 1230   PT Time Calculation (min) 44 min   Activity Tolerance Patient tolerated treatment well   Behavior During Therapy [facility] for tasks assessed/performed      Past Medical History  Diagnosis Date  . GERD (gastroesophageal reflux disease)   . Irregular heartbeat     1980's.  . Pneumonia     last time 1993  . Anxiety     while in hospital  . Depression     Past Surgical History  Procedure Laterality Date  . I&d extremity Right 10/24/2013    Procedure: IRRIGATION AND DEBRIDEMENT OPEN RIGHT TIBIA/FIBULA FRACTURE;  Surgeon: Mauri Pole, MD;  Location: [place];  Service: Orthopedics;  Laterality: Right;  . External fixation leg Right 10/24/2013    Procedure: EXTERNAL FIXATION RIGHT LOWER LEG;  Surgeon: Mauri Pole, MD;  Location: [place];  Service: Orthopedics;  Laterality: Right;  . Closed reduction tibia Right 10/24/2013    Procedure: CLOSED REDUCTION TIBIA/FIBULA FRACTURE;  Surgeon: Mauri Pole, MD;  Location: [place];  Service: Orthopedics;  Laterality: Right;  . External fixation leg Right 10/27/2013    Procedure:   REVISION OF  EXTERNAL FIXATOR RIGHT LOWER LEG   ORIF  RIGHT TIBIAL PILON APPLICATION OF WOUND VAC;  Surgeon: Rozanna Box, MD;  Location: [place];  Service: Orthopedics;  Laterality: Right;  . Colonoscopy    . Hernia repair Bilateral [id number]    plus umbilicial-   . Orif tibia fracture Right 01/2014  . Cosmetic surgery Right     at [facility] 11/15/2013    left wrist to right  ankle   . Orif tibia fracture Right 01/19/2014    Procedure: OPEN REDUCTION INTERNAL FIXATION (ORIF) TIBIA/FIBULA FRACTURE REPAIR OF TIBIA/FIBULA NON UNION;  Surgeon: Rozanna Box, MD;  Location: [place];  Service: Orthopedics;  Laterality: Right;    There were no vitals filed for this visit.  Visit Diagnosis:  Abnormality of gait  Weakness generalized  Activity intolerance      Subjective Assessment - 05/20/14 1406    Subjective Pt continues to complain of stiffness in Rt ankle, esspecially in the  morning   Currently in Pain? No/denies   Multiple Pain Sites No                         [facility] Adult PT Treatment/Exercise - 05/20/14 0001    Ambulation/Gait   Gait Comments gait training with SPC, with focus on increasing velocity  60feet x 6   Knee/Hip Exercises: Aerobic   Stationary Bike L3 x 12 min   Elliptical incline2/resistance 1 x 77min to increase weightbearing tolerance on Rt LE   Tread Mill Rt foot on TM to improve gait pattern & ROM x 61min   Left foot stationary and using B UE for support   Cryotherapy   Type of Cryotherapy --  no Cryotherapy today   Ankle Exercises: Machines for Strengthening  Cybex Leg Press St8  90# 3 x 10 with Both LE, Rt LE 35# 3x10  try to increase weight next visit   Ankle Exercises: Standing   Rocker Board 2 minutes  DF/PF & Inversion/Eversion x 2 min each                   PT Short Term Goals - 05/18/14 1231    PT SHORT TERM GOAL #1   Title be indepenent with initial HEP   Time 4   Period Weeks   Status Achieved   PT SHORT TERM GOAL #2   Title wean from walker to use of cane for home distances > or = to 50% of the time   Time 4   Period Weeks   Status Achieved   PT SHORT TERM GOAL #4   Title walk with step-through gait pattern on level surface for all distances   Time 4   Period Weeks   Status Achieved           PT Long Term Goals - 05/18/14 1232    PT LONG TERM GOAL #1   Title be independent  in advanced HEP   Time 8   Period Weeks   Status On-going   PT LONG TERM GOAL #2   Title reduce FOTO to < or = to 43% limitation   Time 8   Period Weeks   Status On-going   PT LONG TERM GOAL #3   Title wean from walker to use of cane > or = to 50-75% of the time in the community   Time 8   Period Weeks   Status Achieved   PT LONG TERM GOAL #4   Title walk for exercise > or= to 30 minutes with use of cane  1min as of 05/18/14   Time 8   Period Weeks   Status On-going   PT LONG TERM GOAL #5   Title report < or = to 3/10 Rt foot pain with standing and walking long periods   Time 8   Period Weeks   Status On-going               Plan - 05/20/14 1316    Clinical Impression Statement Pt continues to improve with endurance and strength as shown with increased weight with leg press   Pt will benefit from skilled therapeutic intervention in order to improve on the following deficits Abnormal gait;Decreased range of motion;Difficulty walking;Impaired flexibility;Increased edema;Decreased endurance;Decreased activity tolerance;Pain;Decreased mobility;Decreased strength   Rehab Potential Good   PT Frequency 2x / week   PT Duration 8 weeks   PT Treatment/Interventions ADLs/Self Care Home Management;Therapeutic activities;Scar mobilization;Therapeutic exercise;Passive range of motion;Manual techniques;Gait training;Cryotherapy;Neuromuscular re-education;Electrical Stimulation;Other (comment)   PT Next Visit Plan Continue with weightbearing tolerance on right LE, improve gait pattern, endurance   Consulted and Agree with Plan of Care Patient        Problem List Patient Active Problem List   Diagnosis Date Noted  . 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 Anxiety   . Depression   . Fracture of distal end of right tibia 01/19/2014  . GERD (gastroesophageal reflux disease) 10/30/2013  . Fall from ladder 10/30/2013  .  Acute urinary retention 10/30/2013  . Open fracture of tibial plafond with fibula involvement 10/24/2013    NAUMANN-HOUEGNIFIO,Verlie Hellenbrand PTA 05/20/2014, 2:06 PM  [facility] [address]  [place], [place], [address] Phone: [phone]   Fax:  [phone]

## 2014-05-25 ENCOUNTER — Ambulatory Visit: Payer: Medicare Other

## 2014-05-25 DIAGNOSIS — K219 Gastro-esophageal reflux disease without esophagitis: Secondary | ICD-10-CM | POA: Diagnosis not present

## 2014-05-25 DIAGNOSIS — M7989 Other specified soft tissue disorders: Secondary | ICD-10-CM

## 2014-05-25 DIAGNOSIS — M25571 Pain in right ankle and joints of right foot: Secondary | ICD-10-CM

## 2014-05-25 DIAGNOSIS — S82871E Displaced pilon fracture of right tibia, subsequent encounter for open fracture type I or II with routine healing: Secondary | ICD-10-CM | POA: Diagnosis not present

## 2014-05-25 DIAGNOSIS — R6889 Other general symptoms and signs: Secondary | ICD-10-CM | POA: Diagnosis not present

## 2014-05-25 DIAGNOSIS — R531 Weakness: Secondary | ICD-10-CM | POA: Diagnosis not present

## 2014-05-25 DIAGNOSIS — Z9889 Other specified postprocedural states: Secondary | ICD-10-CM | POA: Diagnosis not present

## 2014-05-25 DIAGNOSIS — R269 Unspecified abnormalities of gait and mobility: Secondary | ICD-10-CM

## 2014-05-25 NOTE — Therapy (Signed)
[facility] [address] [place], [place], [address] Phone: [phone]   Fax:  [phone]  Physical Therapy Treatment  Patient Details  Name: Kevin Kim MRN: [id number] Date of Birth: [birth year] Referring Provider:  Lajean Manes, MD  Encounter Date: 05/25/2014      PT End of Session - 05/25/14 1411    Visit Number 9   Number of Visits 10   Date for PT Re-Evaluation 06/14/14   PT Start Time 1137   PT Stop Time 1240   PT Time Calculation (min) 63 min   Activity Tolerance Patient tolerated treatment well   Behavior During Therapy [facility] for tasks assessed/performed      Past Medical History  Diagnosis Date  . GERD (gastroesophageal reflux disease)   . Irregular heartbeat     1980's.  . Pneumonia     last time 1993  . Anxiety     while in hospital  . Depression     Past Surgical History  Procedure Laterality Date  . I&d extremity Right 10/24/2013    Procedure: IRRIGATION AND DEBRIDEMENT OPEN RIGHT TIBIA/FIBULA FRACTURE;  Surgeon: Mauri Pole, MD;  Location: [place];  Service: Orthopedics;  Laterality: Right;  . External fixation leg Right 10/24/2013    Procedure: EXTERNAL FIXATION RIGHT LOWER LEG;  Surgeon: Mauri Pole, MD;  Location: [place];  Service: Orthopedics;  Laterality: Right;  . Closed reduction tibia Right 10/24/2013    Procedure: CLOSED REDUCTION TIBIA/FIBULA FRACTURE;  Surgeon: Mauri Pole, MD;  Location: [place];  Service: Orthopedics;  Laterality: Right;  . External fixation leg Right 10/27/2013    Procedure:   REVISION OF  EXTERNAL FIXATOR RIGHT LOWER LEG   ORIF  RIGHT TIBIAL PILON APPLICATION OF WOUND VAC;  Surgeon: Rozanna Box, MD;  Location: [place];  Service: Orthopedics;  Laterality: Right;  . Colonoscopy    . Hernia repair Bilateral [id number]    plus umbilicial-   . Orif tibia fracture Right 01/2014  . Cosmetic surgery Right     at [facility] 11/15/2013    left wrist to right  ankle   . Orif tibia fracture Right 01/19/2014    Procedure: OPEN REDUCTION INTERNAL FIXATION (ORIF) TIBIA/FIBULA FRACTURE REPAIR OF TIBIA/FIBULA NON UNION;  Surgeon: Rozanna Box, MD;  Location: [place];  Service: Orthopedics;  Laterality: Right;    There were no vitals filed for this visit.  Visit Diagnosis:  Weakness generalized  Abnormality of gait  Activity intolerance  Swelling of limb  Pain in joint, ankle and foot, right      Subjective Assessment - 05/25/14 1141    Subjective Continues to feel stiff first thing in the morning. Able to walk better with shoes on rather than off. Currently feels some soreness in the ankle.    How long can you walk comfortably? 10 minutes on hilly terrain   Currently in Pain? No/denies                         [facility] Adult PT Treatment/Exercise - 05/25/14 0001    Knee/Hip Exercises: Aerobic   Stationary Bike L3 x 12 min   Elliptical incline2/resistance 1 x 43min to increase weightbearing tolerance on Rt LE  Able to progress to 4 minutes today   Knee/Hip Exercises: Standing   Lateral Step Up Right;3 sets;10 reps;Hand Hold: 2   Forward Step Up Right;3 sets;10 reps;Hand Hold: 1   Modalities  Modalities Vasopneumatic   Cryotherapy   Number Minutes Cryotherapy --   Cryotherapy Location --   Type of Cryotherapy --   Vasopneumatic   Number Minutes Vasopneumatic  15 minutes   Vasopnuematic Location  Ankle  Rt   Vasopneumatic Pressure Medium   Vasopneumatic Temperature  3 snowflakes   Ankle Exercises: Machines for Strengthening   Cybex Leg Press St 8 95# 3x10 with Bil LE; Rt LE 35# 3x10   Ankle Exercises: Standing   Rocker Board 2 minutes  Seated; DF/PF & Inversion/Eversion x 2 min each; R foot only   Rebounder 3 directions x 1 min each                  PT Short Term Goals - 05/25/14 1145    PT SHORT TERM GOAL #3   Title walk for 20-25 minutes without need to rest  Able to walk 10 minutes before needing a  rest break   Time 4   Period Weeks   Status On-going           PT Long Term Goals - 05/25/14 1146    PT LONG TERM GOAL #1   Title be independent in advanced HEP   Time 8   Period Weeks   Status On-going   PT LONG TERM GOAL #4   Title walk for exercise > or= to 30 minutes with use of cane  Able to walk about 10 minutes on hilly terrain   Time 8   Status On-going               Plan - 05/25/14 1411    Clinical Impression Statement Pt was able to progress strength and cardiovascular exercises today. Continues to demonstrate decreased cardiovascular endurance for physical activity and limited ankle ROM (DF, eversion) for appropriate gait mechanics. Pt will benefit from skilled PT for continued endurance/strength training and gait training.   Pt will benefit from skilled therapeutic intervention in order to improve on the following deficits Abnormal gait;Decreased range of motion;Difficulty walking;Impaired flexibility;Increased edema;Decreased endurance;Decreased activity tolerance;Pain;Decreased mobility;Decreased strength   Rehab Potential Good   PT Frequency 2x / week   PT Duration 8 weeks   PT Treatment/Interventions ADLs/Self Care Home Management;Therapeutic activities;Scar mobilization;Therapeutic exercise;Passive range of motion;Manual techniques;Gait training;Cryotherapy;Neuromuscular re-education;Electrical Stimulation;Other (comment)   PT Next Visit Plan Continue with gait specific activities - ambulation/endurance exercises with cane (outside to practice uneven surfaces), Rt. foot on treadmill for gait pattern/ROM, progress leg press weight .  G-codes next session.   Consulted and Agree with Plan of Care Patient        Problem List Patient Active Problem List   Diagnosis Date Noted  . 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 Anxiety   . Depression   . Fracture of distal end of right tibia 01/19/2014   . GERD (gastroesophageal reflux disease) 10/30/2013  . Fall from ladder 10/30/2013  . Acute urinary retention 10/30/2013  . Open fracture of tibial plafond with fibula involvement 10/24/2013    Reginal Lutes, SPT 05/25/2014 3:00 PM  [facility] [facility] [address], [place], [place], [address] Phone: [phone]   Fax:  [phone]

## 2014-05-27 ENCOUNTER — Ambulatory Visit: Payer: Medicare Other | Admitting: Physical Therapy

## 2014-05-27 ENCOUNTER — Encounter: Payer: Self-pay | Admitting: Physical Therapy

## 2014-05-27 DIAGNOSIS — R6889 Other general symptoms and signs: Secondary | ICD-10-CM

## 2014-05-27 DIAGNOSIS — Z9889 Other specified postprocedural states: Secondary | ICD-10-CM | POA: Diagnosis not present

## 2014-05-27 DIAGNOSIS — R531 Weakness: Secondary | ICD-10-CM

## 2014-05-27 DIAGNOSIS — M25571 Pain in right ankle and joints of right foot: Secondary | ICD-10-CM

## 2014-05-27 DIAGNOSIS — S82871E Displaced pilon fracture of right tibia, subsequent encounter for open fracture type I or II with routine healing: Secondary | ICD-10-CM | POA: Diagnosis not present

## 2014-05-27 DIAGNOSIS — M7989 Other specified soft tissue disorders: Secondary | ICD-10-CM

## 2014-05-27 DIAGNOSIS — K219 Gastro-esophageal reflux disease without esophagitis: Secondary | ICD-10-CM | POA: Diagnosis not present

## 2014-05-27 DIAGNOSIS — R269 Unspecified abnormalities of gait and mobility: Secondary | ICD-10-CM

## 2014-05-27 NOTE — Therapy (Signed)
[facility] [address], [place], [place], [address] Phone: [phone]   Fax:  [phone]  Physical Therapy Treatment  Patient Details  Name: Kevin Kim MRN: [id number] Date of Birth: [birth year] Referring Provider:  Lajean Manes, MD  Encounter Date: 05/27/2014      PT End of Session - 05/27/14 1217    Visit Number 10   Number of Visits 10   Date for PT Re-Evaluation 06/14/14   PT Start Time 1146   PT Stop Time 1247   PT Time Calculation (min) 61 min   Activity Tolerance Patient tolerated treatment well   Behavior During Therapy [facility] for tasks assessed/performed      Past Medical History  Diagnosis Date  . GERD (gastroesophageal reflux disease)   . Irregular heartbeat     1980's.  . Pneumonia     last time 1993  . Anxiety     while in hospital  . Depression     Past Surgical History  Procedure Laterality Date  . I&d extremity Right 10/24/2013    Procedure: IRRIGATION AND DEBRIDEMENT OPEN RIGHT TIBIA/FIBULA FRACTURE;  Surgeon: Mauri Pole, MD;  Location: [place];  Service: Orthopedics;  Laterality: Right;  . External fixation leg Right 10/24/2013    Procedure: EXTERNAL FIXATION RIGHT LOWER LEG;  Surgeon: Mauri Pole, MD;  Location: [place];  Service: Orthopedics;  Laterality: Right;  . Closed reduction tibia Right 10/24/2013    Procedure: CLOSED REDUCTION TIBIA/FIBULA FRACTURE;  Surgeon: Mauri Pole, MD;  Location: [place];  Service: Orthopedics;  Laterality: Right;  . External fixation leg Right 10/27/2013    Procedure:   REVISION OF  EXTERNAL FIXATOR RIGHT LOWER LEG   ORIF  RIGHT TIBIAL PILON APPLICATION OF WOUND VAC;  Surgeon: Rozanna Box, MD;  Location: [place];  Service: Orthopedics;  Laterality: Right;  . Colonoscopy    . Hernia repair Bilateral [id number]    plus umbilicial-   . Orif tibia fracture Right 01/2014  . Cosmetic surgery Right     at [facility] 11/15/2013    left wrist to right  ankle   . Orif tibia fracture Right 01/19/2014    Procedure: OPEN REDUCTION INTERNAL FIXATION (ORIF) TIBIA/FIBULA FRACTURE REPAIR OF TIBIA/FIBULA NON UNION;  Surgeon: Rozanna Box, MD;  Location: [place];  Service: Orthopedics;  Laterality: Right;    There were no vitals filed for this visit.  Visit Diagnosis:  Weakness generalized  Abnormality of gait  Activity intolerance  Swelling of limb  Pain in joint, ankle and foot, right          [facility] PT Assessment - 05/27/14 0001    Observation/Other Assessments   Focus on Therapeutic Outcomes (FOTO)  47%   Ambulation/Gait   Gait Comments gait training along kitchen counter without SPC and no UE support                     [facility] Adult PT Treatment/Exercise - 05/27/14 0001    Knee/Hip Exercises: Aerobic   Stationary Bike L3 x 46mn   Elliptical incline2/resistance 1 x 524m to increase weightbearing tolerance on Rt LE  2 min backward during this time   Knee/Hip Exercises: Standing   Lateral Step Up Right;3 sets;10 reps;Hand Hold: 2   Forward Step Up Right;3 sets;10 reps;Hand Hold: 1   Modalities   Modalities Vasopneumatic   Vasopneumatic   Number Minutes Vasopneumatic  15 minutes   Vasopnuematic Location  Ankle   Vasopneumatic Pressure Medium   Vasopneumatic Temperature  3 snowflakes   Ankle Exercises: Standing   Rocker Board 2 minutes;Other (comment)  in sitting DF/PF   Rebounder 3 directions x 1 min each                  PT Short Term Goals - 05/25/14 1145    PT SHORT TERM GOAL #3   Title walk for 20-25 minutes without need to rest  Able to walk 10 minutes before needing a rest break   Time 4   Period Weeks   Status On-going           PT Long Term Goals - 05/27/14 1330    PT LONG TERM GOAL #1   Title be independent in advanced HEP   Time 8   Period Weeks   Status On-going   PT LONG TERM GOAL #2   Title reduce FOTO to < or = to 43% limitation  as of 05/27/14  47%   Time 8    Period Weeks   Status Partially Met   PT LONG TERM GOAL #3   Title wean from walker to use of cane > or = to 50-75% of the time in the community   Time 8   Period Weeks   Status Achieved   PT LONG TERM GOAL #4   Title walk for exercise > or= to 30 minutes with use of cane   Time 8   Period Weeks   Status On-going   PT LONG TERM GOAL #5   Title report < or = to 3/10 Rt foot pain with standing and walking long periods   Time 8   Period Weeks   Status On-going               Plan - 05/27/14 1218    Clinical Impression Statement Pt is able to increase time on Elliptical and 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   Pt will benefit from skilled therapeutic intervention in order to improve on the following deficits Abnormal gait;Decreased range of motion;Difficulty walking;Impaired flexibility;Increased edema;Decreased endurance;Decreased activity tolerance;Pain;Decreased mobility;Decreased strength   Rehab Potential Good   PT Frequency 2x / week   PT Duration 8 weeks   PT Treatment/Interventions ADLs/Self Care Home Management;Therapeutic activities;Scar mobilization;Therapeutic exercise;Passive range of motion;Manual techniques;Gait training;Cryotherapy;Neuromuscular re-education;Electrical Stimulation;Other (comment)   PT Next Visit Plan Continue with gait specific activities - may practice outside walking,    Consulted and Agree with Plan of Care Patient          G-Codes - 05/27/14 1334    Functional Assessment Tool Used FOTO: 47% limitation   Functional Limitation Mobility: Walking and moving around   Mobility: Walking and Moving Around Current Status (G8978) At least 40 percent but less than 60 percent impaired, limited or restricted   Mobility: Walking and Moving Around Goal Status (G8979) At least 40 percent but less than 60 percent impaired, limited or restricted      Problem List Patient Active Problem List   Diagnosis Date Noted  . Type I or II open  intra-articular fracture of distal end of right tibia with nonunion 01/21/2014  . Anxiety   . Depression   . Fracture of distal end of right tibia 01/19/2014  . GERD (gastroesophageal reflux disease) 10/30/2013  . Fall from ladder 10/30/2013  . Acute urinary retention 10/30/2013  . Open fracture of tibial plafond with fibula involvement 10/24/2013    NAUMANN-HOUEGNIFIO, PTA   05/27/2014, 1:36 PM  [facility] [address], [place], [place], [address] Phone: [phone]   Fax:  [phone]

## 2014-06-01 ENCOUNTER — Ambulatory Visit: Payer: Medicare Other

## 2014-06-01 DIAGNOSIS — Z9889 Other specified postprocedural states: Secondary | ICD-10-CM | POA: Diagnosis not present

## 2014-06-01 DIAGNOSIS — R531 Weakness: Secondary | ICD-10-CM

## 2014-06-01 DIAGNOSIS — S82871E Displaced pilon fracture of right tibia, subsequent encounter for open fracture type I or II with routine healing: Secondary | ICD-10-CM | POA: Diagnosis not present

## 2014-06-01 DIAGNOSIS — R269 Unspecified abnormalities of gait and mobility: Secondary | ICD-10-CM

## 2014-06-01 DIAGNOSIS — R6889 Other general symptoms and signs: Secondary | ICD-10-CM | POA: Diagnosis not present

## 2014-06-01 DIAGNOSIS — K219 Gastro-esophageal reflux disease without esophagitis: Secondary | ICD-10-CM | POA: Diagnosis not present

## 2014-06-01 NOTE — Therapy (Signed)
[facility] [address] [place], [place], [address] Phone: [phone]   Fax:  [phone]  Physical Therapy Treatment  Patient Details  Name: Kevin Kim MRN: [id number] Date of Birth: [birth year] Referring Provider:  Lajean Manes, MD  Encounter Date: 06/01/2014      PT End of Session - 06/01/14 1145    Visit Number 11   Number of Visits 20   Date for PT Re-Evaluation 06/14/14   PT Start Time 1143   PT Stop Time 1232   PT Time Calculation (min) 49 min   Activity Tolerance Patient tolerated treatment well   Behavior During Therapy [facility] for tasks assessed/performed      Past Medical History  Diagnosis Date  . GERD (gastroesophageal reflux disease)   . Irregular heartbeat     1980's.  . Pneumonia     last time 1993  . Anxiety     while in hospital  . Depression     Past Surgical History  Procedure Laterality Date  . I&d extremity Right 10/24/2013    Procedure: IRRIGATION AND DEBRIDEMENT OPEN RIGHT TIBIA/FIBULA FRACTURE;  Surgeon: Mauri Pole, MD;  Location: [place];  Service: Orthopedics;  Laterality: Right;  . External fixation leg Right 10/24/2013    Procedure: EXTERNAL FIXATION RIGHT LOWER LEG;  Surgeon: Mauri Pole, MD;  Location: [place];  Service: Orthopedics;  Laterality: Right;  . Closed reduction tibia Right 10/24/2013    Procedure: CLOSED REDUCTION TIBIA/FIBULA FRACTURE;  Surgeon: Mauri Pole, MD;  Location: [place];  Service: Orthopedics;  Laterality: Right;  . External fixation leg Right 10/27/2013    Procedure:   REVISION OF  EXTERNAL FIXATOR RIGHT LOWER LEG   ORIF  RIGHT TIBIAL PILON APPLICATION OF WOUND VAC;  Surgeon: Rozanna Box, MD;  Location: [place];  Service: Orthopedics;  Laterality: Right;  . Colonoscopy    . Hernia repair Bilateral [id number]    plus umbilicial-   . Orif tibia fracture Right 01/2014  . Cosmetic surgery Right     at [facility] 11/15/2013    left wrist to right  ankle   . Orif tibia fracture Right 01/19/2014    Procedure: OPEN REDUCTION INTERNAL FIXATION (ORIF) TIBIA/FIBULA FRACTURE REPAIR OF TIBIA/FIBULA NON UNION;  Surgeon: Rozanna Box, MD;  Location: [place];  Service: Orthopedics;  Laterality: Right;    There were no vitals filed for this visit.  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 Feels muscle soreness today.    How long can you stand comfortably? 20 minutes    How long can you walk comfortably? Able to walk 15 minutes to walk half a mile with [facility]    Currently in Pain? No/denies                         [facility] Adult PT Treatment/Exercise - 06/01/14 0001    Ambulation/Gait   Ambulation/Gait Yes   Ambulation/Gait Assistance 6: Modified independent (Device/Increase time)   Assistive device --  SPC   Gait Pattern Step-through pattern   Ambulation Surface Paved;Outdoor;Grass;Unlevel   Curb 6: Modified independent (Device/increase time)   Gait Comments Limited pelvic mobility with ambulation  Knee/Hip Exercises: Aerobic   Stationary Bike L3 x 56min   Elliptical incline2/resistance 1 x 39min    Ankle Exercises: Machines for Strengthening   Cybex Leg Press St 8 100# 3x10 with Bil LE; Rt LE 40# 3x10   Ankle Exercises: Standing   Rocker Board 2 minutes;Other (comment)  DF/PF                  PT Short Term Goals - 06/01/14 1150    PT SHORT TERM GOAL #3   Title walk for 20-25 minutes without need to rest  Able to walk 15 minutes without needing rest    Time 4   Period Weeks   Status On-going           PT Long Term Goals - 06/01/14 1151    PT LONG TERM GOAL #5   Title report < or = to 3/10 Rt foot pain with standing and walking long periods  3/10 pain when walking  for 15 minutes; pain primarily from muscle soreness/stiffness in the morning   Time 8   Period Weeks   Status Achieved               Plan - 06/01/14 1208    Clinical Impression Statement Pt able to manage curbs, grassy hills and uneven pavement with use of SPC independently. Presents with decreased pelvic mobility with ambulation. Able to increase ellipitical time to 6 minutes for endurance training; patient required 1 min rest break afterward. Requires skilled PT for continued strength and gait training.      Pt will benefit from skilled therapeutic intervention in order to improve on the following deficits Abnormal gait;Decreased range of motion;Difficulty walking;Impaired flexibility;Increased edema;Decreased endurance;Decreased activity tolerance;Pain;Decreased mobility;Decreased strength   Rehab Potential Good   PT Frequency 2x / week   PT Duration 8 weeks   PT Treatment/Interventions ADLs/Self Care Home Management;Therapeutic activities;Scar mobilization;Therapeutic exercise;Passive range of motion;Manual techniques;Gait training;Cryotherapy;Neuromuscular re-education;Electrical Stimulation;Other (comment)   PT Next Visit Plan Continue with endurance training, check ankle strength next session, perform pelvic tilts on ball, side stepping with theraband, try ambulation activities without cane.    Consulted and Agree with Plan of Care Patient        Problem List Patient Active Problem List   Diagnosis Date Noted  . 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 Anxiety   . Depression   . Fracture of distal end of right tibia 01/19/2014  . GERD (gastroesophageal reflux disease) 10/30/2013  . Fall from ladder 10/30/2013  . Acute urinary retention 10/30/2013  . Open fracture of tibial plafond with fibula involvement 10/24/2013    Reginal Lutes, SPT 06/01/2014 12:43 PM  [facility] [address] [place], [place], [address] Phone: [phone]   Fax:  [phone]

## 2014-06-02 DIAGNOSIS — S82391N Other fracture of lower end of right tibia, subsequent encounter for open fracture type IIIA, IIIB, or IIIC with nonunion: Secondary | ICD-10-CM | POA: Diagnosis not present

## 2014-06-02 DIAGNOSIS — M722 Plantar fascial fibromatosis: Secondary | ICD-10-CM | POA: Diagnosis not present

## 2014-06-03 ENCOUNTER — Ambulatory Visit: Payer: Medicare Other | Attending: Orthopedic Surgery

## 2014-06-03 DIAGNOSIS — M25571 Pain in right ankle and joints of right foot: Secondary | ICD-10-CM | POA: Insufficient documentation

## 2014-06-03 DIAGNOSIS — R6889 Other general symptoms and signs: Secondary | ICD-10-CM | POA: Insufficient documentation

## 2014-06-03 DIAGNOSIS — R531 Weakness: Secondary | ICD-10-CM | POA: Insufficient documentation

## 2014-06-03 DIAGNOSIS — M7989 Other specified soft tissue disorders: Secondary | ICD-10-CM | POA: Diagnosis not present

## 2014-06-03 DIAGNOSIS — R269 Unspecified abnormalities of gait and mobility: Secondary | ICD-10-CM | POA: Diagnosis not present

## 2014-06-03 NOTE — Therapy (Addendum)
[facility] [address], [place], [place], [address] Phone: [phone]   Fax:  [phone]  Physical Therapy Treatment  Patient Details  Name: Kevin Kim MRN: [id number] Date of Birth: 1945-03-18 Referring Provider:  Lajean Manes, MD  Encounter Date: 06/03/2014      PT End of Session - 06/03/14 1155    Visit Number 12   Number of Visits 20  Medicare   Date for PT Re-Evaluation 07/26/14   PT Start Time 5284   PT Stop Time 1231   PT Time Calculation (min) 47 min   Activity Tolerance Patient tolerated treatment well   Behavior During Therapy [facility] for tasks assessed/performed      Past Medical History  Diagnosis Date  . GERD (gastroesophageal reflux disease)   . Irregular heartbeat     1980's.  . Pneumonia     last time 1993  . Anxiety     while in hospital  . Depression     Past Surgical History  Procedure Laterality Date  . I&d extremity Right 10/24/2013    Procedure: IRRIGATION AND DEBRIDEMENT OPEN RIGHT TIBIA/FIBULA FRACTURE;  Surgeon: Mauri Pole, MD;  Location: [place];  Service: Orthopedics;  Laterality: Right;  . External fixation leg Right 10/24/2013    Procedure: EXTERNAL FIXATION RIGHT LOWER LEG;  Surgeon: Mauri Pole, MD;  Location: [place];  Service: Orthopedics;  Laterality: Right;  . Closed reduction tibia Right 10/24/2013    Procedure: CLOSED REDUCTION TIBIA/FIBULA FRACTURE;  Surgeon: Mauri Pole, MD;  Location: [place];  Service: Orthopedics;  Laterality: Right;  . External fixation leg Right 10/27/2013    Procedure:   REVISION OF  EXTERNAL FIXATOR RIGHT LOWER LEG   ORIF  RIGHT TIBIAL PILON APPLICATION OF WOUND VAC;  Surgeon: Rozanna Box, MD;  Location: [place];  Service: Orthopedics;  Laterality: Right;  . Colonoscopy    . Hernia repair Bilateral [id number]    plus umbilicial-   . Orif tibia fracture Right 01/2014  . Cosmetic surgery Right     at [facility] 11/15/2013    left wrist  to right ankle   . Orif tibia fracture Right 01/19/2014    Procedure: OPEN REDUCTION INTERNAL FIXATION (ORIF) TIBIA/FIBULA FRACTURE REPAIR OF TIBIA/FIBULA NON UNION;  Surgeon: Rozanna Box, MD;  Location: [place];  Service: Orthopedics;  Laterality: Right;    There were no vitals filed for this visit.  Visit Diagnosis:  Weakness generalized - Plan: PT plan of care cert/re-cert  Abnormality of gait - Plan: PT plan of care cert/re-cert  Pain in joint, ankle and foot, right - Plan: PT plan of care cert/re-cert      Subjective Assessment - 06/03/14 1156    Subjective Went to [place] yesterday. Continues to feel stiff but feels better with moving.    How long can you stand comfortably? 15-20 minutes    How long can you walk comfortably? Walking about half a mile, 15 minutes    Currently in Pain? Yes   Pain Score 3    Pain Location Ankle   Pain Orientation Right   Pain Descriptors / Indicators Dull   Pain Type Chronic pain   Pain Onset More than a month ago   Pain Frequency Intermittent   Aggravating Factors  Movement, standing    Pain Relieving Factors Sitting, ice, aspirin    Effect of Pain on Daily Activities Unable to walk for exercise    Multiple Pain Sites  No            [facility] PT Assessment - 06/03/14 0001    Assessment   Medical Diagnosis Rt pilon fracture with ORIF   Onset Date/Surgical Date 10/24/13   Observation/Other Assessments   Focus on Therapeutic Outcomes (FOTO)  47%                     [facility] Adult PT Treatment/Exercise - 06/03/14 0001    Ambulation/Gait   Ambulation/Gait Yes   Ambulation/Gait Assistance 6: Modified independent (Device/Increase time)   Assistive device None   Gait Pattern Step-through pattern;Decreased step length - right;Decreased step length - left;Decreased dorsiflexion - right   Ambulation Surface Level   Gait Comments Increased pelvic mobility without cane; decreased step length bil.   visual/verbal cues provided to promote  step throught pattern   Exercises   Exercises Lumbar   Lumbar Exercises: Machines for Strengthening   Stationary Bike L3 x 24mn   Lumbar Exercises: Seated   Other Seated Lumbar Exercises Pelvic tilts on green ball CW/CC x10    Knee/Hip Exercises: Standing   Heel Raises 3 sets;10 reps   Lateral Step Up 3 sets;10 reps;Hand Hold: 2;Right   Forward Step Up 3 sets;10 reps;Hand Hold: 2;Right   SLS SLSx3 on trampoline; alternate legs for 30 seconds   Ankle Exercises: Machines for Strengthening   Cybex Leg Press St 8 100# 3x10 with Bil LE; Rt LE 40# 3x10   Ankle Exercises: Standing   Rocker Board --   Rebounder 3 directions x 1 min each                  PT Short Term Goals - 06/01/14 1150    PT SHORT TERM GOAL #3   Title walk for 20-25 minutes without need to rest  Able to walk 15 minutes without needing rest    Time 4   Period Weeks   Status On-going           PT Long Term Goals - 06/03/14 1247    PT LONG TERM GOAL #1   Title be independent in advanced HEP   Status On-going   PT LONG TERM GOAL #2   Title reduce FOTO to < or = to 43% limitation   Status Partially Met   PT LONG TERM GOAL #3   Title wean from walker to use of cane > or = to 50-75% of the time in the community   Status Achieved   PT LONG TERM GOAL #4   Title walk for exercise > or= to 30 minutes with use of cane   Status On-going  15 minutes    PT LONG TERM GOAL #5   Title report < or = to 3/10 Rt foot pain with standing and walking long periods   Status Achieved   Additional Long Term Goals   Additional Long Term Goals Yes   PT LONG TERM GOAL #6   Title wean from cane to no device > or = to 50% of the time with safety   Time 6   Period Weeks   Status New               Plan - 06/03/14 1236    Clinical Impression Statement Pt able to ambulate safely for short distances (100 ft.) without SPC. Decreased step length bil. and required verbal and visual cues to promote heel strike. Will  continue to benefit from PT for gait, endurance and strength  training.    Pt will benefit from skilled therapeutic intervention in order to improve on the following deficits Abnormal gait;Decreased range of motion;Difficulty walking;Impaired flexibility;Increased edema;Decreased endurance;Decreased activity tolerance;Pain;Decreased mobility;Decreased strength   Rehab Potential Good   PT Frequency 2x / week   PT Duration 6 weeks   PT Treatment/Interventions ADLs/Self Care Home Management;Therapeutic activities;Scar mobilization;Therapeutic exercise;Passive range of motion;Manual techniques;Gait training;Cryotherapy;Neuromuscular re-education;Electrical Stimulation;Other (comment)   PT Next Visit Plan Continue ambulation practice without SPC with visual cues/obstacle course, pelvic tilts, side stepping with theraband, ellpitical at 7 min    Consulted and Agree with Plan of Care Patient        Problem List Patient Active Problem List   Diagnosis Date Noted  . 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 Anxiety   . Depression   . Fracture of distal end of right tibia 01/19/2014  . GERD (gastroesophageal reflux disease) 10/30/2013  . Fall from ladder 10/30/2013  . Acute urinary retention 10/30/2013  . Open fracture of tibial plafond with fibula involvement 10/24/2013    Reginal Lutes, SPT 06/03/2014 4:38 PM  [place] [facility] [address] [place], [place], [address] Phone: [phone]   Fax:  [phone]

## 2014-06-09 ENCOUNTER — Encounter: Payer: Self-pay | Admitting: Physical Therapy

## 2014-06-09 ENCOUNTER — Ambulatory Visit: Payer: Medicare Other | Admitting: Physical Therapy

## 2014-06-09 DIAGNOSIS — R269 Unspecified abnormalities of gait and mobility: Secondary | ICD-10-CM

## 2014-06-09 DIAGNOSIS — M7989 Other specified soft tissue disorders: Secondary | ICD-10-CM | POA: Diagnosis not present

## 2014-06-09 DIAGNOSIS — R531 Weakness: Secondary | ICD-10-CM | POA: Diagnosis not present

## 2014-06-09 DIAGNOSIS — R6889 Other general symptoms and signs: Secondary | ICD-10-CM | POA: Diagnosis not present

## 2014-06-09 DIAGNOSIS — M25571 Pain in right ankle and joints of right foot: Secondary | ICD-10-CM | POA: Diagnosis not present

## 2014-06-09 NOTE — Therapy (Signed)
[facility] [address], [place], [place], [address] Phone: [phone]   Fax:  [phone]  Physical Therapy Treatment  Patient Details  Name: Kevin Kim MRN: [id number] Date of Birth: February 17, 1945 Referring Provider:  Lajean Manes, MD  Encounter Date: 06/09/2014      PT End of Session - 06/09/14 1211    Visit Number 13   Number of Visits 20   Date for PT Re-Evaluation 07/26/14   PT Start Time 1143   PT Stop Time 1243   PT Time Calculation (min) 60 min   Activity Tolerance Patient tolerated treatment well   Behavior During Therapy [facility] for tasks assessed/performed      Past Medical History  Diagnosis Date  . GERD (gastroesophageal reflux disease)   . Irregular heartbeat     1980's.  . Pneumonia     last time 1993  . Anxiety     while in hospital  . Depression     Past Surgical History  Procedure Laterality Date  . I&d extremity Right 10/24/2013    Procedure: IRRIGATION AND DEBRIDEMENT OPEN RIGHT TIBIA/FIBULA FRACTURE;  Surgeon: Mauri Pole, MD;  Location: [place];  Service: Orthopedics;  Laterality: Right;  . External fixation leg Right 10/24/2013    Procedure: EXTERNAL FIXATION RIGHT LOWER LEG;  Surgeon: Mauri Pole, MD;  Location: [place];  Service: Orthopedics;  Laterality: Right;  . Closed reduction tibia Right 10/24/2013    Procedure: CLOSED REDUCTION TIBIA/FIBULA FRACTURE;  Surgeon: Mauri Pole, MD;  Location: [place];  Service: Orthopedics;  Laterality: Right;  . External fixation leg Right 10/27/2013    Procedure:   REVISION OF  EXTERNAL FIXATOR RIGHT LOWER LEG   ORIF  RIGHT TIBIAL PILON APPLICATION OF WOUND VAC;  Surgeon: Rozanna Box, MD;  Location: [place];  Service: Orthopedics;  Laterality: Right;  . Colonoscopy    . Hernia repair Bilateral [id number]    plus umbilicial-   . Orif tibia fracture Right 01/2014  . Cosmetic surgery Right     at [facility] 11/15/2013    left wrist to right  ankle   . Orif tibia fracture Right 01/19/2014    Procedure: OPEN REDUCTION INTERNAL FIXATION (ORIF) TIBIA/FIBULA FRACTURE REPAIR OF TIBIA/FIBULA NON UNION;  Surgeon: Rozanna Box, MD;  Location: [place];  Service: Orthopedics;  Laterality: Right;    There were no vitals filed for this visit.  Visit Diagnosis:  Weakness generalized  Abnormality of gait  Pain in joint, ankle and foot, right  Activity intolerance  Swelling of limb      Subjective Assessment - 06/09/14 1202    Subjective Pt had to sit for long period of time traveling in the car, what made the knee feel stiff   Currently in Pain? Yes   Pain Score 1    Pain Location Ankle   Pain Orientation Right   Pain Descriptors / Indicators Nagging;Dull  pain with weightbearing activities   Pain Type Chronic pain   Pain Onset More than a month ago   Pain Frequency Intermittent   Aggravating Factors  weightbearing activities as standing and walking   Pain Relieving Factors sitting, ice, aspirin   Effect of Pain on Daily Activities unable to walk for exercise   Multiple Pain Sites No  PT Short Term Goals - 06/09/14 1215    PT SHORT TERM GOAL #1   Title be indepenent with initial HEP   Time 4   Period Weeks   Status Achieved   PT SHORT TERM GOAL #2   Title wean from walker to use of cane for home distances > or = to 50% of the time   Time 4   Period Weeks   Status Achieved   PT SHORT TERM GOAL #3   Title walk for 20-25 minutes without need to rest   Time 4   Period Weeks   Status On-going   PT SHORT TERM GOAL #4   Title walk with step-through gait pattern on level surface for all distances   Time 4   Period Weeks   Status Achieved           PT Long Term Goals - 06/09/14 1217    PT LONG TERM GOAL #1   Title be independent in advanced HEP   Time 8   Period Weeks   Status On-going   PT LONG TERM GOAL #2   Title reduce FOTO to < or = to 43%  limitation   Time 8   Period Weeks   Status Partially Met   PT LONG TERM GOAL #3   Title wean from walker to use of cane > or = to 50-75% of the time in the community   Time 8   Period Weeks   Status Achieved   PT LONG TERM GOAL #4   Title walk for exercise > or= to 30 minutes with use of cane   Time 8   Period Weeks   Status On-going   PT LONG TERM GOAL #5   Title report < or = to 3/10 Rt foot pain with standing and walking long periods   Time 8   Period Weeks   Status Achieved   PT LONG TERM GOAL #6   Title wean from cane to no device > or = to 50% of the time with safety   Time 6   Period Weeks   Status On-going               Plan - 06/09/14 1213    Clinical Impression Statement Pt is progressing with strength and endurance and progressing toward all LTG. He will continue to benefit from skilled PT   Pt will benefit from skilled therapeutic intervention in order to improve on the following deficits Abnormal gait;Decreased range of motion;Difficulty walking;Impaired flexibility;Increased edema;Decreased endurance;Decreased activity tolerance;Pain;Decreased mobility;Decreased strength   Rehab Potential Good   PT Frequency 2x / week   PT Duration 6 weeks   PT Treatment/Interventions ADLs/Self Care Home Management;Therapeutic activities;Scar mobilization;Therapeutic exercise;Passive range of motion;Manual techniques;Gait training;Cryotherapy;Neuromuscular re-education;Electrical Stimulation;Other (comment)   PT Next Visit Plan Continue ambulation practice without SPC with visual cues/obstacle course, pelvic tilts, side stepping with theraband, ellpitical at 7 min    Consulted and Agree with Plan of Care Patient        Problem List Patient Active Problem List   Diagnosis Date Noted  . 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 Anxiety   . Depression   . Fracture of distal end of right tibia 01/19/2014  .  GERD (gastroesophageal reflux disease) 10/30/2013  . Fall from ladder 10/30/2013  . Acute urinary retention 10/30/2013  . Open fracture of tibial plafond with fibula involvement 10/24/2013  NAUMANN-HOUEGNIFIO,Wyndell Cardiff PTA 06/09/2014, 12:29 PM  [place] [facility] [address], [place], [place], [address] Phone: [phone]   Fax:  [phone]

## 2014-06-11 ENCOUNTER — Ambulatory Visit: Payer: Medicare Other | Admitting: Physical Therapy

## 2014-06-11 ENCOUNTER — Encounter: Payer: Self-pay | Admitting: Physical Therapy

## 2014-06-11 DIAGNOSIS — R6889 Other general symptoms and signs: Secondary | ICD-10-CM

## 2014-06-11 DIAGNOSIS — M25571 Pain in right ankle and joints of right foot: Secondary | ICD-10-CM | POA: Diagnosis not present

## 2014-06-11 DIAGNOSIS — M7989 Other specified soft tissue disorders: Secondary | ICD-10-CM | POA: Diagnosis not present

## 2014-06-11 DIAGNOSIS — R269 Unspecified abnormalities of gait and mobility: Secondary | ICD-10-CM

## 2014-06-11 DIAGNOSIS — R531 Weakness: Secondary | ICD-10-CM

## 2014-06-11 NOTE — Therapy (Signed)
[facility] [address] [place], [place], [address] Phone: [phone]   Fax:  [phone]  Physical Therapy Treatment  Patient Details  Name: Kevin Kim MRN: [id number] Date of Birth: 02-01-1945 Referring Provider:  Lajean Manes, MD  Encounter Date: 06/11/2014      PT End of Session - 06/11/14 1046    Visit Number 14   Number of Visits 20   Date for PT Re-Evaluation 07/26/14   PT Start Time 0630   PT Stop Time 1117   PT Time Calculation (min) 62 min   Activity Tolerance Patient tolerated treatment well   Behavior During Therapy [facility] for tasks assessed/performed      Past Medical History  Diagnosis Date  . GERD (gastroesophageal reflux disease)   . Irregular heartbeat     1980's.  . Pneumonia     last time 1993  . Anxiety     while in hospital  . Depression     Past Surgical History  Procedure Laterality Date  . I&d extremity Right 10/24/2013    Procedure: IRRIGATION AND DEBRIDEMENT OPEN RIGHT TIBIA/FIBULA FRACTURE;  Surgeon: Mauri Pole, MD;  Location: [place];  Service: Orthopedics;  Laterality: Right;  . External fixation leg Right 10/24/2013    Procedure: EXTERNAL FIXATION RIGHT LOWER LEG;  Surgeon: Mauri Pole, MD;  Location: [place];  Service: Orthopedics;  Laterality: Right;  . Closed reduction tibia Right 10/24/2013    Procedure: CLOSED REDUCTION TIBIA/FIBULA FRACTURE;  Surgeon: Mauri Pole, MD;  Location: [place];  Service: Orthopedics;  Laterality: Right;  . External fixation leg Right 10/27/2013    Procedure:   REVISION OF  EXTERNAL FIXATOR RIGHT LOWER LEG   ORIF  RIGHT TIBIAL PILON APPLICATION OF WOUND VAC;  Surgeon: Rozanna Box, MD;  Location: [place];  Service: Orthopedics;  Laterality: Right;  . Colonoscopy    . Hernia repair Bilateral [id number]    plus umbilicial-   . Orif tibia fracture Right 01/2014  . Cosmetic surgery Right     at [facility] 11/15/2013    left wrist to right  ankle   . Orif tibia fracture Right 01/19/2014    Procedure: OPEN REDUCTION INTERNAL FIXATION (ORIF) TIBIA/FIBULA FRACTURE REPAIR OF TIBIA/FIBULA NON UNION;  Surgeon: Rozanna Box, MD;  Location: [place];  Service: Orthopedics;  Laterality: Right;    There were no vitals filed for this visit.  Visit Diagnosis:  Weakness generalized  Abnormality of gait  Pain in joint, ankle and foot, right  Activity intolerance  Swelling of limb      Subjective Assessment - 06/11/14 1032    Subjective Pt reports was able to walk in the garden and walk on uneven ground   Limitations Standing;Walking   Currently in Pain? Yes   Pain Score 1    Pain Location Ankle   Pain Orientation Right   Pain Descriptors / Indicators Nagging;Dull   Pain Type Chronic pain   Pain Onset More than a month ago   Pain Frequency Intermittent   Multiple Pain Sites No                         [facility] Adult PT Treatment/Exercise - 06/11/14 0001    Lumbar Exercises: Standing   Other Standing Lumbar Exercises Practiced ambulation without Assistive device on level surface forward /backward sidestepping left and right  with close supervision for safety   Knee/Hip Exercises: Aerobic  Stationary Bike L3 x 72mn 3.524mes   Elliptical incline2/resistance 1 x 64m21m   Knee/Hip Exercises: Standing   Lateral Step Up 3 sets;10 reps;Right;Hand Hold: 1   Forward Step Up 3 sets;10 reps;Right;Hand Hold: 1   SLS SLSx3 on trampoline 20sec with 1 UE support; and 3 directions x1mi72m Modalities   Modalities Vasopneumatic   Vasopneumatic   Number Minutes Vasopneumatic  15 minutes   Vasopnuematic Location  Ankle   Vasopneumatic Pressure Medium   Vasopneumatic Temperature  3 snowflakes   Ankle Exercises: Machines for Strengthening   Cybex Leg Press St 8 100# 3x10 with Bil LE; Rt LE 50# 3x10                  PT Short Term Goals - 06/09/14 1215    PT SHORT TERM GOAL #1   Title be indepenent with initial  HEP   Time 4   Period Weeks   Status Achieved   PT SHORT TERM GOAL #2   Title wean from walker to use of cane for home distances > or = to 50% of the time   Time 4   Period Weeks   Status Achieved   PT SHORT TERM GOAL #3   Title walk for 20-25 minutes without need to rest   Time 4   Period Weeks   Status On-going   PT SHORT TERM GOAL #4   Title walk with step-through gait pattern on level surface for all distances   Time 4   Period Weeks   Status Achieved           PT Long Term Goals - 06/09/14 1217    PT LONG TERM GOAL #1   Title be independent in advanced HEP   Time 8   Period Weeks   Status On-going   PT LONG TERM GOAL #2   Title reduce FOTO to < or = to 43% limitation   Time 8   Period Weeks   Status Partially Met   PT LONG TERM GOAL #3   Title wean from walker to use of cane > or = to 50-75% of the time in the community   Time 8   Period Weeks   Status Achieved   PT LONG TERM GOAL #4   Title walk for exercise > or= to 30 minutes with use of cane   Time 8   Period Weeks   Status On-going   PT LONG TERM GOAL #5   Title report < or = to 3/10 Rt foot pain with standing and walking long periods   Time 8   Period Weeks   Status Achieved   PT LONG TERM GOAL #6   Title wean from cane to no device > or = to 50% of the time with safety   Time 6   Period Weeks   Status On-going               Plan - 06/11/14 1047    Clinical Impression Statement Pt reports sometimes forgetting his cane for short distances, and able to walk on uneven surfaces with SPC   Pt will benefit from skilled therapeutic intervention in order to improve on the following deficits Abnormal gait;Decreased range of motion;Difficulty walking;Impaired flexibility;Increased edema;Decreased endurance;Decreased activity tolerance;Pain;Decreased mobility;Decreased strength   Rehab Potential Good   PT Frequency 2x / week   PT Duration 6 weeks   PT Treatment/Interventions ADLs/Self Care Home  Management;Therapeutic activities;Scar mobilization;Therapeutic exercise;Passive range of motion;Manual  techniques;Gait training;Cryotherapy;Neuromuscular re-education;Electrical Stimulation;Other (comment)   PT Next Visit Plan Continue ambulation practice without SPC with visual cues/obstacle course, pelvic tilts, side stepping with theraband, ellpitical at 7 min    Consulted and Agree with Plan of Care Patient        Problem List Patient Active Problem List   Diagnosis Date Noted  . 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 Anxiety   . Depression   . Fracture of distal end of right tibia 01/19/2014  . GERD (gastroesophageal reflux disease) 10/30/2013  . Fall from ladder 10/30/2013  . Acute urinary retention 10/30/2013  . Open fracture of tibial plafond with fibula involvement 10/24/2013    NAUMANN-HOUEGNIFIO,Bobie Caris PTA 06/11/2014, 10:59 AM  [facility] [address], [place], [place], [address] Phone: [phone]   Fax:  [phone]

## 2014-06-15 ENCOUNTER — Ambulatory Visit: Payer: Medicare Other

## 2014-06-15 DIAGNOSIS — R531 Weakness: Secondary | ICD-10-CM | POA: Diagnosis not present

## 2014-06-15 DIAGNOSIS — R6889 Other general symptoms and signs: Secondary | ICD-10-CM | POA: Diagnosis not present

## 2014-06-15 DIAGNOSIS — M25571 Pain in right ankle and joints of right foot: Secondary | ICD-10-CM

## 2014-06-15 DIAGNOSIS — M7989 Other specified soft tissue disorders: Secondary | ICD-10-CM | POA: Diagnosis not present

## 2014-06-15 DIAGNOSIS — R269 Unspecified abnormalities of gait and mobility: Secondary | ICD-10-CM | POA: Diagnosis not present

## 2014-06-15 NOTE — Therapy (Signed)
[facility] [address], [place], [place], [address] Phone: [phone]   Fax:  [phone]  Physical Therapy Treatment  Patient Details  Name: Kevin Kim MRN: [id number] Date of Birth: 07/05/1945 Referring Provider:  Lajean Manes, MD  Encounter Date: 06/15/2014      PT End of Session - 06/15/14 1154    Visit Number 15   Number of Visits 20   Date for PT Re-Evaluation 07/26/14   PT Start Time 6834   PT Stop Time 1244   PT Time Calculation (min) 65 min   Activity Tolerance Patient tolerated treatment well   Behavior During Therapy [facility] for tasks assessed/performed      Past Medical History  Diagnosis Date  . GERD (gastroesophageal reflux disease)   . Irregular heartbeat     1980's.  . Pneumonia     last time 1993  . Anxiety     while in hospital  . Depression     Past Surgical History  Procedure Laterality Date  . I&d extremity Right 10/24/2013    Procedure: IRRIGATION AND DEBRIDEMENT OPEN RIGHT TIBIA/FIBULA FRACTURE;  Surgeon: Mauri Pole, MD;  Location: [place];  Service: Orthopedics;  Laterality: Right;  . External fixation leg Right 10/24/2013    Procedure: EXTERNAL FIXATION RIGHT LOWER LEG;  Surgeon: Mauri Pole, MD;  Location: [place];  Service: Orthopedics;  Laterality: Right;  . Closed reduction tibia Right 10/24/2013    Procedure: CLOSED REDUCTION TIBIA/FIBULA FRACTURE;  Surgeon: Mauri Pole, MD;  Location: [place];  Service: Orthopedics;  Laterality: Right;  . External fixation leg Right 10/27/2013    Procedure:   REVISION OF  EXTERNAL FIXATOR RIGHT LOWER LEG   ORIF  RIGHT TIBIAL PILON APPLICATION OF WOUND VAC;  Surgeon: Rozanna Box, MD;  Location: [place];  Service: Orthopedics;  Laterality: Right;  . Colonoscopy    . Hernia repair Bilateral [id number]    plus umbilicial-   . Orif tibia fracture Right 01/2014  . Cosmetic surgery Right     at [facility] 11/15/2013    left wrist to right  ankle   . Orif tibia fracture Right 01/19/2014    Procedure: OPEN REDUCTION INTERNAL FIXATION (ORIF) TIBIA/FIBULA FRACTURE REPAIR OF TIBIA/FIBULA NON UNION;  Surgeon: Rozanna Box, MD;  Location: [place];  Service: Orthopedics;  Laterality: Right;    There were no vitals filed for this visit.  Visit Diagnosis:  Weakness generalized  Pain in joint, ankle and foot, right  Abnormality of gait  Activity intolerance      Subjective Assessment - 06/15/14 1148    Subjective Able to stand for 45 minutes without rest at [facility]. Able to do yardwork last week 1 hour. 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 Able to walk more inside the house without the cane.    How long can you stand comfortably? 45 minutes in line at [facility]    How long can you walk comfortably? Able to walk 1 mile - 20 minutes; with the cane;    Diagnostic tests x-ray to determine healing   Currently in Pain? No/denies                         [facility] Adult PT Treatment/Exercise - 06/15/14 0001    Knee/Hip Exercises: Aerobic   Stationary Bike L3 x 60min 3.71miles   Elliptical incline2/resistance 1 x 77min    Knee/Hip Exercises: Standing   Heel Raises 2  sets;10 reps  blue foam mat   Lateral Step Up 3 sets;10 reps;Right;Hand Hold: 1;Step Height: 8"  8 in step    Forward Step Up 3 sets;10 reps;Right;Hand Hold: 1;Step Height: 8"  8 in step    SLS SLSx3 on trampoline 30sec with 1 UE support   Vasopneumatic   Number Minutes Vasopneumatic  15 minutes   Vasopnuematic Location  Ankle   Vasopneumatic Pressure Medium   Vasopneumatic Temperature  3 snowflakes   Ankle Exercises: Standing   SLS --   Rocker Board 2 minutes  PF/DF; inv/ev   Ankle Exercises: Machines for Strengthening   Cybex Leg Press St 8 105# 3x10 with Bil LE; Rt LE 55# 3x10                PT Education - 06/15/14 1246    Education provided Yes   Education Details HEP: heel raises, walking program   Person(s) Educated Patient   Methods  Explanation;Demonstration;Handout   Comprehension Verbalized understanding;Returned demonstration          PT Short Term Goals - 06/09/14 1215    PT SHORT TERM GOAL #1   Title be indepenent with initial HEP   Time 4   Period Weeks   Status Achieved   PT SHORT TERM GOAL #2   Title wean from walker to use of cane for home distances > or = to 50% of the time   Time 4   Period Weeks   Status Achieved   PT SHORT TERM GOAL #3   Title walk for 20-25 minutes without need to rest   Time 4   Period Weeks   Status On-going   PT SHORT TERM GOAL #4   Title walk with step-through gait pattern on level surface for all distances   Time 4   Period Weeks   Status Achieved           PT Long Term Goals - 06/15/14 1209    PT LONG TERM GOAL #4   Title walk for exercise > or= to 30 minutes with use of cane  Has not attempted to walk outside; able to do yardwork (pulling weeds)  for an hour  with use of cane    Time 8   Period Weeks   Status On-going   PT LONG TERM GOAL #6   Title wean from cane to no device > or = to 50% of the time with safety  Does not use cane about 75% in the house on level surface;   Time 8   Period Weeks   Status Achieved               Plan - 06/15/14 1219    Clinical Impression Statement Pt has increased endurance for outdoor activities and errands (i.e. grocery shopping, waiting in line at [facility]). Only requires SPC when in community/walking on uneven surfaces. Still notes increased swelling at Rt. ankle at the end of the day. Able to progress strengthening exercises and proprioceptive exercises without need for rest.  Will require skilled PT  for continued balance, proprioception and strength training.   Pt will benefit from skilled therapeutic intervention in order to improve on the following deficits Abnormal gait;Decreased range of motion;Difficulty walking;Impaired flexibility;Increased edema;Decreased endurance;Decreased activity  tolerance;Pain;Decreased mobility;Decreased strength   PT Frequency 2x / week   PT Duration 6 weeks   PT Treatment/Interventions ADLs/Self Care Home Management;Therapeutic activities;Scar mobilization;Therapeutic exercise;Passive range of motion;Manual techniques;Gait training;Cryotherapy;Neuromuscular re-education;Electrical Stimulation;Other (comment);Patient/family education   PT  Next Visit Plan Try squats on foam mat, elliptical for 7 minutes; lateral walking/braiding without cane for balance    Consulted and Agree with Plan of Care Patient        Problem List Patient Active Problem List   Diagnosis Date Noted  . 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 Anxiety   . Depression   . Fracture of distal end of right tibia 01/19/2014  . GERD (gastroesophageal reflux disease) 10/30/2013  . Fall from ladder 10/30/2013  . Acute urinary retention 10/30/2013  . Open fracture of tibial plafond with fibula involvement 10/24/2013    Reginal Lutes, SPT 06/15/2014 1:14 PM   During this treatment session, the therapist was present, participating in, and directing the treatment. TAKACS,KELLY 06/15/2014, 1:14 PM  [facility] [address] [place], [place], [address] Phone: [phone]   Fax:  [phone]

## 2014-06-15 NOTE — Patient Instructions (Addendum)
Heel Raise: Bilateral (Standing)   Rise on balls of feet. Do next to kitchen counter. Repeat __20__ times per set. Do _2___ sets per session. Do _2___ sessions per day.  http://orth.exer.us/38   Copyright  VHI. All rights reserved.  WALKING  Walking is a great form of exercise to increase your strength, endurance and overall fitness.  A walking program can help you start slowly and gradually build endurance as you go.  Everyone's ability is different, so each person's starting point will be different.  You do not have to follow them exactly.  The are just samples. You should simply find out what's right for you and stick to that program.   In the beginning, you'll start off walking 2-3 times a day for short distances.  As you get stronger, you'll be walking further at just 1-2 times per day.  A. You Can Walk For A Certain Length Of Time Each Day    Walk 5 minutes 3 times per day.  Increase 2 minutes every 2 days (3 times per day).  Work up to 25-30 minutes (1-2 times per day).   Example:   Day 1-2 5 minutes 3 times per day   Day 7-8 12 minutes 2-3 times per day   Day 13-14 25 minutes 1-2 times per day  B. You Can Walk For a Certain Distance Each Day     Distance can be substituted for time.    Example:   3 trips to mailbox (at road)   3 trips to corner of block   3 trips around the block  C. Go to local [employer] and use the track.    Walk for distance ____ around track  Or time ____ minutes  D. Walk ____ Jog ____ Run ___

## 2014-06-17 ENCOUNTER — Encounter: Payer: Federal, State, Local not specified - PPO | Admitting: Physical Therapy

## 2014-06-22 ENCOUNTER — Ambulatory Visit: Payer: Medicare Other | Admitting: Physical Therapy

## 2014-06-22 ENCOUNTER — Encounter: Payer: Self-pay | Admitting: Physical Therapy

## 2014-06-22 DIAGNOSIS — R269 Unspecified abnormalities of gait and mobility: Secondary | ICD-10-CM | POA: Diagnosis not present

## 2014-06-22 DIAGNOSIS — R6889 Other general symptoms and signs: Secondary | ICD-10-CM | POA: Diagnosis not present

## 2014-06-22 DIAGNOSIS — M7989 Other specified soft tissue disorders: Secondary | ICD-10-CM | POA: Diagnosis not present

## 2014-06-22 DIAGNOSIS — R531 Weakness: Secondary | ICD-10-CM

## 2014-06-22 DIAGNOSIS — M25571 Pain in right ankle and joints of right foot: Secondary | ICD-10-CM

## 2014-06-22 NOTE — Therapy (Signed)
[facility] [address] [place], [place], [address] Phone: [phone]   Fax:  [phone]  Physical Therapy Treatment  Patient Details  Name: Kevin Kim MRN: [id number] Date of Birth: 1945-03-25 Referring Provider:  Lajean Manes, MD  Encounter Date: 06/22/2014      PT End of Session - 06/22/14 1216    Visit Number 16   Number of Visits 20   Date for PT Re-Evaluation 07/26/14   PT Start Time 1138   PT Stop Time 1246   PT Time Calculation (min) 68 min   Activity Tolerance Patient tolerated treatment well   Behavior During Therapy [facility] for tasks assessed/performed      Past Medical History  Diagnosis Date  . GERD (gastroesophageal reflux disease)   . Irregular heartbeat     1980's.  . Pneumonia     last time 1993  . Anxiety     while in hospital  . Depression     Past Surgical History  Procedure Laterality Date  . I&d extremity Right 10/24/2013    Procedure: IRRIGATION AND DEBRIDEMENT OPEN RIGHT TIBIA/FIBULA FRACTURE;  Surgeon: Mauri Pole, MD;  Location: [place];  Service: Orthopedics;  Laterality: Right;  . External fixation leg Right 10/24/2013    Procedure: EXTERNAL FIXATION RIGHT LOWER LEG;  Surgeon: Mauri Pole, MD;  Location: [place];  Service: Orthopedics;  Laterality: Right;  . Closed reduction tibia Right 10/24/2013    Procedure: CLOSED REDUCTION TIBIA/FIBULA FRACTURE;  Surgeon: Mauri Pole, MD;  Location: [place];  Service: Orthopedics;  Laterality: Right;  . External fixation leg Right 10/27/2013    Procedure:   REVISION OF  EXTERNAL FIXATOR RIGHT LOWER LEG   ORIF  RIGHT TIBIAL PILON APPLICATION OF WOUND VAC;  Surgeon: Rozanna Box, MD;  Location: [place];  Service: Orthopedics;  Laterality: Right;  . Colonoscopy    . Hernia repair Bilateral [id number]    plus umbilicial-   . Orif tibia fracture Right 01/2014  . Cosmetic surgery Right     at [facility] 11/15/2013    left wrist to right  ankle   . Orif tibia fracture Right 01/19/2014    Procedure: OPEN REDUCTION INTERNAL FIXATION (ORIF) TIBIA/FIBULA FRACTURE REPAIR OF TIBIA/FIBULA NON UNION;  Surgeon: Rozanna Box, MD;  Location: [place];  Service: Orthopedics;  Laterality: Right;    There were no vitals filed for this visit.  Visit Diagnosis:  Weakness generalized  Pain in joint, ankle and foot, right  Abnormality of gait  Activity intolerance  Swelling of limb      Subjective Assessment - 06/22/14 1155    Subjective Pt will see Podiatrist today due to ingrown nail on big toe on Rt foot. Pt reports was able to walk around [place] for 82mn, but having shopping cart   Pertinent [facility].  [facility]  Home 12/16/13.  Back in hospital 01/2014 for plate on Rt.  Pt was [facility] until 03/10/14.                         [facility] PT Treatment/Exercise - 06/22/14 0001    Knee/Hip Exercises: Aerobic   Stationary Bike L3 x 154m 3.60m40ms   Elliptical incline2/resistance 1 x 7mi39m  Knee/Hip Exercises: Standing   Heel Raises 2 sets;10 reps  standing on blue  balance pad   Lateral Step Up 3 sets  8' box   Forward Step Up 3 sets;10 reps;Right;Hand Hold: 1;Step Height: 8"  8' box   SLS SLSx3 on trampoline 30sec with 1 UE support   Modalities   Modalities Vasopneumatic   Vasopneumatic   Number Minutes Vasopneumatic  15 minutes   Vasopnuematic Location  Ankle   Vasopneumatic Pressure Medium   Vasopneumatic Temperature  3 snowflakes   Ankle Exercises: Machines for Strengthening   Cybex Leg Press St 7 105# 3x10 with Bil LE; Rt LE 55# 3x10                  PT Short Term Goals - 06/09/14 1215    PT SHORT TERM GOAL #1   Title be indepenent with initial HEP   Time 4   Period Weeks   Status Achieved   PT SHORT TERM GOAL #2   Title wean from walker to use of cane for home distances > or = to 50%  of the time   Time 4   Period Weeks   Status Achieved   PT SHORT TERM GOAL #3   Title walk for 20-25 minutes without need to rest   Time 4   Period Weeks   Status On-going   PT SHORT TERM GOAL #4   Title walk with step-through gait pattern on level surface for all distances   Time 4   Period Weeks   Status Achieved           PT Long Term Goals - 06/22/14 1204    PT LONG TERM GOAL #1   Title be independent in advanced HEP   Time 8   Period Weeks   Status On-going   PT LONG TERM GOAL #2   Title reduce FOTO to < or = to 43% limitation   Time 8   Period Weeks   Status Partially Met   PT LONG TERM GOAL #3   Title wean from walker to use of cane > or = to 50-75% of the time in the community   Time 8   Period Weeks   Status Achieved   PT LONG TERM GOAL #4   Title walk for exercise > or= to 30 minutes with use of cane   Time 8   Period Weeks   Status On-going   PT LONG TERM GOAL #5   Title report < or = to 3/10 Rt foot pain with standing and walking long periods   Time 8   Period Weeks   Status Achieved   PT LONG TERM GOAL #6   Title wean from cane to no device > or = to 50% of the time with safety   Time 8   Period Weeks   Status Achieved               Plan - 06/22/14 1217    Clinical Impression Statement Pt reports 80% of the time not using cane at his house and may walk to his truck without it at times. Pt with improved endurance and activity tolerance as now able to step up 8' box   Pt will benefit from skilled therapeutic intervention in order to improve on the following deficits Abnormal gait;Decreased range of motion;Difficulty walking;Impaired flexibility;Increased edema;Decreased endurance;Decreased activity tolerance;Pain;Decreased mobility;Decreased strength   Rehab Potential Good   PT Frequency 2x / week   PT Duration 6 weeks   PT Treatment/Interventions ADLs/Self Care Home Management;Therapeutic activities;Scar mobilization;Therapeutic  exercise;Passive  range of motion;Manual techniques;Gait training;Cryotherapy;Neuromuscular re-education;Electrical Stimulation;Other (comment);Patient/family education   PT Next Visit Plan Try squats on foam mat, lateral walking/braiding without cane for balance    Consulted and Agree with Plan of Care Patient        Problem List Patient Active Problem List   Diagnosis Date Noted  . 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 Anxiety   . Depression   . Fracture of distal end of right tibia 01/19/2014  . GERD (gastroesophageal reflux disease) 10/30/2013  . Fall from ladder 10/30/2013  . Acute urinary retention 10/30/2013  . Open fracture of tibial plafond with fibula involvement 10/24/2013    NAUMANN-HOUEGNIFIO,Parris Signer PTA 06/22/2014, 12:23 PM   [facility] [address], [place], [place], [address] Phone: [phone]   Fax:  [phone]

## 2014-06-23 DIAGNOSIS — M779 Enthesopathy, unspecified: Secondary | ICD-10-CM | POA: Diagnosis not present

## 2014-06-23 DIAGNOSIS — L600 Ingrowing nail: Secondary | ICD-10-CM | POA: Diagnosis not present

## 2014-06-24 ENCOUNTER — Ambulatory Visit: Payer: Medicare Other | Admitting: Physical Therapy

## 2014-06-29 ENCOUNTER — Ambulatory Visit: Payer: Medicare Other | Admitting: Physical Therapy

## 2014-06-29 ENCOUNTER — Encounter: Payer: Self-pay | Admitting: Physical Therapy

## 2014-06-29 DIAGNOSIS — R6889 Other general symptoms and signs: Secondary | ICD-10-CM

## 2014-06-29 DIAGNOSIS — R269 Unspecified abnormalities of gait and mobility: Secondary | ICD-10-CM | POA: Diagnosis not present

## 2014-06-29 DIAGNOSIS — M7989 Other specified soft tissue disorders: Secondary | ICD-10-CM | POA: Diagnosis not present

## 2014-06-29 DIAGNOSIS — R531 Weakness: Secondary | ICD-10-CM

## 2014-06-29 DIAGNOSIS — M25571 Pain in right ankle and joints of right foot: Secondary | ICD-10-CM | POA: Diagnosis not present

## 2014-06-29 NOTE — Therapy (Signed)
[facility] [address], [place], [place], [address] Phone: [phone]   Fax:  [phone]  Physical Therapy Treatment  Patient Details  Name: Kevin Kim MRN: [id number] Date of Birth: November 23, 1945 Referring Provider:  Lajean Manes, MD  Encounter Date: 06/29/2014      PT End of Session - 06/29/14 1147    Visit Number 17   Number of Visits 20   Date for PT Re-Evaluation 07/26/14   PT Start Time 1050   PT Stop Time 1135   PT Time Calculation (min) 45 min   Activity Tolerance Patient tolerated treatment well   Behavior During Therapy [facility] for tasks assessed/performed      Past Medical History  Diagnosis Date  . GERD (gastroesophageal reflux disease)   . Irregular heartbeat     1980's.  . Pneumonia     last time 1993  . Anxiety     while in hospital  . Depression     Past Surgical History  Procedure Laterality Date  . I&d extremity Right 10/24/2013    Procedure: IRRIGATION AND DEBRIDEMENT OPEN RIGHT TIBIA/FIBULA FRACTURE;  Surgeon: Mauri Pole, MD;  Location: [place];  Service: Orthopedics;  Laterality: Right;  . External fixation leg Right 10/24/2013    Procedure: EXTERNAL FIXATION RIGHT LOWER LEG;  Surgeon: Mauri Pole, MD;  Location: [place];  Service: Orthopedics;  Laterality: Right;  . Closed reduction tibia Right 10/24/2013    Procedure: CLOSED REDUCTION TIBIA/FIBULA FRACTURE;  Surgeon: Mauri Pole, MD;  Location: [place];  Service: Orthopedics;  Laterality: Right;  . External fixation leg Right 10/27/2013    Procedure:   REVISION OF  EXTERNAL FIXATOR RIGHT LOWER LEG   ORIF  RIGHT TIBIAL PILON APPLICATION OF WOUND VAC;  Surgeon: Rozanna Box, MD;  Location: [place];  Service: Orthopedics;  Laterality: Right;  . Colonoscopy    . Hernia repair Bilateral [id number]    plus umbilicial-   . Orif tibia fracture Right 01/2014  . Cosmetic surgery Right     at [facility] 11/15/2013    left wrist to right  ankle   . Orif tibia fracture Right 01/19/2014    Procedure: OPEN REDUCTION INTERNAL FIXATION (ORIF) TIBIA/FIBULA FRACTURE REPAIR OF TIBIA/FIBULA NON UNION;  Surgeon: Rozanna Box, MD;  Location: [place];  Service: Orthopedics;  Laterality: Right;    There were no vitals filed for this visit.  Visit Diagnosis:  Weakness generalized  Pain in joint, ankle and foot, right  Abnormality of gait  Activity intolerance      Subjective Assessment - 06/29/14 1128    Subjective Pt saw Podiatrist last week wednesday to remove ingrown big toe nail, he was asked to skip PT appointment for last Thursday   Currently in Pain? No/denies                         [facility] Adult PT Treatment/Exercise - 06/29/14 0001    Lumbar Exercises: Machines for Strengthening   Cybex Knee Extension Rt LE 10#, B LE 20#, each 3 x 10   Cybex Knee Flexion 35# B LE, 25# Rt LE, 3 x 10 each   Knee/Hip Exercises: Aerobic   Stationary Bike L3 x 21mn 498mes   Elliptical skipped due to toe nail    Knee/Hip Exercises: Prone   Other Prone Exercises SLR hip extension and abduction 3x10  with Rt LE in prone and left sidelying  Modalities   Modalities --  skipped today due to toe nail removal                  PT Short Term Goals - 06/29/14 1151    PT SHORT TERM GOAL #1   Title be indepenent with initial HEP   Time 4   Period Weeks   Status Achieved   PT SHORT TERM GOAL #2   Title wean from walker to use of cane for home distances > or = to 50% of the time   Time 4   Period Weeks   Status Achieved   PT SHORT TERM GOAL #3   Title walk for 20-25 minutes without need to rest   Time 4   Period Weeks   Status On-going   PT SHORT TERM GOAL #4   Title walk with step-through gait pattern on level surface for all distances   Time 4   Period Weeks   Status Achieved           PT Long Term Goals - 06/22/14 1204    PT LONG TERM GOAL #1   Title be independent in advanced HEP   Time 8    Period Weeks   Status On-going   PT LONG TERM GOAL #2   Title reduce FOTO to < or = to 43% limitation   Time 8   Period Weeks   Status Partially Met   PT LONG TERM GOAL #3   Title wean from walker to use of cane > or = to 50-75% of the time in the community   Time 8   Period Weeks   Status Achieved   PT LONG TERM GOAL #4   Title walk for exercise > or= to 30 minutes with use of cane   Time 8   Period Weeks   Status On-going   PT LONG TERM GOAL #5   Title report < or = to 3/10 Rt foot pain with standing and walking long periods   Time 8   Period Weeks   Status Achieved   PT LONG TERM GOAL #6   Title wean from cane to no device > or = to 50% of the time with safety   Time 8   Period Weeks   Status Achieved               Plan - 06/29/14 1149    Clinical Impression Statement Pt reports he had to rest a lot and was unable until today to wear closed shoes, due to toe nail removal. Activities today altered to decrease weightbearing stress on Rt foot, but still able for strengthening    Pt will benefit from skilled therapeutic intervention in order to improve on the following deficits Abnormal gait;Decreased range of motion;Difficulty walking;Impaired flexibility;Increased edema;Decreased endurance;Decreased activity tolerance;Pain;Decreased mobility;Decreased strength   Rehab Potential Good   PT Frequency 2x / week   PT Duration 6 weeks   PT Next Visit Plan Depending on big toe situation may try squats on foam mat, lateral walking/braiding without cane for balance    Consulted and Agree with Plan of Care Patient        Problem List Patient Active Problem List   Diagnosis Date Noted  . 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 Anxiety   . Depression   . Fracture of distal end of right tibia 01/19/2014  . GERD (gastroesophageal reflux disease) 10/30/2013  .  Fall from ladder 10/30/2013  . Acute urinary  retention 10/30/2013  . Open fracture of tibial plafond with fibula involvement 10/24/2013    NAUMANN-HOUEGNIFIO,Kasper Mudrick PTA 06/29/2014, 11:54 AM  [facility] [address], [place], [place], [address] Phone: [phone]   Fax:  [phone]

## 2014-07-01 ENCOUNTER — Encounter: Payer: Self-pay | Admitting: Physical Therapy

## 2014-07-01 ENCOUNTER — Ambulatory Visit: Payer: Medicare Other | Admitting: Physical Therapy

## 2014-07-01 DIAGNOSIS — M25571 Pain in right ankle and joints of right foot: Secondary | ICD-10-CM | POA: Diagnosis not present

## 2014-07-01 DIAGNOSIS — R269 Unspecified abnormalities of gait and mobility: Secondary | ICD-10-CM | POA: Diagnosis not present

## 2014-07-01 DIAGNOSIS — R6889 Other general symptoms and signs: Secondary | ICD-10-CM | POA: Diagnosis not present

## 2014-07-01 DIAGNOSIS — M7989 Other specified soft tissue disorders: Secondary | ICD-10-CM

## 2014-07-01 DIAGNOSIS — R531 Weakness: Secondary | ICD-10-CM

## 2014-07-01 NOTE — Therapy (Signed)
[facility] [address], [place], [place], [address] Phone: [phone]   Fax:  [phone]  Physical Therapy Treatment  Patient Details  Name: Kevin Kim MRN: [id number] Date of Birth: 04-Nov-1945 Referring Provider:  Lajean Manes, MD  Encounter Date: 07/01/2014      PT End of Session - 07/01/14 1248    Visit Number 18   Number of Visits 20   Date for PT Re-Evaluation 07/26/14   PT Start Time 1225   PT Stop Time 1332   PT Time Calculation (min) 67 min   Activity Tolerance Patient tolerated treatment well   Behavior During Therapy [facility] for tasks assessed/performed      Past Medical History  Diagnosis Date  . GERD (gastroesophageal reflux disease)   . Irregular heartbeat     1980's.  . Pneumonia     last time 1993  . Anxiety     while in hospital  . Depression     Past Surgical History  Procedure Laterality Date  . I&d extremity Right 10/24/2013    Procedure: IRRIGATION AND DEBRIDEMENT OPEN RIGHT TIBIA/FIBULA FRACTURE;  Surgeon: Mauri Pole, MD;  Location: [place];  Service: Orthopedics;  Laterality: Right;  . External fixation leg Right 10/24/2013    Procedure: EXTERNAL FIXATION RIGHT LOWER LEG;  Surgeon: Mauri Pole, MD;  Location: [place];  Service: Orthopedics;  Laterality: Right;  . Closed reduction tibia Right 10/24/2013    Procedure: CLOSED REDUCTION TIBIA/FIBULA FRACTURE;  Surgeon: Mauri Pole, MD;  Location: [place];  Service: Orthopedics;  Laterality: Right;  . External fixation leg Right 10/27/2013    Procedure:   REVISION OF  EXTERNAL FIXATOR RIGHT LOWER LEG   ORIF  RIGHT TIBIAL PILON APPLICATION OF WOUND VAC;  Surgeon: Rozanna Box, MD;  Location: [place];  Service: Orthopedics;  Laterality: Right;  . Colonoscopy    . Hernia repair Bilateral [id number]    plus umbilicial-   . Orif tibia fracture Right 01/2014  . Cosmetic surgery Right     at [facility] 11/15/2013    left wrist to right  ankle   . Orif tibia fracture Right 01/19/2014    Procedure: OPEN REDUCTION INTERNAL FIXATION (ORIF) TIBIA/FIBULA FRACTURE REPAIR OF TIBIA/FIBULA NON UNION;  Surgeon: Rozanna Box, MD;  Location: [place];  Service: Orthopedics;  Laterality: Right;    There were no vitals filed for this visit.  Visit Diagnosis:  Weakness generalized  Pain in joint, ankle and foot, right  Abnormality of gait  Activity intolerance  Swelling of limb      Subjective Assessment - 07/01/14 1246    Subjective Pt sas Podiatrist yesterday, he is pleased with the healing from big toe nail removal. Pt has no complains of pain   Pertinent History 9 days at [facility]. External fixator for 12 weeks.  Rehab stay and then plastic surgery at [facility] due to non-healing.  Home 12/16/13.  Back in hospital 01/2014 for plate on Rt.  Pt was [facility] until 03/10/14.   Limitations Standing;Walking   Currently in Pain? No/denies   Multiple Pain Sites No                         [facility] Adult PT Treatment/Exercise - 07/01/14 0001    Lumbar Exercises: Machines for Strengthening   Cybex Knee Extension Rt LE 10#, B LE 20#, each 3 x 10   Cybex Knee Flexion 35# B  LE, 25# Rt LE, 3 x 10 each   Leg Press St# 7 105  3x10   Lumbar Exercises: Standing   Other Standing Lumbar Exercises Ambulation all needed distance in gym without cane today  decreased weigthbearing tolerance on Rt and short step lenth   Knee/Hip Exercises: Aerobic   Stationary Bike L3 x 47mn 419mes   Elliptical incline2/resistance 1 x 70m25m   Knee/Hip Exercises: Standing   SLS SLSx3 on trampoline 20sec with 1 UE support, each leg   Modalities   Modalities Vasopneumatic   Vasopneumatic   Number Minutes Vasopneumatic  15 minutes   Vasopnuematic Location  Ankle   Vasopneumatic Pressure Medium   Vasopneumatic Temperature  3 snowflakes                  PT Short Term Goals - 06/29/14 1151    PT SHORT TERM GOAL #1   Title be indepenent with  initial HEP   Time 4   Period Weeks   Status Achieved   PT SHORT TERM GOAL #2   Title wean from walker to use of cane for home distances > or = to 50% of the time   Time 4   Period Weeks   Status Achieved   PT SHORT TERM GOAL #3   Title walk for 20-25 minutes without need to rest   Time 4   Period Weeks   Status On-going   PT SHORT TERM GOAL #4   Title walk with step-through gait pattern on level surface for all distances   Time 4   Period Weeks   Status Achieved           PT Long Term Goals - 07/01/14 1253    PT LONG TERM GOAL #1   Title be independent in advanced HEP   Time 8   Period Weeks   Status On-going   PT LONG TERM GOAL #2   Title reduce FOTO to < or = to 43% limitation   Time 8   Period Weeks   Status Partially Met   PT LONG TERM GOAL #3   Title wean from walker to use of cane > or = to 50-75% of the time in the community   Time 8   Period Weeks   Status Achieved   PT LONG TERM GOAL #4   Title walk for exercise > or= to 30 minutes with use of cane   Time 8   Period Weeks   Status On-going   PT LONG TERM GOAL #5   Title report < or = to 3/10 Rt foot pain with standing and walking long periods   Time 8   Period Weeks   Status Achieved               Plan - 07/01/14 1249    Clinical Impression Statement Pt was able perform  activities in PT session, without complains of pain in big toe.     Pt will benefit from skilled therapeutic intervention in order to improve on the following deficits Abnormal gait;Decreased range of motion;Difficulty walking;Impaired flexibility;Increased edema;Decreased endurance;Decreased activity tolerance;Pain;Decreased mobility;Decreased strength   Rehab Potential Good   PT Frequency 2x / week   PT Duration 6 weeks   PT Treatment/Interventions ADLs/Self Care Home Management;Scar mobilization;Therapeutic exercise;Passive range of motion;Manual techniques;Gait training;Cryotherapy;Neuromuscular re-education;Electrical  Stimulation;Other (comment);Patient/family education   PT Next Visit Plan Include Elliptical, may try squats on foam mat, lateral walking/braiding without cane for balance    Consulted  and Agree with Plan of Care Patient        Problem List Patient Active Problem List   Diagnosis Date Noted  . 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 Anxiety   . Depression   . Fracture of distal end of right tibia 01/19/2014  . GERD (gastroesophageal reflux disease) 10/30/2013  . Fall from ladder 10/30/2013  . Acute urinary retention 10/30/2013  . Open fracture of tibial plafond with fibula involvement 10/24/2013    NAUMANN-HOUEGNIFIO,Kendrell Lottman PTA 07/01/2014, 1:22 PM  [facility] [address] [place], [place], [address] Phone: [phone]   Fax:  [phone]

## 2014-07-20 ENCOUNTER — Encounter: Payer: Self-pay | Admitting: Physical Therapy

## 2014-07-20 ENCOUNTER — Ambulatory Visit: Payer: Medicare Other | Attending: Orthopedic Surgery | Admitting: Physical Therapy

## 2014-07-20 DIAGNOSIS — M25571 Pain in right ankle and joints of right foot: Secondary | ICD-10-CM

## 2014-07-20 DIAGNOSIS — R6889 Other general symptoms and signs: Secondary | ICD-10-CM

## 2014-07-20 DIAGNOSIS — R531 Weakness: Secondary | ICD-10-CM

## 2014-07-20 DIAGNOSIS — M7989 Other specified soft tissue disorders: Secondary | ICD-10-CM

## 2014-07-20 DIAGNOSIS — R269 Unspecified abnormalities of gait and mobility: Secondary | ICD-10-CM | POA: Diagnosis not present

## 2014-07-20 NOTE — Therapy (Signed)
[facility] [address], [place], [place], [address] Phone: [phone]   Fax:  [phone]  Physical Therapy Treatment  Patient Details  Name: Kevin Kim MRN: [id number] Date of Birth: 05/14/1945 Referring Provider:  [place]e [place], MD  Encounter Date: 07/20/2014      PT End of Session - 07/20/14 1204    Visit Number 19   Number of Visits 20   Date for PT Re-Evaluation 07/26/14   PT Start Time 1140   PT Stop Time 1245   PT Time Calculation (min) 65 min   Activity Tolerance Patient tolerated treatment well   Behavior During Therapy [facility] for tasks assessed/performed      Past Medical History  Diagnosis Date  . GERD (gastroesophageal reflux disease)   . Irregular heartbeat     1980's.  . Pneumonia     last time 1993  . Anxiety     while in hospital  . Depression     Past Surgical History  Procedure Laterality Date  . I&d extremity Right 10/24/2013    Procedure: IRRIGATION AND DEBRIDEMENT OPEN RIGHT TIBIA/FIBULA FRACTURE;  Surgeon: Mauri Pole, MD;  Location: [place];  Service: Orthopedics;  Laterality: Right;  . External fixation leg Right 10/24/2013    Procedure: EXTERNAL FIXATION RIGHT LOWER LEG;  Surgeon: Mauri Pole, MD;  Location: [place];  Service: Orthopedics;  Laterality: Right;  . Closed reduction tibia Right 10/24/2013    Procedure: CLOSED REDUCTION TIBIA/FIBULA FRACTURE;  Surgeon: Mauri Pole, MD;  Location: [place];  Service: Orthopedics;  Laterality: Right;  . External fixation leg Right 10/27/2013    Procedure:   REVISION OF  EXTERNAL FIXATOR RIGHT LOWER LEG   ORIF  RIGHT TIBIAL PILON APPLICATION OF WOUND VAC;  Surgeon: Rozanna Box, MD;  Location: [place];  Service: Orthopedics;  Laterality: Right;  . Colonoscopy    . Hernia repair Bilateral [id number]    plus umbilicial-   . Orif tibia fracture Right 01/2014  . Cosmetic surgery Right     at [facility] 11/15/2013    left wrist to right  ankle   . Orif tibia fracture Right 01/19/2014    Procedure: OPEN REDUCTION INTERNAL FIXATION (ORIF) TIBIA/FIBULA FRACTURE REPAIR OF TIBIA/FIBULA NON UNION;  Surgeon: Rozanna Box, MD;  Location: [place];  Service: Orthopedics;  Laterality: Right;    There were no vitals filed for this visit.  Visit Diagnosis:  Weakness generalized  Pain in joint, ankle and foot, right  Abnormality of gait  Activity intolerance  Swelling of limb      Subjective Assessment - 07/20/14 1201    Subjective Pt complains of pain in the morning in Rt ankle rated as 3/10. Pt noticed now other side of big toe nail seems to be ingrown.   Currently in Pain? Yes  in the  morning   Pain Score 3    Pain Location Ankle   Pain Orientation Right   Pain Descriptors / Indicators Nagging;Dull   Pain Type Chronic pain   Pain Onset More than a month ago   Pain Frequency Intermittent   Multiple Pain Sites No            [facility] PT Assessment - 07/20/14 0001    Ambulation/Gait   Ambulation/Gait Yes   Ambulation/Gait Assistance 6: Modified independent (Device/Increase time)   Assistive device None   Gait Pattern Step-through pattern;Decreased step length - right;Decreased step length - left;Decreased dorsiflexion - right  Ambulation Surface Level   Gait Comments slow gait pattern and short stance phase on Rt LE                     [facility] Adult PT Treatment/Exercise - 07/20/14 0001    Lumbar Exercises: Machines for Strengthening   Cybex Knee Extension Rt LE 15# 3 x 10   Cybex Knee Flexion 35# B LE,    Leg Press St# 7 105#  3x10with B LE, 55# with Rt LE   Lumbar Exercises: Standing   Other Standing Lumbar Exercises Ambulation all needed distance in gym without cane today   Knee/Hip Exercises: Aerobic   Stationary Bike L3 x 16 min 78mles, 523mes   Elliptical incline2/resistance 1 x 78m71m   Knee/Hip Exercises: Standing   SLS SLSx3 on trampoline 20sec with 1 UE support, each leg   Modalities    Modalities Vasopneumatic   Vasopneumatic   Number Minutes Vasopneumatic  15 minutes   Vasopnuematic Location  Ankle   Vasopneumatic Pressure Medium   Vasopneumatic Temperature  3 snowflakes                  PT Short Term Goals - 07/20/14 1208    PT SHORT TERM GOAL #1   Title be indepenent with initial HEP   Time 4   Period Weeks   Status Achieved   PT SHORT TERM GOAL #2   Title wean from walker to use of cane for home distances > or = to 50% of the time   Time 4   Period Weeks   Status Achieved   PT SHORT TERM GOAL #3   Title walk for 20-25 minutes without need to rest   Time 4   Period Weeks   Status Achieved   PT SHORT TERM GOAL #4   Title walk with step-through gait pattern on level surface for all distances   Time 4   Period Weeks   Status Achieved           PT Long Term Goals - 07/20/14 1209    PT LONG TERM GOAL #1   Title be independent in advanced HEP   Time 8   Period Weeks   Status On-going   PT LONG TERM GOAL #2   Title reduce FOTO to < or = to 43% limitation   Time 8   Period Weeks   Status Partially Met   PT LONG TERM GOAL #3   Title wean from walker to use of cane > or = to 50-75% of the time in the community   Time 8   Period Weeks   Status Achieved   PT LONG TERM GOAL #4   Title walk for exercise > or= to 30 minutes with use of cane   Time 8   Period Weeks   Status Achieved   PT LONG TERM GOAL #5   Title report < or = to 3/10 Rt foot pain with standing and walking long periods   PT LONG TERM GOAL #6   Title wean from cane to no device > or = to 50% of the time with safety   Time 8   Period Weeks   Status Achieved               Plan - 07/20/14 1205    Clinical Impression Statement Pt able to perform all activities and continues to bendfit from skilled Pt for further strengthening and endurance    Pt  will benefit from skilled therapeutic intervention in order to improve on the following deficits Abnormal gait;Decreased  range of motion;Difficulty walking;Impaired flexibility;Increased edema;Decreased endurance;Decreased activity tolerance;Pain;Decreased mobility;Decreased strength   Rehab Potential Good   PT Frequency 2x / week   PT Duration 6 weeks   PT Treatment/Interventions ADLs/Self Care Home Management;Scar mobilization;Therapeutic exercise;Passive range of motion;Manual techniques;Gait training;Cryotherapy;Neuromuscular re-education;Electrical Stimulation;Other (comment);Patient/family education   PT Next Visit Plan See what MD decides in regard D/C vs continuing of PT   Consulted and Agree with Plan of Care Patient        Problem List Patient Active Problem List   Diagnosis Date Noted  . 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 Anxiety   . Depression   . Fracture of distal end of right tibia 01/19/2014  . GERD (gastroesophageal reflux disease) 10/30/2013  . Fall from ladder 10/30/2013  . Acute urinary retention 10/30/2013  . Open fracture of tibial plafond with fibula involvement 10/24/2013    NAUMANN-HOUEGNIFIO,Saumya Hukill PTA 07/20/2014, 12:42 PM  [facility] [address] [place], [place], [address] Phone: [phone]   Fax:  [phone]

## 2014-07-21 DIAGNOSIS — S82391N Other fracture of lower end of right tibia, subsequent encounter for open fracture type IIIA, IIIB, or IIIC with nonunion: Secondary | ICD-10-CM | POA: Diagnosis not present

## 2014-07-22 ENCOUNTER — Ambulatory Visit: Payer: Medicare Other | Admitting: Physical Therapy

## 2014-07-22 ENCOUNTER — Encounter: Payer: Self-pay | Admitting: Physical Therapy

## 2014-07-22 DIAGNOSIS — M7989 Other specified soft tissue disorders: Secondary | ICD-10-CM

## 2014-07-22 DIAGNOSIS — R269 Unspecified abnormalities of gait and mobility: Secondary | ICD-10-CM | POA: Diagnosis not present

## 2014-07-22 DIAGNOSIS — R6889 Other general symptoms and signs: Secondary | ICD-10-CM | POA: Diagnosis not present

## 2014-07-22 DIAGNOSIS — R531 Weakness: Secondary | ICD-10-CM

## 2014-07-22 DIAGNOSIS — M25571 Pain in right ankle and joints of right foot: Secondary | ICD-10-CM | POA: Diagnosis not present

## 2014-07-22 NOTE — Therapy (Addendum)
[facility] [address] [place], [place], [address] Phone: [phone]   Fax:  [phone]  Physical Therapy Treatment  Patient Details  Name: Kevin Kim MRN: [id number] Date of Birth: Aug 18, 1945 Referring Provider:  Altamese Oakmont, MD  Encounter Date: 07/22/2014      PT End of Session - 07/22/14 1207    Visit Number 20   Number of Visits 20   Date for PT Re-Evaluation 07/26/14   PT Start Time 1140   PT Stop Time 1242   PT Time Calculation (min) 62 min   Activity Tolerance Patient tolerated treatment well   Behavior During Therapy [facility] for tasks assessed/performed      Past Medical History  Diagnosis Date  . GERD (gastroesophageal reflux disease)   . Irregular heartbeat     1980's.  . Pneumonia     last time 1993  . Anxiety     while in hospital  . Depression     Past Surgical History  Procedure Laterality Date  . I&d extremity Right 10/24/2013    Procedure: IRRIGATION AND DEBRIDEMENT OPEN RIGHT TIBIA/FIBULA FRACTURE;  Surgeon: Mauri Pole, MD;  Location: [place];  Service: Orthopedics;  Laterality: Right;  . External fixation leg Right 10/24/2013    Procedure: EXTERNAL FIXATION RIGHT LOWER LEG;  Surgeon: Mauri Pole, MD;  Location: [place];  Service: Orthopedics;  Laterality: Right;  . Closed reduction tibia Right 10/24/2013    Procedure: CLOSED REDUCTION TIBIA/FIBULA FRACTURE;  Surgeon: Mauri Pole, MD;  Location: [place];  Service: Orthopedics;  Laterality: Right;  . External fixation leg Right 10/27/2013    Procedure:   REVISION OF  EXTERNAL FIXATOR RIGHT LOWER LEG   ORIF  RIGHT TIBIAL PILON APPLICATION OF WOUND VAC;  Surgeon: Rozanna Box, MD;  Location: [place];  Service: Orthopedics;  Laterality: Right;  . Colonoscopy    . Hernia repair Bilateral [id number]    plus umbilicial-   . Orif tibia fracture Right 01/2014  . Cosmetic surgery Right     at [facility] 11/15/2013    left wrist to right  ankle   . Orif tibia fracture Right 01/19/2014    Procedure: OPEN REDUCTION INTERNAL FIXATION (ORIF) TIBIA/FIBULA FRACTURE REPAIR OF TIBIA/FIBULA NON UNION;  Surgeon: Rozanna Box, MD;  Location: [place];  Service: Orthopedics;  Laterality: Right;    There were no vitals filed for this visit.  Visit Diagnosis:  Weakness generalized  Pain in joint, ankle and foot, right  Abnormality of gait  Activity intolerance  Swelling of limb          [facility] PT Assessment - 07/22/14 0001    Observation/Other Assessments   Focus on Therapeutic Outcomes (FOTO)  36%   Ambulation/Gait   Ambulation/Gait Yes   Ambulation/Gait Assistance 6: Modified independent (Device/Increase time)  using cane   Gait Pattern Step-through pattern;Decreased step length - right;Decreased step length - left;Decreased dorsiflexion - right   Ambulation Surface Level   Gait Comments slow gait pattern and short stance phase on Rt LE                     [facility] Adult PT Treatment/Exercise - 07/22/14 0001    Lumbar Exercises: Machines for Strengthening   Cybex Knee Extension Rt LE 15# 3 x 10   Cybex Knee Flexion 35# B LE,    Leg Press St# 7 110#  3x10with B LE, 55# with Rt LE 3x10  Lumbar Exercises: Standing   Other Standing Lumbar Exercises Ambulation all needed distance in gym without cane today   Knee/Hip Exercises: Aerobic   Stationary Bike L3 x 16 min 19mles, 574mes   Elliptical incline 5 resistance5  x 67m1m   Knee/Hip Exercises: Standing   SLS SLSx3 on trampoline 20sec with 1 UE support, each leg  with Left and Rt LE   Modalities   Modalities Vasopneumatic   Vasopneumatic   Number Minutes Vasopneumatic  15 minutes   Vasopnuematic Location  Ankle   Vasopneumatic Pressure Medium   Vasopneumatic Temperature  3 snowflakes                  PT Short Term Goals - 07/22/14 1216    PT SHORT TERM GOAL #1   Title be indepenent with initial HEP   Time 4   Period Weeks   Status  Achieved   PT SHORT TERM GOAL #2   Title wean from walker to use of cane for home distances > or = to 50% of the time   Time 4   Period Weeks   Status Achieved   PT SHORT TERM GOAL #4   Title walk with step-through gait pattern on level surface for all distances   Time 4   Period Weeks   Status Achieved           PT Long Term Goals - 07/22/14 1218    PT LONG TERM GOAL #1   Title be independent in advanced HEP   Time 8   Period Weeks   Status Achieved   PT LONG TERM GOAL #2   Title reduce FOTO to < or = to 43% limitation  as of 07/22/2014 36% limitations, status CJ   Time 8   Period Weeks   Status Achieved   PT LONG TERM GOAL #3   Title wean from walker to use of cane > or = to 50-75% of the time in the community   Time 8   Period Weeks   Status Achieved   PT LONG TERM GOAL #4   Title walk for exercise > or= to 30 minutes with use of cane   Time 8   Period Weeks   Status Achieved   PT LONG TERM GOAL #5   Title report < or = to 3/10 Rt foot pain with standing and walking long periods   Time 8   Period Weeks   Status Achieved   PT LONG TERM GOAL #6   Title wean from cane to no device > or = to 50% of the time with safety   Time 8   Period Weeks   Status Achieved               Plan - 07/22/14 1210    Clinical Impression Statement Pt able to increase weight on leg press and resistance on bike. Pt saw MD yesterday x-ray shows well healed bone on ankle. MD is pleased and recommends D/C from PT per pt report   Pt will benefit from skilled therapeutic intervention in order to improve on the following deficits Abnormal gait;Decreased range of motion;Difficulty walking;Impaired flexibility;Increased edema;Decreased endurance;Decreased activity tolerance;Pain;Decreased mobility;Decreased strength   Rehab Potential Good   PT Frequency 2x / week   PT Duration 6 weeks   PT Treatment/Interventions ADLs/Self Care Home Management;Scar mobilization;Therapeutic  exercise;Passive range of motion;Manual techniques;Gait training;Cryotherapy;Neuromuscular re-education;Electrical Stimulation;Other (comment);Patient/family education   PT Next Visit Plan D/C to HEP   Consulted  and Agree with Plan of Care Patient          G-Codes - 20-Aug-2014 1316    Functional Assessment Tool Used FOTO: 36% limitation   Functional Limitation Mobility: Walking and moving around   Mobility: Walking and Moving Around Goal Status [phone]) At least 40 percent but less than 60 percent impaired, limited or restricted   Mobility: Walking and Moving Around Discharge Status [id number]) At least 20 percent but less than 40 percent impaired, limited or restricted      Problem List Patient Active Problem List   Diagnosis Date Noted  . 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 Anxiety   . Depression   . Fracture of distal end of right tibia 01/19/2014  . GERD (gastroesophageal reflux disease) 10/30/2013  . Fall from ladder 10/30/2013  . Acute urinary retention 10/30/2013  . Open fracture of tibial plafond with fibula involvement 10/24/2013    TAKACS,KELLY PTA 2014-08-20, 1:26 PM PHYSICAL THERAPY DISCHARGE SUMMARY  Visits from Start of Care: 20  Current functional level related to goals / functional outcomes: See above for goal status.   Remaining deficits: Pt with gait abnormality due to reduced ankle ORIF.  Pt has HEP in place to address strength and endurance progression.     Education / Equipment: HEP Plan: Patient agrees to discharge.  Patient goals were met. Patient is being discharged due to meeting the stated rehab goals.  ?????   Sigurd Sos, PT 08-20-14 1:26 PM  [facility] [address], [place], [place], [address] Phone: [phone]   Fax:  [phone]

## 2014-07-27 ENCOUNTER — Ambulatory Visit: Payer: Medicare Other

## 2014-07-27 DIAGNOSIS — Z1389 Encounter for screening for other disorder: Secondary | ICD-10-CM | POA: Diagnosis not present

## 2014-07-27 DIAGNOSIS — F900 Attention-deficit hyperactivity disorder, predominantly inattentive type: Secondary | ICD-10-CM | POA: Diagnosis not present

## 2014-07-27 DIAGNOSIS — E780 Pure hypercholesterolemia: Secondary | ICD-10-CM | POA: Diagnosis not present

## 2014-07-27 DIAGNOSIS — F325 Major depressive disorder, single episode, in full remission: Secondary | ICD-10-CM | POA: Diagnosis not present

## 2014-07-27 DIAGNOSIS — Z0000 Encounter for general adult medical examination without abnormal findings: Secondary | ICD-10-CM | POA: Diagnosis not present

## 2014-07-27 DIAGNOSIS — Z79899 Other long term (current) drug therapy: Secondary | ICD-10-CM | POA: Diagnosis not present

## 2014-07-28 DIAGNOSIS — E780 Pure hypercholesterolemia: Secondary | ICD-10-CM | POA: Diagnosis not present

## 2014-07-28 DIAGNOSIS — Z0000 Encounter for general adult medical examination without abnormal findings: Secondary | ICD-10-CM | POA: Diagnosis not present

## 2014-07-28 DIAGNOSIS — Z79899 Other long term (current) drug therapy: Secondary | ICD-10-CM | POA: Diagnosis not present

## 2014-07-28 DIAGNOSIS — Z125 Encounter for screening for malignant neoplasm of prostate: Secondary | ICD-10-CM | POA: Diagnosis not present

## 2014-07-29 ENCOUNTER — Encounter: Payer: Federal, State, Local not specified - PPO | Admitting: Physical Therapy

## 2014-08-03 ENCOUNTER — Encounter: Payer: Federal, State, Local not specified - PPO | Admitting: Physical Therapy

## 2014-08-24 DIAGNOSIS — L600 Ingrowing nail: Secondary | ICD-10-CM | POA: Diagnosis not present

## 2014-09-08 DIAGNOSIS — L600 Ingrowing nail: Secondary | ICD-10-CM | POA: Diagnosis not present

## 2014-09-22 DIAGNOSIS — S82391N Other fracture of lower end of right tibia, subsequent encounter for open fracture type IIIA, IIIB, or IIIC with nonunion: Secondary | ICD-10-CM | POA: Diagnosis not present

## 2014-09-22 DIAGNOSIS — M722 Plantar fascial fibromatosis: Secondary | ICD-10-CM | POA: Diagnosis not present

## 2014-10-01 DIAGNOSIS — Z1211 Encounter for screening for malignant neoplasm of colon: Secondary | ICD-10-CM | POA: Diagnosis not present

## 2014-10-01 DIAGNOSIS — K5730 Diverticulosis of large intestine without perforation or abscess without bleeding: Secondary | ICD-10-CM | POA: Diagnosis not present

## 2014-10-11 DIAGNOSIS — G5632 Lesion of radial nerve, left upper limb: Secondary | ICD-10-CM | POA: Diagnosis not present

## 2014-10-11 DIAGNOSIS — G5602 Carpal tunnel syndrome, left upper limb: Secondary | ICD-10-CM | POA: Diagnosis not present

## 2014-10-19 DIAGNOSIS — G5601 Carpal tunnel syndrome, right upper limb: Secondary | ICD-10-CM | POA: Diagnosis not present

## 2014-10-19 DIAGNOSIS — G5602 Carpal tunnel syndrome, left upper limb: Secondary | ICD-10-CM | POA: Diagnosis not present

## 2014-10-27 DIAGNOSIS — G5601 Carpal tunnel syndrome, right upper limb: Secondary | ICD-10-CM | POA: Diagnosis not present

## 2014-10-27 DIAGNOSIS — G5602 Carpal tunnel syndrome, left upper limb: Secondary | ICD-10-CM | POA: Diagnosis not present

## 2014-10-29 DIAGNOSIS — G5601 Carpal tunnel syndrome, right upper limb: Secondary | ICD-10-CM | POA: Diagnosis not present

## 2014-10-29 DIAGNOSIS — G5602 Carpal tunnel syndrome, left upper limb: Secondary | ICD-10-CM | POA: Diagnosis not present

## 2014-11-01 DIAGNOSIS — G5791 Unspecified mononeuropathy of right lower limb: Secondary | ICD-10-CM | POA: Diagnosis not present

## 2014-11-02 DIAGNOSIS — G5601 Carpal tunnel syndrome, right upper limb: Secondary | ICD-10-CM | POA: Diagnosis not present

## 2014-11-02 DIAGNOSIS — G5602 Carpal tunnel syndrome, left upper limb: Secondary | ICD-10-CM | POA: Diagnosis not present

## 2014-11-19 DIAGNOSIS — J0140 Acute pansinusitis, unspecified: Secondary | ICD-10-CM | POA: Diagnosis not present

## 2014-11-19 DIAGNOSIS — R6889 Other general symptoms and signs: Secondary | ICD-10-CM | POA: Diagnosis not present

## 2014-11-19 DIAGNOSIS — R69 Illness, unspecified: Secondary | ICD-10-CM | POA: Diagnosis not present

## 2014-11-19 DIAGNOSIS — R509 Fever, unspecified: Secondary | ICD-10-CM | POA: Diagnosis not present

## 2014-11-23 DIAGNOSIS — E7800 Pure hypercholesterolemia, unspecified: Secondary | ICD-10-CM | POA: Diagnosis not present

## 2014-12-09 DIAGNOSIS — M5417 Radiculopathy, lumbosacral region: Secondary | ICD-10-CM | POA: Diagnosis not present

## 2014-12-09 DIAGNOSIS — R201 Hypoesthesia of skin: Secondary | ICD-10-CM | POA: Diagnosis not present

## 2014-12-09 DIAGNOSIS — E559 Vitamin D deficiency, unspecified: Secondary | ICD-10-CM | POA: Diagnosis not present

## 2014-12-09 DIAGNOSIS — R252 Cramp and spasm: Secondary | ICD-10-CM | POA: Diagnosis not present

## 2014-12-09 DIAGNOSIS — R202 Paresthesia of skin: Secondary | ICD-10-CM | POA: Diagnosis not present

## 2014-12-09 DIAGNOSIS — M2041 Other hammer toe(s) (acquired), right foot: Secondary | ICD-10-CM | POA: Diagnosis not present

## 2014-12-09 DIAGNOSIS — R634 Abnormal weight loss: Secondary | ICD-10-CM | POA: Diagnosis not present

## 2014-12-09 DIAGNOSIS — G609 Hereditary and idiopathic neuropathy, unspecified: Secondary | ICD-10-CM | POA: Diagnosis not present

## 2014-12-09 DIAGNOSIS — G603 Idiopathic progressive neuropathy: Secondary | ICD-10-CM | POA: Diagnosis not present

## 2015-01-18 DIAGNOSIS — Z23 Encounter for immunization: Secondary | ICD-10-CM | POA: Diagnosis not present

## 2015-01-20 DIAGNOSIS — R202 Paresthesia of skin: Secondary | ICD-10-CM | POA: Diagnosis not present

## 2015-01-20 DIAGNOSIS — M5442 Lumbago with sciatica, left side: Secondary | ICD-10-CM | POA: Diagnosis not present

## 2015-01-20 DIAGNOSIS — R201 Hypoesthesia of skin: Secondary | ICD-10-CM | POA: Diagnosis not present

## 2015-01-20 DIAGNOSIS — R252 Cramp and spasm: Secondary | ICD-10-CM | POA: Diagnosis not present

## 2015-01-20 DIAGNOSIS — M5441 Lumbago with sciatica, right side: Secondary | ICD-10-CM | POA: Diagnosis not present

## 2015-01-20 DIAGNOSIS — M2041 Other hammer toe(s) (acquired), right foot: Secondary | ICD-10-CM | POA: Diagnosis not present

## 2015-01-31 DIAGNOSIS — F325 Major depressive disorder, single episode, in full remission: Secondary | ICD-10-CM | POA: Diagnosis not present

## 2015-01-31 DIAGNOSIS — E7800 Pure hypercholesterolemia, unspecified: Secondary | ICD-10-CM | POA: Diagnosis not present

## 2015-01-31 DIAGNOSIS — E669 Obesity, unspecified: Secondary | ICD-10-CM | POA: Diagnosis not present

## 2015-01-31 DIAGNOSIS — Z6830 Body mass index (BMI) 30.0-30.9, adult: Secondary | ICD-10-CM | POA: Diagnosis not present

## 2015-01-31 DIAGNOSIS — H8110 Benign paroxysmal vertigo, unspecified ear: Secondary | ICD-10-CM | POA: Diagnosis not present

## 2015-02-17 DIAGNOSIS — M5441 Lumbago with sciatica, right side: Secondary | ICD-10-CM | POA: Diagnosis not present

## 2015-02-17 DIAGNOSIS — G603 Idiopathic progressive neuropathy: Secondary | ICD-10-CM | POA: Diagnosis not present

## 2015-02-17 DIAGNOSIS — M5442 Lumbago with sciatica, left side: Secondary | ICD-10-CM | POA: Diagnosis not present

## 2015-04-04 DIAGNOSIS — J988 Other specified respiratory disorders: Secondary | ICD-10-CM | POA: Diagnosis not present

## 2015-04-04 DIAGNOSIS — R05 Cough: Secondary | ICD-10-CM | POA: Diagnosis not present

## 2015-04-27 DIAGNOSIS — J3081 Allergic rhinitis due to animal (cat) (dog) hair and dander: Secondary | ICD-10-CM | POA: Diagnosis not present

## 2015-04-27 DIAGNOSIS — J3089 Other allergic rhinitis: Secondary | ICD-10-CM | POA: Diagnosis not present

## 2015-04-27 DIAGNOSIS — Z91010 Allergy to peanuts: Secondary | ICD-10-CM | POA: Diagnosis not present

## 2015-04-27 DIAGNOSIS — J301 Allergic rhinitis due to pollen: Secondary | ICD-10-CM | POA: Diagnosis not present

## 2015-05-12 DIAGNOSIS — M5442 Lumbago with sciatica, left side: Secondary | ICD-10-CM | POA: Diagnosis not present

## 2015-05-12 DIAGNOSIS — R201 Hypoesthesia of skin: Secondary | ICD-10-CM | POA: Diagnosis not present

## 2015-05-12 DIAGNOSIS — M2041 Other hammer toe(s) (acquired), right foot: Secondary | ICD-10-CM | POA: Diagnosis not present

## 2015-05-12 DIAGNOSIS — G603 Idiopathic progressive neuropathy: Secondary | ICD-10-CM | POA: Diagnosis not present

## 2015-05-12 DIAGNOSIS — M5441 Lumbago with sciatica, right side: Secondary | ICD-10-CM | POA: Diagnosis not present

## 2015-05-18 DIAGNOSIS — H04123 Dry eye syndrome of bilateral lacrimal glands: Secondary | ICD-10-CM | POA: Diagnosis not present

## 2015-05-25 DIAGNOSIS — J3081 Allergic rhinitis due to animal (cat) (dog) hair and dander: Secondary | ICD-10-CM | POA: Diagnosis not present

## 2015-05-25 DIAGNOSIS — Z91010 Allergy to peanuts: Secondary | ICD-10-CM | POA: Diagnosis not present

## 2015-05-25 DIAGNOSIS — J3089 Other allergic rhinitis: Secondary | ICD-10-CM | POA: Diagnosis not present

## 2015-05-25 DIAGNOSIS — J301 Allergic rhinitis due to pollen: Secondary | ICD-10-CM | POA: Diagnosis not present

## 2015-06-23 DIAGNOSIS — R252 Cramp and spasm: Secondary | ICD-10-CM | POA: Diagnosis not present

## 2015-06-23 DIAGNOSIS — R202 Paresthesia of skin: Secondary | ICD-10-CM | POA: Diagnosis not present

## 2015-06-23 DIAGNOSIS — R260 Ataxic gait: Secondary | ICD-10-CM | POA: Diagnosis not present

## 2015-06-23 DIAGNOSIS — M5442 Lumbago with sciatica, left side: Secondary | ICD-10-CM | POA: Diagnosis not present

## 2015-06-23 DIAGNOSIS — R201 Hypoesthesia of skin: Secondary | ICD-10-CM | POA: Diagnosis not present

## 2015-06-23 DIAGNOSIS — M5441 Lumbago with sciatica, right side: Secondary | ICD-10-CM | POA: Diagnosis not present

## 2015-07-01 DIAGNOSIS — L600 Ingrowing nail: Secondary | ICD-10-CM | POA: Diagnosis not present

## 2015-07-07 IMAGING — CR DG CHEST 1V PORT
1 series · 1 of 1 positions shown · non-contrast
Comparison: None.

CLINICAL DATA: Respiratory distress

EXAM:
PORTABLE CHEST - 1 VIEW

[AP]
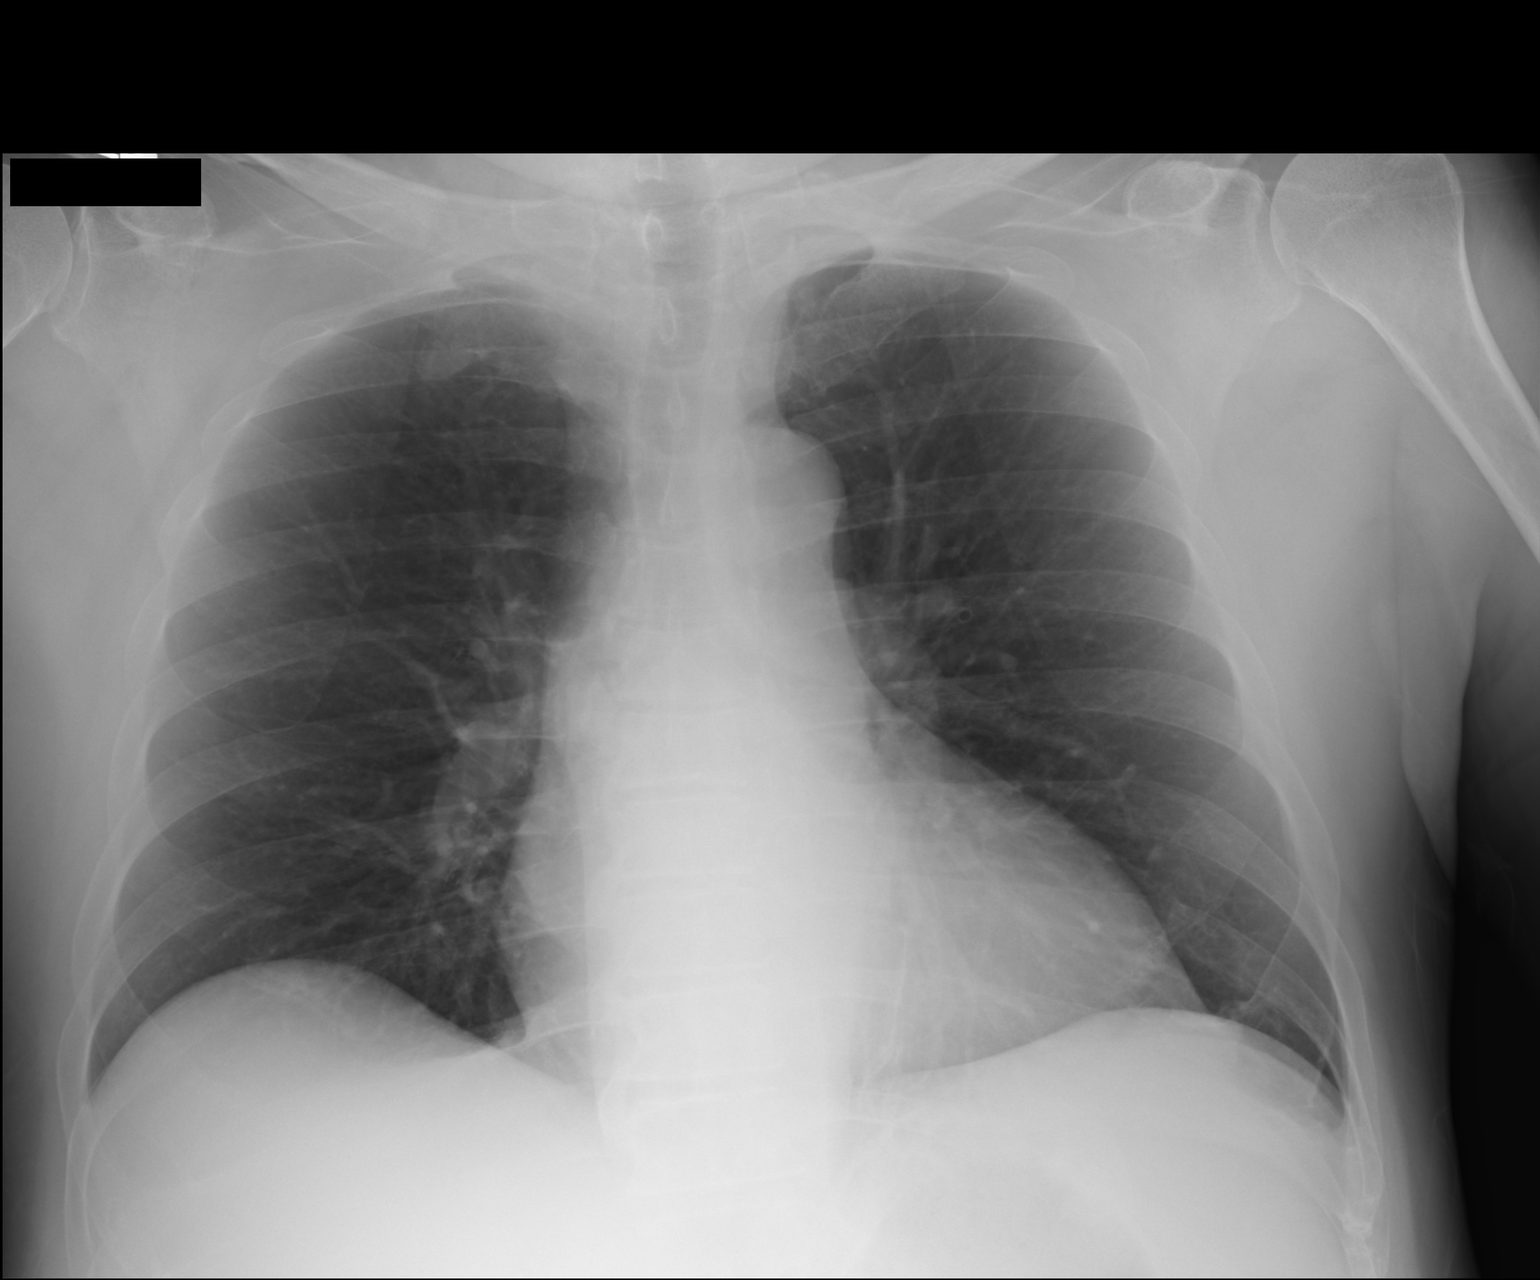

[1 of 1 positions shown; findings below may reference images not displayed]

FINDINGS: The heart size and mediastinal contours are within normal limits.
Both lungs are clear. The visualized skeletal structures are
unremarkable.
IMPRESSION: No active disease.

## 2015-07-29 DIAGNOSIS — F325 Major depressive disorder, single episode, in full remission: Secondary | ICD-10-CM | POA: Diagnosis not present

## 2015-07-29 DIAGNOSIS — R252 Cramp and spasm: Secondary | ICD-10-CM | POA: Diagnosis not present

## 2015-07-29 DIAGNOSIS — Z0000 Encounter for general adult medical examination without abnormal findings: Secondary | ICD-10-CM | POA: Diagnosis not present

## 2015-07-29 DIAGNOSIS — E663 Overweight: Secondary | ICD-10-CM | POA: Diagnosis not present

## 2015-07-29 DIAGNOSIS — G6289 Other specified polyneuropathies: Secondary | ICD-10-CM | POA: Diagnosis not present

## 2015-07-29 DIAGNOSIS — E7800 Pure hypercholesterolemia, unspecified: Secondary | ICD-10-CM | POA: Diagnosis not present

## 2015-07-29 DIAGNOSIS — Z79899 Other long term (current) drug therapy: Secondary | ICD-10-CM | POA: Diagnosis not present

## 2015-07-29 DIAGNOSIS — Z125 Encounter for screening for malignant neoplasm of prostate: Secondary | ICD-10-CM | POA: Diagnosis not present

## 2015-07-29 DIAGNOSIS — R350 Frequency of micturition: Secondary | ICD-10-CM | POA: Diagnosis not present

## 2015-07-29 DIAGNOSIS — Z6829 Body mass index (BMI) 29.0-29.9, adult: Secondary | ICD-10-CM | POA: Diagnosis not present

## 2015-08-27 IMAGING — CT CT ANKLE*R* W/O CM
4 series · 16 of 33 positions shown, 19 images · non-contrast
Comparison: None.

CLINICAL DATA: RIGHT ankle fracture. Nonunion. Diffuse ankle and
foot pain. Swelling to the mid calf. ORIF 10/24/2013.

EXAM:
CT OF THE RIGHT ANKLE WITHOUT CONTRAST
TECHNIQUE: Multidetector CT imaging of the right ankle was performed according
to the standard protocol. Multiplanar CT image reconstructions were
also generated.

[Series 2: ankle/foot bone · axial · 0.39mm/px · z∈[-313,-176]mm · 5 of 83 slices shown, 7 images]
[im 14/83  soft-tissue]
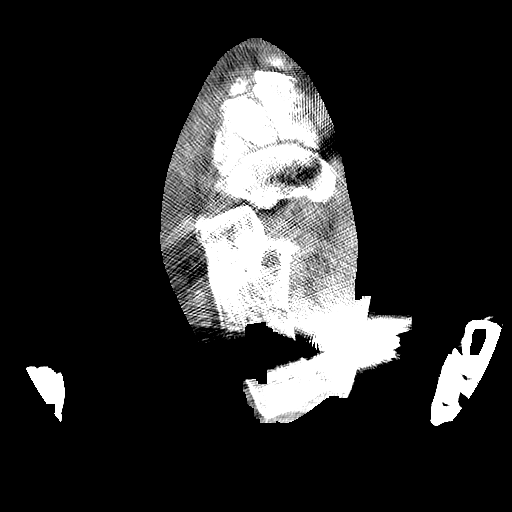
[im 14/83  bone]
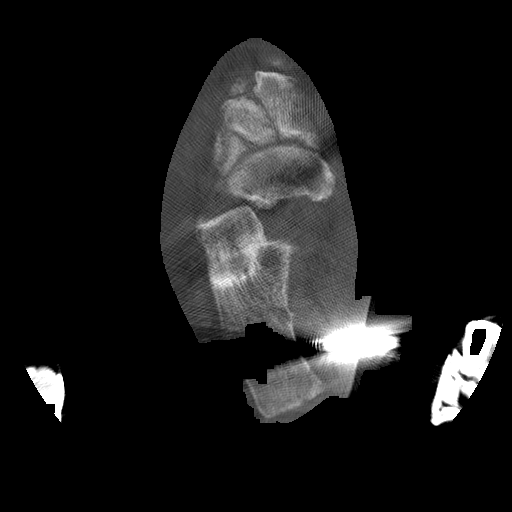
[im 28/83  bone]
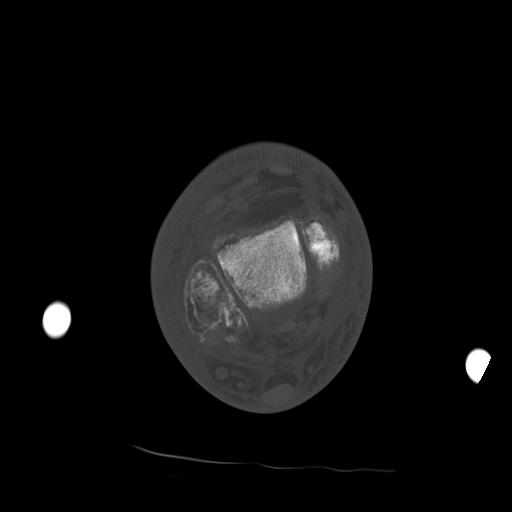
[im 42/83  bone]
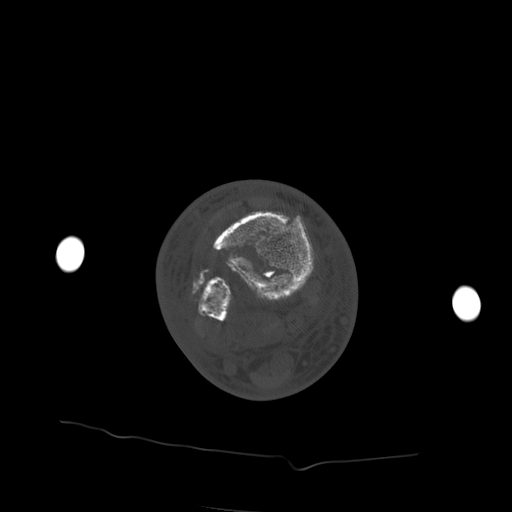
[im 55/83  bone]
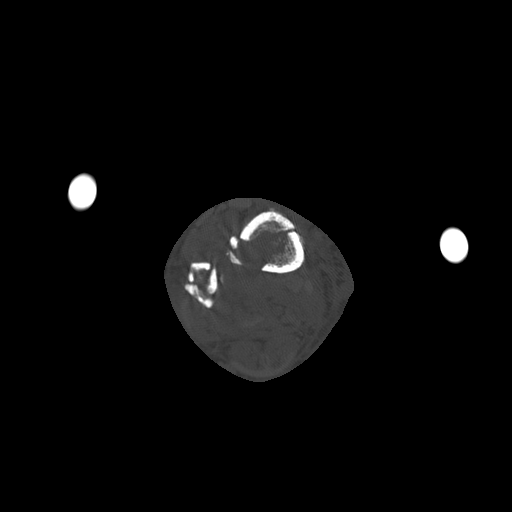
[im 69/83  soft-tissue]
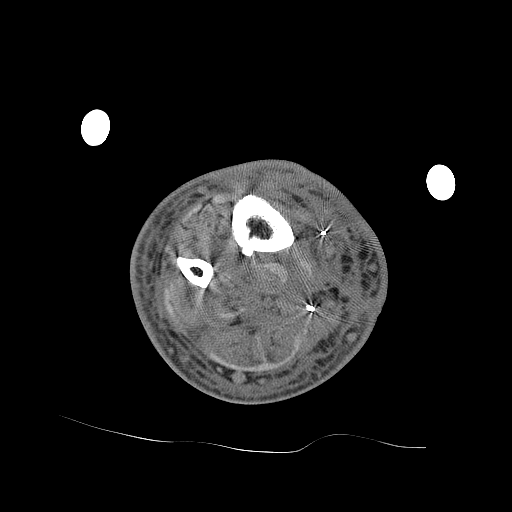
[im 69/83  bone]
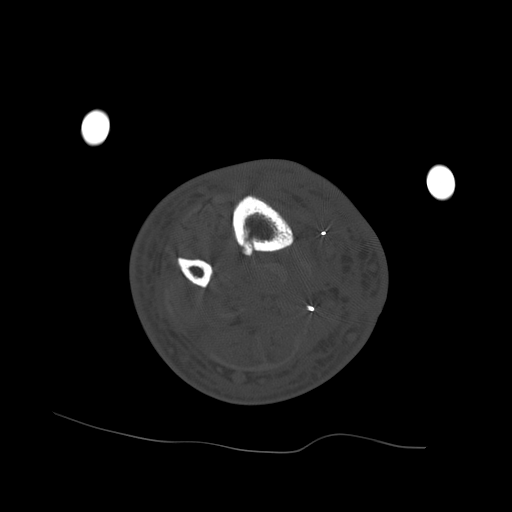

[Series 3: ankle /foot detail · axial · 0.39mm/px · z∈[-313,-243]mm · 3 of 83 slices shown]
[im 14/83  bone]
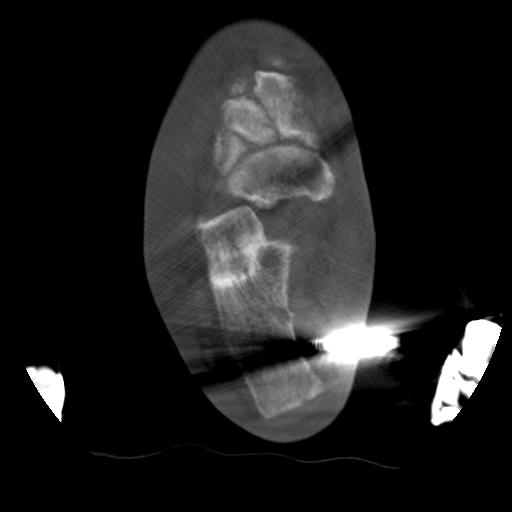
[im 28/83  bone]
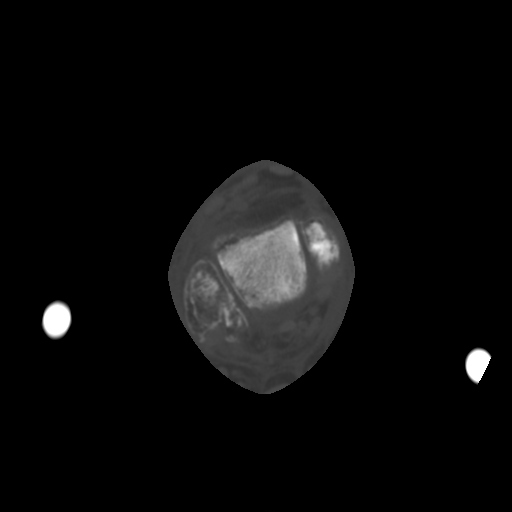
[im 42/83  bone]
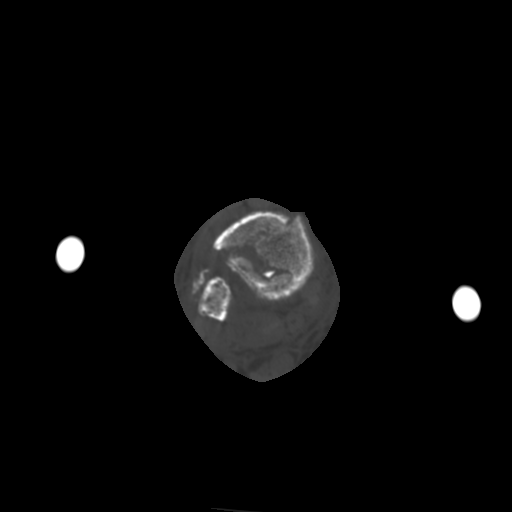

[Series 103: cor soft · coronal · 0.41mm/px · 3 of 71 slices shown]
[im 15/71  bone]
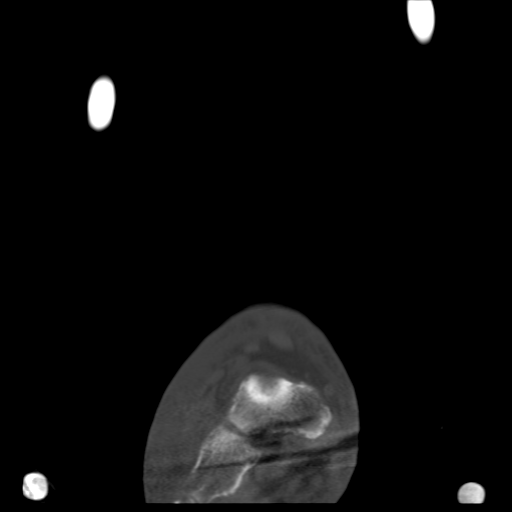
[im 29/71  bone]
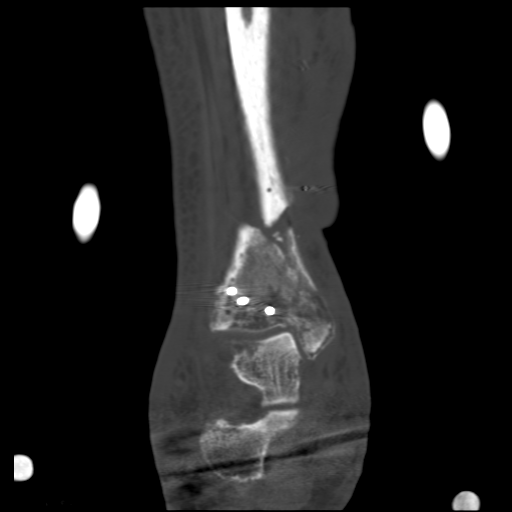
[im 43/71  bone]
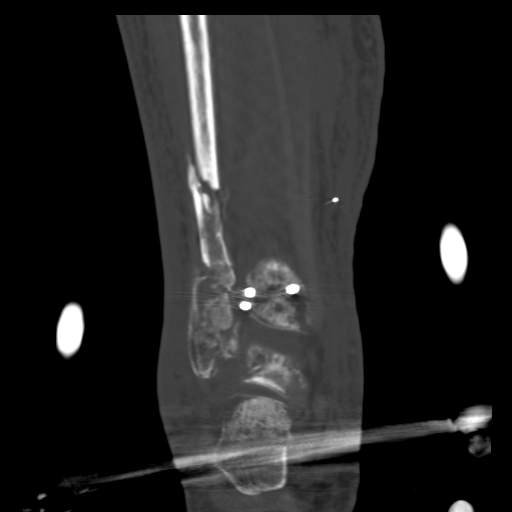

[Series 104: sag soft · sagittal · 0.41mm/px · 5 of 58 slices shown, 6 images]
[im 20/58  bone]
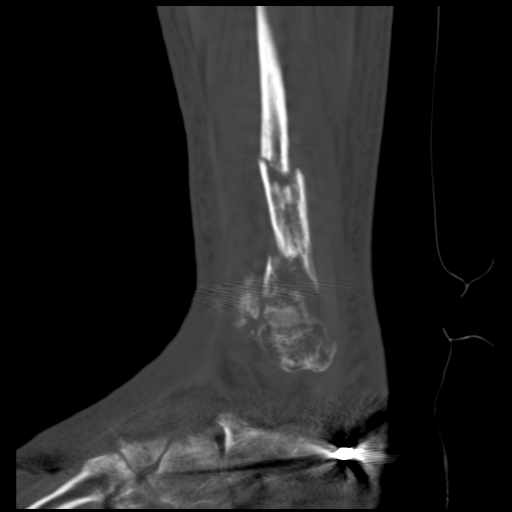
[im 24/58  bone]
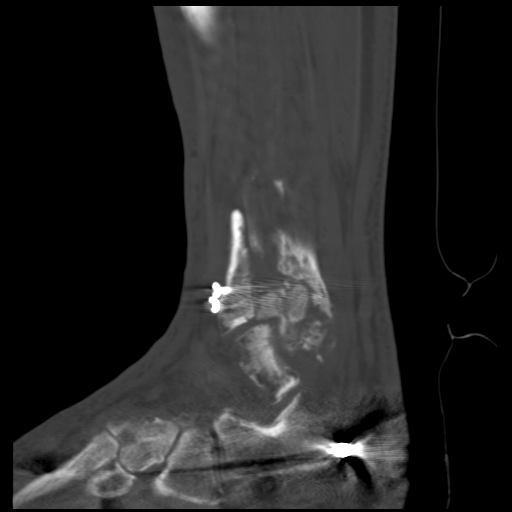
[im 29/58  soft-tissue]
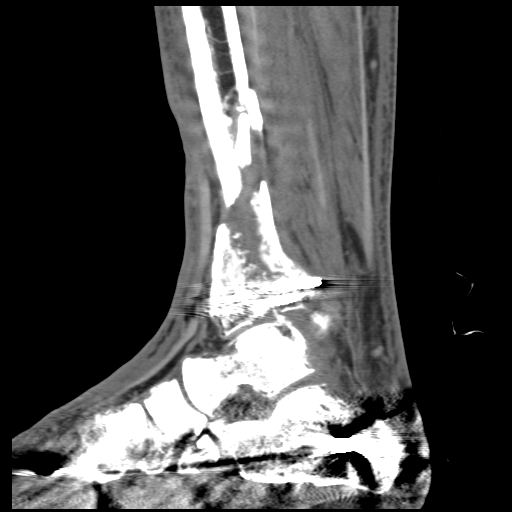
[im 29/58  bone]
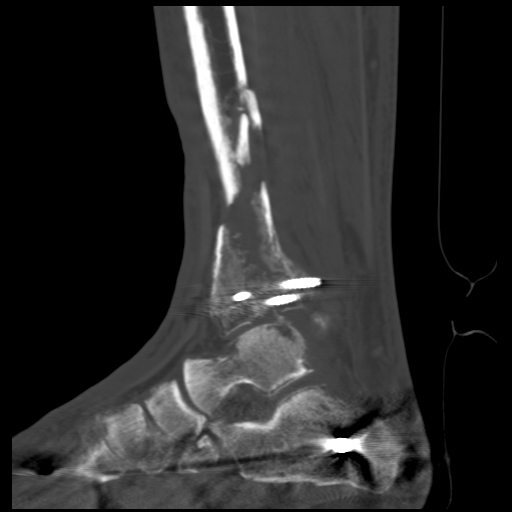
[im 34/58  bone]
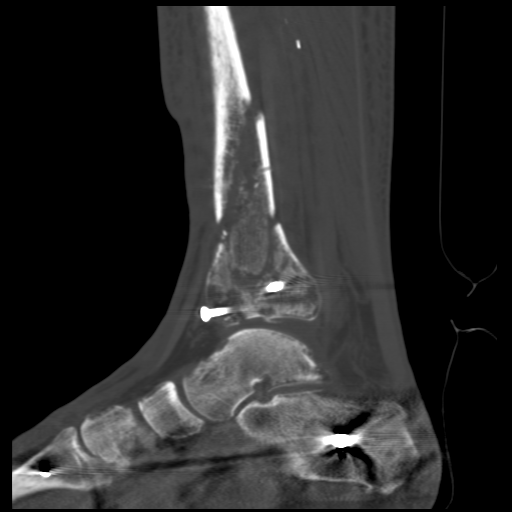
[im 39/58  bone]
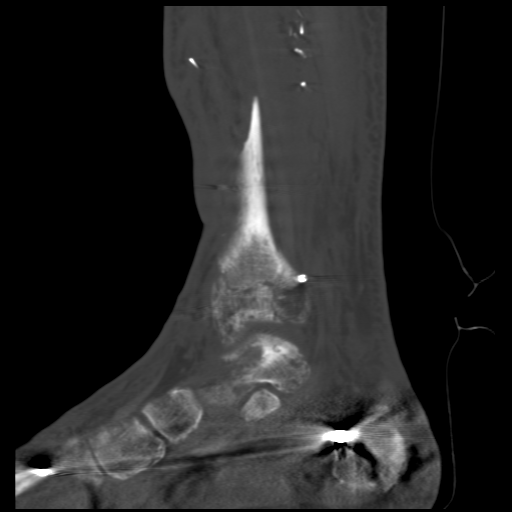

[16 of 33 positions shown; findings below may reference images not displayed]

FINDINGS: External fixator remains present. Disuse osteopenia is present.
There is no bridging bone in the Jumper fracture. No ossified callus
has developed around the comminuted distal fibula fractures. There
are 3 lag screws through the distal tibial plafond. These show no
evidence of loosening or fracture of the hardware. Posterior
malleolus remains approximated.

The segmental distal fibula fracture also shows no evidence of
bridging bone or ossifying callus. Distal fibular metaphysis
fracture remains distracted about 7 mm. The ankle mortise is
congruent. Bone fragments are present dorsal to the lateral
malleolus, probably from the distal fibula fracture. Calcaneus
appears within normal limits. Midfoot bones normal.

Vascular clips are present in the soft tissues of the leg.
IMPRESSION: 1. Delayed union of distal tibia and fibula fractures. There is no
ossified callus or bridging bone.
2. Severe disuse osteopenia.

## 2015-09-01 DIAGNOSIS — M5442 Lumbago with sciatica, left side: Secondary | ICD-10-CM | POA: Diagnosis not present

## 2015-09-01 DIAGNOSIS — R202 Paresthesia of skin: Secondary | ICD-10-CM | POA: Diagnosis not present

## 2015-09-01 DIAGNOSIS — M5441 Lumbago with sciatica, right side: Secondary | ICD-10-CM | POA: Diagnosis not present

## 2015-09-01 DIAGNOSIS — R201 Hypoesthesia of skin: Secondary | ICD-10-CM | POA: Diagnosis not present

## 2015-09-01 DIAGNOSIS — G603 Idiopathic progressive neuropathy: Secondary | ICD-10-CM | POA: Diagnosis not present

## 2015-09-08 ENCOUNTER — Other Ambulatory Visit: Payer: Self-pay

## 2015-10-11 DIAGNOSIS — L259 Unspecified contact dermatitis, unspecified cause: Secondary | ICD-10-CM | POA: Diagnosis not present

## 2015-10-11 DIAGNOSIS — L03115 Cellulitis of right lower limb: Secondary | ICD-10-CM | POA: Diagnosis not present

## 2015-10-20 DIAGNOSIS — Z23 Encounter for immunization: Secondary | ICD-10-CM | POA: Diagnosis not present

## 2015-10-26 DIAGNOSIS — S82391N Other fracture of lower end of right tibia, subsequent encounter for open fracture type IIIA, IIIB, or IIIC with nonunion: Secondary | ICD-10-CM | POA: Diagnosis not present

## 2015-11-10 DIAGNOSIS — M2041 Other hammer toe(s) (acquired), right foot: Secondary | ICD-10-CM | POA: Diagnosis not present

## 2015-11-10 DIAGNOSIS — R201 Hypoesthesia of skin: Secondary | ICD-10-CM | POA: Diagnosis not present

## 2015-11-10 DIAGNOSIS — R202 Paresthesia of skin: Secondary | ICD-10-CM | POA: Diagnosis not present

## 2015-11-10 DIAGNOSIS — M5441 Lumbago with sciatica, right side: Secondary | ICD-10-CM | POA: Diagnosis not present

## 2015-11-10 DIAGNOSIS — M5442 Lumbago with sciatica, left side: Secondary | ICD-10-CM | POA: Diagnosis not present

## 2016-02-15 DIAGNOSIS — L600 Ingrowing nail: Secondary | ICD-10-CM | POA: Diagnosis not present

## 2016-02-15 DIAGNOSIS — M19071 Primary osteoarthritis, right ankle and foot: Secondary | ICD-10-CM | POA: Diagnosis not present

## 2016-02-16 DIAGNOSIS — G4700 Insomnia, unspecified: Secondary | ICD-10-CM | POA: Diagnosis not present

## 2016-02-16 DIAGNOSIS — R5382 Chronic fatigue, unspecified: Secondary | ICD-10-CM | POA: Diagnosis not present

## 2016-02-16 DIAGNOSIS — M5442 Lumbago with sciatica, left side: Secondary | ICD-10-CM | POA: Diagnosis not present

## 2016-02-16 DIAGNOSIS — G603 Idiopathic progressive neuropathy: Secondary | ICD-10-CM | POA: Diagnosis not present

## 2016-02-16 DIAGNOSIS — M5441 Lumbago with sciatica, right side: Secondary | ICD-10-CM | POA: Diagnosis not present

## 2016-03-15 DIAGNOSIS — R202 Paresthesia of skin: Secondary | ICD-10-CM | POA: Diagnosis not present

## 2016-03-15 DIAGNOSIS — G5601 Carpal tunnel syndrome, right upper limb: Secondary | ICD-10-CM | POA: Diagnosis not present

## 2016-03-15 DIAGNOSIS — G603 Idiopathic progressive neuropathy: Secondary | ICD-10-CM | POA: Diagnosis not present

## 2016-03-15 DIAGNOSIS — M79671 Pain in right foot: Secondary | ICD-10-CM | POA: Diagnosis not present

## 2016-03-15 DIAGNOSIS — R270 Ataxia, unspecified: Secondary | ICD-10-CM | POA: Diagnosis not present

## 2016-03-15 DIAGNOSIS — M5417 Radiculopathy, lumbosacral region: Secondary | ICD-10-CM | POA: Diagnosis not present

## 2016-04-12 DIAGNOSIS — M2041 Other hammer toe(s) (acquired), right foot: Secondary | ICD-10-CM | POA: Diagnosis not present

## 2016-04-12 DIAGNOSIS — R252 Cramp and spasm: Secondary | ICD-10-CM | POA: Diagnosis not present

## 2016-04-12 DIAGNOSIS — M5441 Lumbago with sciatica, right side: Secondary | ICD-10-CM | POA: Diagnosis not present

## 2016-04-12 DIAGNOSIS — G5601 Carpal tunnel syndrome, right upper limb: Secondary | ICD-10-CM | POA: Diagnosis not present

## 2016-04-12 DIAGNOSIS — M5442 Lumbago with sciatica, left side: Secondary | ICD-10-CM | POA: Diagnosis not present

## 2016-04-12 DIAGNOSIS — G603 Idiopathic progressive neuropathy: Secondary | ICD-10-CM | POA: Diagnosis not present

## 2016-06-11 DIAGNOSIS — H2513 Age-related nuclear cataract, bilateral: Secondary | ICD-10-CM | POA: Diagnosis not present

## 2016-06-21 DIAGNOSIS — G603 Idiopathic progressive neuropathy: Secondary | ICD-10-CM | POA: Diagnosis not present

## 2016-06-21 DIAGNOSIS — R202 Paresthesia of skin: Secondary | ICD-10-CM | POA: Diagnosis not present

## 2016-06-21 DIAGNOSIS — G5601 Carpal tunnel syndrome, right upper limb: Secondary | ICD-10-CM | POA: Diagnosis not present

## 2016-06-21 DIAGNOSIS — M2041 Other hammer toe(s) (acquired), right foot: Secondary | ICD-10-CM | POA: Diagnosis not present

## 2016-06-21 DIAGNOSIS — M5441 Lumbago with sciatica, right side: Secondary | ICD-10-CM | POA: Diagnosis not present

## 2016-06-21 DIAGNOSIS — M5442 Lumbago with sciatica, left side: Secondary | ICD-10-CM | POA: Diagnosis not present

## 2016-07-18 DIAGNOSIS — M84374A Stress fracture, right foot, initial encounter for fracture: Secondary | ICD-10-CM | POA: Diagnosis not present

## 2016-07-25 DIAGNOSIS — M84374D Stress fracture, right foot, subsequent encounter for fracture with routine healing: Secondary | ICD-10-CM | POA: Diagnosis not present

## 2016-08-08 DIAGNOSIS — M84374D Stress fracture, right foot, subsequent encounter for fracture with routine healing: Secondary | ICD-10-CM | POA: Diagnosis not present

## 2016-08-08 DIAGNOSIS — M5442 Lumbago with sciatica, left side: Secondary | ICD-10-CM | POA: Diagnosis not present

## 2016-08-08 DIAGNOSIS — M5441 Lumbago with sciatica, right side: Secondary | ICD-10-CM | POA: Diagnosis not present

## 2016-08-09 DIAGNOSIS — E669 Obesity, unspecified: Secondary | ICD-10-CM | POA: Diagnosis not present

## 2016-08-09 DIAGNOSIS — Z79899 Other long term (current) drug therapy: Secondary | ICD-10-CM | POA: Diagnosis not present

## 2016-08-09 DIAGNOSIS — Z0000 Encounter for general adult medical examination without abnormal findings: Secondary | ICD-10-CM | POA: Diagnosis not present

## 2016-08-09 DIAGNOSIS — E7800 Pure hypercholesterolemia, unspecified: Secondary | ICD-10-CM | POA: Diagnosis not present

## 2016-08-09 DIAGNOSIS — F325 Major depressive disorder, single episode, in full remission: Secondary | ICD-10-CM | POA: Diagnosis not present

## 2016-08-09 DIAGNOSIS — R002 Palpitations: Secondary | ICD-10-CM | POA: Diagnosis not present

## 2016-08-09 DIAGNOSIS — Z6830 Body mass index (BMI) 30.0-30.9, adult: Secondary | ICD-10-CM | POA: Diagnosis not present

## 2016-11-08 DIAGNOSIS — E7800 Pure hypercholesterolemia, unspecified: Secondary | ICD-10-CM | POA: Diagnosis not present

## 2016-11-09 DIAGNOSIS — Z23 Encounter for immunization: Secondary | ICD-10-CM | POA: Diagnosis not present

## 2016-11-14 DIAGNOSIS — G603 Idiopathic progressive neuropathy: Secondary | ICD-10-CM | POA: Diagnosis not present

## 2016-11-14 DIAGNOSIS — R252 Cramp and spasm: Secondary | ICD-10-CM | POA: Diagnosis not present

## 2016-11-14 DIAGNOSIS — M5441 Lumbago with sciatica, right side: Secondary | ICD-10-CM | POA: Diagnosis not present

## 2016-11-14 DIAGNOSIS — R202 Paresthesia of skin: Secondary | ICD-10-CM | POA: Diagnosis not present

## 2016-11-14 DIAGNOSIS — M5442 Lumbago with sciatica, left side: Secondary | ICD-10-CM | POA: Diagnosis not present

## 2017-01-23 DIAGNOSIS — J3081 Allergic rhinitis due to animal (cat) (dog) hair and dander: Secondary | ICD-10-CM | POA: Diagnosis not present

## 2017-01-23 DIAGNOSIS — J301 Allergic rhinitis due to pollen: Secondary | ICD-10-CM | POA: Diagnosis not present

## 2017-01-23 DIAGNOSIS — J3089 Other allergic rhinitis: Secondary | ICD-10-CM | POA: Diagnosis not present

## 2017-02-13 DIAGNOSIS — M5442 Lumbago with sciatica, left side: Secondary | ICD-10-CM | POA: Diagnosis not present

## 2017-02-13 DIAGNOSIS — M5441 Lumbago with sciatica, right side: Secondary | ICD-10-CM | POA: Diagnosis not present

## 2017-02-25 ENCOUNTER — Other Ambulatory Visit: Payer: Self-pay

## 2017-02-26 ENCOUNTER — Other Ambulatory Visit: Payer: Self-pay | Admitting: Specialist

## 2017-02-26 DIAGNOSIS — M5417 Radiculopathy, lumbosacral region: Secondary | ICD-10-CM

## 2017-03-12 ENCOUNTER — Ambulatory Visit
Admission: RE | Admit: 2017-03-12 | Discharge: 2017-03-12 | Disposition: A | Discharge status: Home / Self Care | Payer: Medicare Other | Source: Ambulatory Visit | Attending: Specialist | Admitting: Specialist

## 2017-03-12 DIAGNOSIS — M4696 Unspecified inflammatory spondylopathy, lumbar region: Secondary | ICD-10-CM | POA: Diagnosis not present

## 2017-03-12 DIAGNOSIS — M5417 Radiculopathy, lumbosacral region: Secondary | ICD-10-CM

## 2017-05-08 DIAGNOSIS — M5441 Lumbago with sciatica, right side: Secondary | ICD-10-CM | POA: Diagnosis not present

## 2017-05-08 DIAGNOSIS — R201 Hypoesthesia of skin: Secondary | ICD-10-CM | POA: Diagnosis not present

## 2017-05-08 DIAGNOSIS — M5442 Lumbago with sciatica, left side: Secondary | ICD-10-CM | POA: Diagnosis not present

## 2017-05-08 DIAGNOSIS — R202 Paresthesia of skin: Secondary | ICD-10-CM | POA: Diagnosis not present

## 2017-05-08 DIAGNOSIS — G5601 Carpal tunnel syndrome, right upper limb: Secondary | ICD-10-CM | POA: Diagnosis not present

## 2017-05-08 DIAGNOSIS — G603 Idiopathic progressive neuropathy: Secondary | ICD-10-CM | POA: Diagnosis not present

## 2017-08-15 DIAGNOSIS — G2581 Restless legs syndrome: Secondary | ICD-10-CM | POA: Diagnosis not present

## 2017-08-15 DIAGNOSIS — R202 Paresthesia of skin: Secondary | ICD-10-CM | POA: Diagnosis not present

## 2017-08-15 DIAGNOSIS — G5623 Lesion of ulnar nerve, bilateral upper limbs: Secondary | ICD-10-CM | POA: Diagnosis not present

## 2017-08-15 DIAGNOSIS — M5417 Radiculopathy, lumbosacral region: Secondary | ICD-10-CM | POA: Diagnosis not present

## 2017-08-15 DIAGNOSIS — G5603 Carpal tunnel syndrome, bilateral upper limbs: Secondary | ICD-10-CM | POA: Diagnosis not present

## 2017-08-15 DIAGNOSIS — G603 Idiopathic progressive neuropathy: Secondary | ICD-10-CM | POA: Diagnosis not present

## 2017-08-15 DIAGNOSIS — R51 Headache: Secondary | ICD-10-CM | POA: Diagnosis not present

## 2017-08-20 ENCOUNTER — Ambulatory Visit
Admission: RE | Admit: 2017-08-20 | Discharge: 2017-08-20 | Disposition: A | Discharge status: Home / Self Care | Payer: Medicare Other | Source: Ambulatory Visit | Attending: Geriatric Medicine | Admitting: Geriatric Medicine

## 2017-08-20 ENCOUNTER — Other Ambulatory Visit: Payer: Self-pay | Admitting: Geriatric Medicine

## 2017-08-20 DIAGNOSIS — R05 Cough: Secondary | ICD-10-CM | POA: Diagnosis not present

## 2017-08-20 DIAGNOSIS — Z79899 Other long term (current) drug therapy: Secondary | ICD-10-CM | POA: Diagnosis not present

## 2017-08-20 DIAGNOSIS — M545 Low back pain: Secondary | ICD-10-CM | POA: Diagnosis not present

## 2017-08-20 DIAGNOSIS — G8929 Other chronic pain: Secondary | ICD-10-CM | POA: Diagnosis not present

## 2017-08-20 DIAGNOSIS — E559 Vitamin D deficiency, unspecified: Secondary | ICD-10-CM | POA: Diagnosis not present

## 2017-08-20 DIAGNOSIS — H903 Sensorineural hearing loss, bilateral: Secondary | ICD-10-CM | POA: Diagnosis not present

## 2017-08-20 DIAGNOSIS — K219 Gastro-esophageal reflux disease without esophagitis: Secondary | ICD-10-CM | POA: Diagnosis not present

## 2017-08-20 DIAGNOSIS — R059 Cough, unspecified: Secondary | ICD-10-CM

## 2017-08-20 DIAGNOSIS — Z125 Encounter for screening for malignant neoplasm of prostate: Secondary | ICD-10-CM | POA: Diagnosis not present

## 2017-08-20 DIAGNOSIS — E7800 Pure hypercholesterolemia, unspecified: Secondary | ICD-10-CM | POA: Diagnosis not present

## 2017-08-20 DIAGNOSIS — K9089 Other intestinal malabsorption: Secondary | ICD-10-CM | POA: Diagnosis not present

## 2017-08-20 DIAGNOSIS — Z1389 Encounter for screening for other disorder: Secondary | ICD-10-CM | POA: Diagnosis not present

## 2017-08-20 DIAGNOSIS — F325 Major depressive disorder, single episode, in full remission: Secondary | ICD-10-CM | POA: Diagnosis not present

## 2017-08-20 DIAGNOSIS — Z0000 Encounter for general adult medical examination without abnormal findings: Secondary | ICD-10-CM | POA: Diagnosis not present

## 2017-09-19 DIAGNOSIS — J208 Acute bronchitis due to other specified organisms: Secondary | ICD-10-CM | POA: Diagnosis not present

## 2017-09-19 DIAGNOSIS — B9689 Other specified bacterial agents as the cause of diseases classified elsewhere: Secondary | ICD-10-CM | POA: Diagnosis not present

## 2017-10-22 DIAGNOSIS — H02831 Dermatochalasis of right upper eyelid: Secondary | ICD-10-CM | POA: Diagnosis not present

## 2017-10-22 DIAGNOSIS — H02834 Dermatochalasis of left upper eyelid: Secondary | ICD-10-CM | POA: Diagnosis not present

## 2017-11-07 DIAGNOSIS — M5441 Lumbago with sciatica, right side: Secondary | ICD-10-CM | POA: Diagnosis not present

## 2017-11-07 DIAGNOSIS — G603 Idiopathic progressive neuropathy: Secondary | ICD-10-CM | POA: Diagnosis not present

## 2017-11-07 DIAGNOSIS — M5442 Lumbago with sciatica, left side: Secondary | ICD-10-CM | POA: Diagnosis not present

## 2017-11-07 DIAGNOSIS — G5603 Carpal tunnel syndrome, bilateral upper limbs: Secondary | ICD-10-CM | POA: Diagnosis not present

## 2017-11-07 DIAGNOSIS — R202 Paresthesia of skin: Secondary | ICD-10-CM | POA: Diagnosis not present

## 2017-11-07 DIAGNOSIS — R201 Hypoesthesia of skin: Secondary | ICD-10-CM | POA: Diagnosis not present

## 2017-11-13 DIAGNOSIS — H02423 Myogenic ptosis of bilateral eyelids: Secondary | ICD-10-CM | POA: Diagnosis not present

## 2017-11-13 DIAGNOSIS — H53483 Generalized contraction of visual field, bilateral: Secondary | ICD-10-CM | POA: Diagnosis not present

## 2017-11-13 DIAGNOSIS — H02831 Dermatochalasis of right upper eyelid: Secondary | ICD-10-CM | POA: Diagnosis not present

## 2017-11-13 DIAGNOSIS — H57813 Brow ptosis, bilateral: Secondary | ICD-10-CM | POA: Diagnosis not present

## 2017-11-13 DIAGNOSIS — H02834 Dermatochalasis of left upper eyelid: Secondary | ICD-10-CM | POA: Diagnosis not present

## 2017-11-13 DIAGNOSIS — H0279 Other degenerative disorders of eyelid and periocular area: Secondary | ICD-10-CM | POA: Diagnosis not present

## 2017-11-13 DIAGNOSIS — H02413 Mechanical ptosis of bilateral eyelids: Secondary | ICD-10-CM | POA: Diagnosis not present

## 2017-12-09 DIAGNOSIS — M109 Gout, unspecified: Secondary | ICD-10-CM | POA: Diagnosis not present

## 2017-12-09 DIAGNOSIS — M659 Synovitis and tenosynovitis, unspecified: Secondary | ICD-10-CM | POA: Diagnosis not present

## 2017-12-09 DIAGNOSIS — M67371 Transient synovitis, right ankle and foot: Secondary | ICD-10-CM | POA: Diagnosis not present

## 2017-12-09 DIAGNOSIS — M19071 Primary osteoarthritis, right ankle and foot: Secondary | ICD-10-CM | POA: Diagnosis not present

## 2017-12-11 DIAGNOSIS — M67371 Transient synovitis, right ankle and foot: Secondary | ICD-10-CM | POA: Diagnosis not present

## 2017-12-11 DIAGNOSIS — M659 Synovitis and tenosynovitis, unspecified: Secondary | ICD-10-CM | POA: Diagnosis not present

## 2017-12-18 DIAGNOSIS — M7751 Other enthesopathy of right foot: Secondary | ICD-10-CM | POA: Diagnosis not present

## 2017-12-18 DIAGNOSIS — M109 Gout, unspecified: Secondary | ICD-10-CM | POA: Diagnosis not present

## 2017-12-30 ENCOUNTER — Encounter: Payer: Self-pay | Admitting: Podiatry

## 2017-12-30 ENCOUNTER — Ambulatory Visit (INDEPENDENT_AMBULATORY_CARE_PROVIDER_SITE_OTHER): Payer: Medicare Other

## 2017-12-30 ENCOUNTER — Ambulatory Visit (INDEPENDENT_AMBULATORY_CARE_PROVIDER_SITE_OTHER): Payer: Medicare Other | Admitting: Podiatry

## 2017-12-30 ENCOUNTER — Other Ambulatory Visit: Payer: Self-pay | Admitting: Podiatry

## 2017-12-30 VITALS — BP 173/95 | HR 64

## 2017-12-30 DIAGNOSIS — M2042 Other hammer toe(s) (acquired), left foot: Principal | ICD-10-CM

## 2017-12-30 DIAGNOSIS — M2041 Other hammer toe(s) (acquired), right foot: Secondary | ICD-10-CM

## 2017-12-30 NOTE — Patient Instructions (Signed)
Pre-Operative Instructions  Congratulations, you have decided to take an important step towards improving your quality of life.  You can be assured that the doctors and staff at [facility] will be with you every step of the way.  Here are some important things you should know:  1. Plan to be at the surgery center/hospital at least 1 (one) hour prior to your scheduled time, unless otherwise directed by the surgical center/hospital staff.  You must have a responsible adult accompany you, remain during the surgery and drive you home.  Make sure you have directions to the surgical center/hospital to ensure you arrive on time. 2. If you are having surgery at [facility] or [facility], you will need a copy of your medical history and physical form from your family physician within one month prior to the date of surgery. We will give you a form for your primary physician to complete.  3. We make every effort to accommodate the date you request for surgery.  However, there are times where surgery dates or times have to be moved.  We will contact you as soon as possible if a change in schedule is required.   4. No aspirin/ibuprofen for one week before surgery.  If you are on aspirin, any non-steroidal anti-inflammatory medications (Mobic, Aleve, Ibuprofen) should not be taken seven (7) days prior to your surgery.  You make take Tylenol for pain prior to surgery.  5. Medications - If you are taking daily heart and blood pressure medications, seizure, reflux, allergy, asthma, anxiety, pain or diabetes medications, make sure you notify the surgery center/hospital before the day of surgery so they can tell you which medications you should take or avoid the day of surgery. 6. No food or drink after midnight the night before surgery unless directed otherwise by surgical center/hospital staff. 7. No alcoholic beverages 24-hours prior to surgery.  No smoking 24-hours prior or 24-hours after  surgery. 8. Wear loose pants or shorts. They should be loose enough to fit over bandages, boots, and casts. 9. Don't wear slip-on shoes. Sneakers are preferred. 10. Bring your boot with you to the surgery center/hospital.  Also bring crutches or a walker if your physician has prescribed it for you.  If you do not have this equipment, it will be provided for you after surgery. 11. If you have not been contacted by the surgery center/hospital by the day before your surgery, call to confirm the date and time of your surgery. 12. Leave-time from work may vary depending on the type of surgery you have.  Appropriate arrangements should be made prior to surgery with your employer. 13. Prescriptions will be provided immediately following surgery by your doctor.  Fill these as soon as possible after surgery and take the medication as directed. Pain medications will not be refilled on weekends and must be approved by the doctor. 14. Remove nail polish on the operative foot and avoid getting pedicures prior to surgery. 15. Wash the night before surgery.  The night before surgery wash the foot and leg well with water and the antibacterial soap provided. Be sure to pay special attention to beneath the toenails and in between the toes.  Wash for at least three (3) minutes. Rinse thoroughly with water and dry well with a towel.  Perform this wash unless told not to do so by your physician.  Enclosed: 1 Ice pack (please put in freezer the night before surgery)   1 Hibiclens skin cleaner     Pre-op instructions  If you have any questions regarding the instructions, please do not hesitate to call our office.  [place]: [address], [place], [place] [address] -- [phone]  [place]: [address] , [place] [address] -- [phone]  [place]: [address]  [place], [place] [address] -- [phone]  [place]: [address], [address], [place], [place] [address] -- [phone]  Website:  [email or web address] 

## 2018-01-01 NOTE — Progress Notes (Signed)
   HPI: 73 year old male presenting today, referred by Dr. Gershon Mussel, as a new patient with a chief complaint of painful toes of a painful right second toe that has been present for the past several years. He states the toe curls up and the pain is worsened by wearing shoes. He has not done anything for treatment. Patient is here for further evaluation and treatment.   Past Medical History:  Diagnosis Date  . Anxiety    while in hospital  . Depression   . GERD (gastroesophageal reflux disease)   . Irregular heartbeat    1980's.  . Pneumonia    last time 1993      Objective: Physical Exam General: The patient is alert and oriented x3 in no acute distress.  Dermatology: Skin is cool, dry and supple bilateral lower extremities. Negative for open lesions or macerations.  Vascular: Palpable pedal pulses bilaterally. No edema or erythema noted. Capillary refill within normal limits.  Neurological: Epicritic and protective threshold grossly intact bilaterally.   Musculoskeletal Exam: All pedal and ankle joints range of motion within normal limits bilateral. Muscle strength 5/5 in all groups bilateral. Hammertoe contracture deformity noted to the 2nd digit of the right foot.  Radiographic Exam: Hammertoe contracture deformity noted to the interphalangeal joints and MPJ of the respective hammertoe digits mentioned on clinical musculoskeletal exam.     Assessment: 1. Hammertoe repair 2nd digit right 2. H/o right ankle ORIF 4 years ago   Plan of Care:  1. Patient evaluated. X-Rays reviewed.  2. Today we discussed the conservative versus surgical management of the presenting pathology. The patient opts for surgical management. All possible complications and details of the procedure were explained. All patient questions were answered. No guarantees were expressed or implied. 3. Authorization for surgery was initiated today. Surgery will consist of PIPJ arthroplasty with MPJ capsulotomy 2nd right  (2 separate incisions).  4. Post op shoe dispensed.  5. Return to clinic one week post op.     Edrick Kins, DPM [facility]  Dr. Edrick Kins, DPM    [address], [place] [address]                Office [phone]  Fax [phone]

## 2018-01-14 DIAGNOSIS — H0279 Other degenerative disorders of eyelid and periocular area: Secondary | ICD-10-CM | POA: Diagnosis not present

## 2018-01-14 DIAGNOSIS — H02834 Dermatochalasis of left upper eyelid: Secondary | ICD-10-CM | POA: Diagnosis not present

## 2018-01-14 DIAGNOSIS — H02831 Dermatochalasis of right upper eyelid: Secondary | ICD-10-CM | POA: Diagnosis not present

## 2018-01-14 DIAGNOSIS — H02413 Mechanical ptosis of bilateral eyelids: Secondary | ICD-10-CM | POA: Diagnosis not present

## 2018-01-14 DIAGNOSIS — H53483 Generalized contraction of visual field, bilateral: Secondary | ICD-10-CM | POA: Diagnosis not present

## 2018-01-14 DIAGNOSIS — H02423 Myogenic ptosis of bilateral eyelids: Secondary | ICD-10-CM | POA: Diagnosis not present

## 2018-01-14 DIAGNOSIS — H57813 Brow ptosis, bilateral: Secondary | ICD-10-CM | POA: Diagnosis not present

## 2018-01-22 DIAGNOSIS — J301 Allergic rhinitis due to pollen: Secondary | ICD-10-CM | POA: Diagnosis not present

## 2018-01-22 DIAGNOSIS — J3089 Other allergic rhinitis: Secondary | ICD-10-CM | POA: Diagnosis not present

## 2018-01-22 DIAGNOSIS — J3081 Allergic rhinitis due to animal (cat) (dog) hair and dander: Secondary | ICD-10-CM | POA: Diagnosis not present

## 2018-02-05 ENCOUNTER — Telehealth: Payer: Self-pay | Admitting: *Deleted

## 2018-02-05 NOTE — Telephone Encounter (Signed)
"  I have surgery scheduled at the [facility] on February 20.  I'm calling to inquire what my diet is supposed to be before surgery and what time is my surgery?  Please give me a call back."  I left him a message that his surgery will be done at [facility].  Someone from the surgical center will give you a call a day or two prior to your surgical date and they will give you your arrival time.  You cannot eat or drink anything after midnight the night before your surgery date.  I asked him to call if he has any further questions.

## 2018-02-13 DIAGNOSIS — G603 Idiopathic progressive neuropathy: Secondary | ICD-10-CM | POA: Diagnosis not present

## 2018-02-13 DIAGNOSIS — M545 Low back pain: Secondary | ICD-10-CM | POA: Diagnosis not present

## 2018-02-13 DIAGNOSIS — R201 Hypoesthesia of skin: Secondary | ICD-10-CM | POA: Diagnosis not present

## 2018-02-13 DIAGNOSIS — R202 Paresthesia of skin: Secondary | ICD-10-CM | POA: Diagnosis not present

## 2018-02-18 ENCOUNTER — Telehealth: Payer: Self-pay | Admitting: *Deleted

## 2018-02-18 NOTE — Telephone Encounter (Signed)
I am returning your call.  Someone from the surgical center normally calls a day or two prior to your surgical date.  They will give you your arrival time.  "How am I supposed to make arrangements for transportation if I don't know the time.  My son works nights and sleeps during the day.  He's going to be bringing me."  I apologize, it's their process.  The reasons that the surgical center calls during that time range is because they may have cancellations and have to change times.  They may have patients that are Diabetic that need to go first or they may have children that have to go first.  "Well they need to take me into consideration.  I have had a death in the family.  I have other things going on as well that I have to take care of.  I have the right mind to cancel the surgery.  I like Dr. Amalia Hailey and your office but I am not impressed with this whole process with the surgical center."  I am so sorry.  Someone from the surgical center will call you tomorrow.  "Okay, thank you."

## 2018-02-18 NOTE — Telephone Encounter (Signed)
"  I have an appointment for Thursday, the 20th of this month.  I have yet to have heard a work from your office or the surgery center about what time I'm supposed to show up.  I'm supposed to arrange for transportation to get there but it's hard to let them know if I don't know.  Could somebody please call me and let me know what time I need to show up for surgery?"

## 2018-02-20 ENCOUNTER — Other Ambulatory Visit: Payer: Self-pay | Admitting: Podiatry

## 2018-02-20 ENCOUNTER — Telehealth: Payer: Self-pay | Admitting: Podiatry

## 2018-02-20 ENCOUNTER — Encounter: Payer: Self-pay | Admitting: Podiatry

## 2018-02-20 DIAGNOSIS — M2041 Other hammer toe(s) (acquired), right foot: Secondary | ICD-10-CM | POA: Diagnosis not present

## 2018-02-20 DIAGNOSIS — M7751 Other enthesopathy of right foot: Secondary | ICD-10-CM | POA: Diagnosis not present

## 2018-02-20 DIAGNOSIS — E7800 Pure hypercholesterolemia, unspecified: Secondary | ICD-10-CM | POA: Diagnosis not present

## 2018-02-20 MED ORDER — OXYCODONE-ACETAMINOPHEN 5-325 MG PO TABS: 1.0000 | ORAL_TABLET | Freq: Four times a day (QID) | ORAL | 0 refills | Status: DC | PRN

## 2018-02-20 NOTE — Telephone Encounter (Signed)
I informed pt of Dr. Amalia Hailey statement no antibiotic required.

## 2018-02-20 NOTE — Telephone Encounter (Signed)
No antibiotic required.

## 2018-02-20 NOTE — Progress Notes (Signed)
.  postop

## 2018-02-20 NOTE — Telephone Encounter (Signed)
I had surgery this morning and when I went to get my medicine they had the pain medication but not an antibiotic. Was I supposed to get an antibiotic or not?

## 2018-02-26 ENCOUNTER — Ambulatory Visit (INDEPENDENT_AMBULATORY_CARE_PROVIDER_SITE_OTHER): Payer: Self-pay | Admitting: Podiatry

## 2018-02-26 ENCOUNTER — Ambulatory Visit (INDEPENDENT_AMBULATORY_CARE_PROVIDER_SITE_OTHER): Payer: Medicare Other

## 2018-02-26 DIAGNOSIS — M2041 Other hammer toe(s) (acquired), right foot: Secondary | ICD-10-CM | POA: Diagnosis not present

## 2018-02-26 DIAGNOSIS — Z09 Encounter for follow-up examination after completed treatment for conditions other than malignant neoplasm: Secondary | ICD-10-CM

## 2018-03-02 NOTE — Progress Notes (Signed)
   Subjective:  Patient presents today status post hammertoe repair of the right 2nd digit. DOS: 02/20/2018. He states he is doing well. He denies any significant pain or modifying factors. He has been taking Ibuprofen for treatment. Patient is here for further evaluation and treatment.    Past Medical History:  Diagnosis Date  . Anxiety    while in hospital  . Depression   . GERD (gastroesophageal reflux disease)   . Irregular heartbeat    1980's.  . Pneumonia    last time 1993      Objective/Physical Exam Neurovascular status intact.  Skin incisions appear to be well coapted with sutures and staples intact. No sign of infectious process noted. No dehiscence. No active bleeding noted. Moderate edema noted to the surgical extremity.  Radiographic Exam:  Orthopedic hardware and osteotomies sites appear to be stable with routine healing.  Assessment: 1. s/p hammertoe repair 2nd digit right foot. DOS: 02/20/2018   Plan of Care:  1. Patient was evaluated. X-rays reviewed 2. Dressing changed. Keep clean, dry and intact for one week.  3. Continue weightbearing in post op shoe.  4. Return to clinic in one week.    Edrick Kins, DPM [facility]  Dr. Edrick Kins, [address]                                        [place], [place] [address]                Office [phone]  Fax [phone]

## 2018-03-05 ENCOUNTER — Ambulatory Visit (INDEPENDENT_AMBULATORY_CARE_PROVIDER_SITE_OTHER): Payer: Self-pay | Admitting: Podiatry

## 2018-03-05 DIAGNOSIS — Z09 Encounter for follow-up examination after completed treatment for conditions other than malignant neoplasm: Secondary | ICD-10-CM

## 2018-03-05 DIAGNOSIS — M2041 Other hammer toe(s) (acquired), right foot: Secondary | ICD-10-CM

## 2018-03-10 NOTE — Progress Notes (Signed)
   Subjective:  Patient presents today status post hammertoe repair of the right 2nd digit. DOS: 02/20/2018. He states he is doing well. He denies any significant pain or modifying factors. He has been using the post op shoe as directed. Patient is here for further evaluation and treatment.    Past Medical History:  Diagnosis Date  . Anxiety    while in hospital  . Depression   . GERD (gastroesophageal reflux disease)   . Irregular heartbeat    1980's.  . Pneumonia    last time 1993      Objective/Physical Exam Neurovascular status intact.  Skin incisions appear to be well coapted with sutures and staples intact. No sign of infectious process noted. No dehiscence. No active bleeding noted. Moderate edema noted to the surgical extremity.  Assessment: 1. s/p hammertoe repair 2nd digit right foot. DOS: 02/20/2018   Plan of Care:  1. Patient was evaluated.  2. Sutures removed.  3. Continue weightbearing in post op shoe.  4. Return to clinic in 2 weeks for pin removal.    Edrick Kins, DPM [facility]  Dr. Edrick Kins, [place]                                        [place], [place] [address]                Office [phone]  Fax [phone]

## 2018-03-19 ENCOUNTER — Other Ambulatory Visit: Payer: Self-pay

## 2018-03-19 ENCOUNTER — Encounter: Payer: Self-pay | Admitting: Podiatry

## 2018-03-19 ENCOUNTER — Ambulatory Visit (INDEPENDENT_AMBULATORY_CARE_PROVIDER_SITE_OTHER): Payer: Medicare Other | Admitting: Podiatry

## 2018-03-19 DIAGNOSIS — M2041 Other hammer toe(s) (acquired), right foot: Secondary | ICD-10-CM

## 2018-03-19 DIAGNOSIS — Z09 Encounter for follow-up examination after completed treatment for conditions other than malignant neoplasm: Secondary | ICD-10-CM

## 2018-03-24 NOTE — Progress Notes (Signed)
   Subjective:  Patient presents today status post hammertoe repair of the right 2nd digit. DOS: 02/20/2018. He states he is doing well. He denies any pain or modifying factors. He has been wearing the post op shoe as directed. Patient is here for further evaluation and treatment.    Past Medical History:  Diagnosis Date  . Anxiety    while in hospital  . Depression   . GERD (gastroesophageal reflux disease)   . Irregular heartbeat    1980's.  . Pneumonia    last time 1993      Objective/Physical Exam Neurovascular status intact.  Skin incisions appear to be healed. No sign of infectious process noted. No dehiscence. No active bleeding noted. Moderate edema noted to the surgical extremity.  Assessment: 1. s/p hammertoe repair 2nd digit right foot. DOS: 02/20/2018   Plan of Care:  1. Patient was evaluated.  2. Percutaneous pin removed.  3. Resume wearing good shoe gear.  4. Resume normal activity with no restrictions.  5. Return to clinic in 6 weeks.    Edrick Kins, DPM [facility]  Dr. Edrick Kins, [place]                                        [place], [place] [address]                Office [phone]  Fax [phone]

## 2018-04-02 ENCOUNTER — Ambulatory Visit (INDEPENDENT_AMBULATORY_CARE_PROVIDER_SITE_OTHER): Payer: Medicare Other | Admitting: Podiatry

## 2018-04-02 ENCOUNTER — Other Ambulatory Visit: Payer: Self-pay

## 2018-04-02 DIAGNOSIS — M76821 Posterior tibial tendinitis, right leg: Secondary | ICD-10-CM | POA: Diagnosis not present

## 2018-04-02 DIAGNOSIS — M2041 Other hammer toe(s) (acquired), right foot: Secondary | ICD-10-CM

## 2018-04-02 DIAGNOSIS — Z09 Encounter for follow-up examination after completed treatment for conditions other than malignant neoplasm: Secondary | ICD-10-CM

## 2018-04-02 DIAGNOSIS — M25571 Pain in right ankle and joints of right foot: Secondary | ICD-10-CM | POA: Diagnosis not present

## 2018-04-02 MED ORDER — MELOXICAM 15 MG PO TABS: 15.0000 mg | ORAL_TABLET | Freq: Every day | ORAL | 1 refills | Status: DC

## 2018-04-02 NOTE — Progress Notes (Signed)
   Subjective:  Patient presents today status post hammertoe repair of the right 2nd digit. DOS: 02/20/2018.  Patient states that he is doing very well.  He says that he does have some tenderness when walking up hills.  Tenderness is along the medial aspect of the ankle.  Otherwise patient has no new complaints.  Past Medical History:  Diagnosis Date  . Anxiety    while in hospital  . Depression   . GERD (gastroesophageal reflux disease)   . Irregular heartbeat    1980's.  . Pneumonia    last time 1993      Objective/Physical Exam Neurovascular status intact.  Skin incisions appear to be healed.  Negative for any edema or erythema.  There is some tenderness to palpation along the posterior aspect of the medial ankle consistent with a posterior tibial tendinitis  Assessment: 1. s/p hammertoe repair 2nd digit right foot. DOS: 02/20/2018 2.  Posterior tibial tendinitis   Plan of Care:  1. Patient was evaluated.  2.  Regarding the hammertoe surgery, patient may resume full activity no restrictions.  Recommend good shoe gear 3.  Patient states that he has an ankle brace at home.  Recommend wearing the ankle brace to support posterior tibial tendon. 4.  Prescription for meloxicam daily 5.  Return to clinic as needed posterior tibial tendinitis  Edrick Kins, DPM [facility]  Dr. Edrick Kins, [address]                                        [place], [place] [address]                Office [phone]  Fax [phone]

## 2018-04-30 ENCOUNTER — Ambulatory Visit (INDEPENDENT_AMBULATORY_CARE_PROVIDER_SITE_OTHER): Payer: Medicare Other

## 2018-04-30 ENCOUNTER — Other Ambulatory Visit: Payer: Self-pay

## 2018-04-30 ENCOUNTER — Ambulatory Visit (INDEPENDENT_AMBULATORY_CARE_PROVIDER_SITE_OTHER): Payer: Medicare Other | Admitting: Podiatry

## 2018-04-30 VITALS — Temp 97.3°F

## 2018-04-30 DIAGNOSIS — Z09 Encounter for follow-up examination after completed treatment for conditions other than malignant neoplasm: Secondary | ICD-10-CM | POA: Diagnosis not present

## 2018-04-30 DIAGNOSIS — M2041 Other hammer toe(s) (acquired), right foot: Secondary | ICD-10-CM

## 2018-04-30 DIAGNOSIS — M722 Plantar fascial fibromatosis: Secondary | ICD-10-CM

## 2018-04-30 DIAGNOSIS — M76821 Posterior tibial tendinitis, right leg: Secondary | ICD-10-CM

## 2018-04-30 NOTE — Progress Notes (Signed)
   Subjective: 73 y.o. male presents today for evaluation of a new complaint regarding right heel pain.  Patient is status post hammertoe repair of the right second digit. DOS: 02/20/2018.  Patient states that the pain is been ongoing for several weeks now.  He is an avid walker and walks several miles per day.  Pain is aggravated when walking and alleviated with rest.  He has a history of heel spurs to the right foot and he believes it is a flareup of his plantar fasciitis.   Past Medical History:  Diagnosis Date  . Anxiety    while in hospital  . Depression   . GERD (gastroesophageal reflux disease)   . Irregular heartbeat    1980's.  . Pneumonia    last time 1993     Objective: Physical Exam General: The patient is alert and oriented x3 in no acute distress.  Dermatology: Skin is warm, dry and supple bilateral lower extremities. Negative for open lesions or macerations bilateral.   Vascular: Dorsalis Pedis and Posterior Tibial pulses palpable bilateral.  Capillary fill time is immediate to all digits.  Neurological: Epicritic and protective threshold intact bilateral.   Musculoskeletal: Tenderness to palpation to the plantar aspect of the right heel along the plantar fascia. All other joints range of motion within normal limits bilateral. Strength 5/5 in all groups bilateral.   Radiographic exam: Normal osseous mineralization. Joint spaces preserved. No fracture/dislocation/boney destruction. No other soft tissue abnormalities or radiopaque foreign bodies.   Assessment: 1. Plantar fasciitis right 2.  Status post hammertoe repair second digit right foot. DOS: 02/20/2018  Plan of Care:  1. Patient evaluated. Xrays reviewed.   2. Injection of 0.5cc Celestone soluspan injected into the right plantar fascia  3.  Continue meloxicam daily 5.  Recommend good supportive sneakers 6. Instructed patient regarding therapies and modalities at home to alleviate symptoms.  7. Return to  clinic as needed   Edrick Kins, DPM [facility]  Dr. Edrick Kins, DPM    [address], [place] [address]                Office [phone]  Fax [phone]

## 2018-05-22 DIAGNOSIS — G603 Idiopathic progressive neuropathy: Secondary | ICD-10-CM | POA: Diagnosis not present

## 2018-05-22 DIAGNOSIS — G5623 Lesion of ulnar nerve, bilateral upper limbs: Secondary | ICD-10-CM | POA: Diagnosis not present

## 2018-05-22 DIAGNOSIS — M545 Low back pain: Secondary | ICD-10-CM | POA: Diagnosis not present

## 2018-05-22 DIAGNOSIS — G5603 Carpal tunnel syndrome, bilateral upper limbs: Secondary | ICD-10-CM | POA: Diagnosis not present

## 2018-08-28 DIAGNOSIS — G5601 Carpal tunnel syndrome, right upper limb: Secondary | ICD-10-CM | POA: Diagnosis not present

## 2018-08-28 DIAGNOSIS — G603 Idiopathic progressive neuropathy: Secondary | ICD-10-CM | POA: Diagnosis not present

## 2018-08-28 DIAGNOSIS — G5621 Lesion of ulnar nerve, right upper limb: Secondary | ICD-10-CM | POA: Diagnosis not present

## 2018-08-28 DIAGNOSIS — G3184 Mild cognitive impairment, so stated: Secondary | ICD-10-CM | POA: Diagnosis not present

## 2018-08-28 DIAGNOSIS — M5417 Radiculopathy, lumbosacral region: Secondary | ICD-10-CM | POA: Diagnosis not present

## 2018-08-28 DIAGNOSIS — R202 Paresthesia of skin: Secondary | ICD-10-CM | POA: Diagnosis not present

## 2018-09-09 DIAGNOSIS — G8929 Other chronic pain: Secondary | ICD-10-CM | POA: Diagnosis not present

## 2018-09-09 DIAGNOSIS — Z125 Encounter for screening for malignant neoplasm of prostate: Secondary | ICD-10-CM | POA: Diagnosis not present

## 2018-09-09 DIAGNOSIS — Z1389 Encounter for screening for other disorder: Secondary | ICD-10-CM | POA: Diagnosis not present

## 2018-09-09 DIAGNOSIS — M5441 Lumbago with sciatica, right side: Secondary | ICD-10-CM | POA: Diagnosis not present

## 2018-09-09 DIAGNOSIS — Z79899 Other long term (current) drug therapy: Secondary | ICD-10-CM | POA: Diagnosis not present

## 2018-09-09 DIAGNOSIS — R0789 Other chest pain: Secondary | ICD-10-CM | POA: Diagnosis not present

## 2018-09-09 DIAGNOSIS — K9089 Other intestinal malabsorption: Secondary | ICD-10-CM | POA: Diagnosis not present

## 2018-09-09 DIAGNOSIS — Z23 Encounter for immunization: Secondary | ICD-10-CM | POA: Diagnosis not present

## 2018-09-09 DIAGNOSIS — E669 Obesity, unspecified: Secondary | ICD-10-CM | POA: Diagnosis not present

## 2018-09-09 DIAGNOSIS — E7800 Pure hypercholesterolemia, unspecified: Secondary | ICD-10-CM | POA: Diagnosis not present

## 2018-09-09 DIAGNOSIS — I517 Cardiomegaly: Secondary | ICD-10-CM | POA: Diagnosis not present

## 2018-09-09 DIAGNOSIS — K219 Gastro-esophageal reflux disease without esophagitis: Secondary | ICD-10-CM | POA: Diagnosis not present

## 2018-09-09 DIAGNOSIS — Z0000 Encounter for general adult medical examination without abnormal findings: Secondary | ICD-10-CM | POA: Diagnosis not present

## 2018-09-12 ENCOUNTER — Other Ambulatory Visit (HOSPITAL_COMMUNITY): Payer: Self-pay | Admitting: Internal Medicine

## 2018-09-12 DIAGNOSIS — I517 Cardiomegaly: Secondary | ICD-10-CM

## 2018-09-15 ENCOUNTER — Ambulatory Visit (HOSPITAL_COMMUNITY): Payer: Medicare Other | Attending: Cardiology

## 2018-09-15 ENCOUNTER — Other Ambulatory Visit: Payer: Self-pay

## 2018-09-15 DIAGNOSIS — I517 Cardiomegaly: Secondary | ICD-10-CM | POA: Diagnosis not present

## 2018-09-15 MED ORDER — PERFLUTREN LIPID MICROSPHERE
1.0000 mL | INTRAVENOUS | Status: AC | PRN
  Administered 2018-09-15: 2 mL via INTRAVENOUS

## 2018-12-04 DIAGNOSIS — M545 Low back pain: Secondary | ICD-10-CM | POA: Diagnosis not present

## 2018-12-04 DIAGNOSIS — G5601 Carpal tunnel syndrome, right upper limb: Secondary | ICD-10-CM | POA: Diagnosis not present

## 2018-12-04 DIAGNOSIS — G3184 Mild cognitive impairment, so stated: Secondary | ICD-10-CM | POA: Diagnosis not present

## 2018-12-04 DIAGNOSIS — G603 Idiopathic progressive neuropathy: Secondary | ICD-10-CM | POA: Diagnosis not present

## 2019-01-15 DIAGNOSIS — M7731 Calcaneal spur, right foot: Secondary | ICD-10-CM | POA: Diagnosis not present

## 2019-01-15 DIAGNOSIS — M722 Plantar fascial fibromatosis: Secondary | ICD-10-CM | POA: Diagnosis not present

## 2019-01-22 DIAGNOSIS — M71571 Other bursitis, not elsewhere classified, right ankle and foot: Secondary | ICD-10-CM | POA: Diagnosis not present

## 2019-01-22 DIAGNOSIS — M722 Plantar fascial fibromatosis: Secondary | ICD-10-CM | POA: Diagnosis not present

## 2019-02-17 ENCOUNTER — Encounter: Payer: Self-pay | Admitting: Podiatry

## 2019-02-22 ENCOUNTER — Ambulatory Visit: Payer: Medicare Other | Attending: Internal Medicine

## 2019-02-22 DIAGNOSIS — Z23 Encounter for immunization: Secondary | ICD-10-CM | POA: Insufficient documentation

## 2019-02-22 NOTE — Progress Notes (Signed)
   Covid-19 Vaccination Clinic  Name:  Kevin Kim    MRN: [id number] DOB: 07-29-1945  02/22/2019  [name] was observed post Covid-19 immunization for 15 minutes without incidence. He was provided with Vaccine Information Sheet and instruction to access the V-Safe system.   [name] was instructed to call 911 with any severe reactions post vaccine: Marland Kitchen Difficulty breathing  . Swelling of your face and throat  . A fast heartbeat  . A bad rash all over your body  . Dizziness and weakness    Immunizations Administered    Name Date Dose VIS Date Route   Pfizer COVID-19 Vaccine 02/22/2019 11:33 AM 0.3 mL 12/12/2018 Intramuscular   Manufacturer: [place]   Lot: [id number]   [place]: [id number]

## 2019-03-12 DIAGNOSIS — M545 Low back pain: Secondary | ICD-10-CM | POA: Diagnosis not present

## 2019-03-12 DIAGNOSIS — G603 Idiopathic progressive neuropathy: Secondary | ICD-10-CM | POA: Diagnosis not present

## 2019-03-12 DIAGNOSIS — G3184 Mild cognitive impairment, so stated: Secondary | ICD-10-CM | POA: Diagnosis not present

## 2019-03-18 ENCOUNTER — Ambulatory Visit: Payer: Medicare Other | Attending: Internal Medicine

## 2019-03-18 DIAGNOSIS — Z23 Encounter for immunization: Secondary | ICD-10-CM

## 2019-03-18 NOTE — Progress Notes (Signed)
   Covid-19 Vaccination Clinic  Name:  JAQUEZ SEGRAVES    MRN: [id number] DOB: July 06, 1945  03/18/2019  Mr. Slaght was observed post Covid-19 immunization for 15 minutes without incident. He was provided with Vaccine Information Sheet and instruction to access the V-Safe system.   Mr. Stones was instructed to call 911 with any severe reactions post vaccine: Marland Kitchen Difficulty breathing  . Swelling of face and throat  . A fast heartbeat  . A bad rash all over body  . Dizziness and weakness   Immunizations Administered    Name Date Dose VIS Date Route   Pfizer COVID-19 Vaccine 03/18/2019  2:41 PM 0.3 mL 12/12/2018 Intramuscular   Manufacturer: [place]   Lot: [id number]   [place]: [id number]

## 2019-06-08 ENCOUNTER — Emergency Department (HOSPITAL_BASED_OUTPATIENT_CLINIC_OR_DEPARTMENT_OTHER)
Admission: EM | Admit: 2019-06-08 | Discharge: 2019-06-08 | Disposition: A | Discharge status: Home / Self Care | Payer: Medicare Other | Attending: Emergency Medicine | Admitting: Emergency Medicine

## 2019-06-08 ENCOUNTER — Other Ambulatory Visit: Payer: Self-pay

## 2019-06-08 ENCOUNTER — Encounter (HOSPITAL_BASED_OUTPATIENT_CLINIC_OR_DEPARTMENT_OTHER): Payer: Self-pay

## 2019-06-08 DIAGNOSIS — Z880 Allergy status to penicillin: Secondary | ICD-10-CM | POA: Diagnosis not present

## 2019-06-08 DIAGNOSIS — Y998 Other external cause status: Secondary | ICD-10-CM | POA: Diagnosis not present

## 2019-06-08 DIAGNOSIS — S30850A Superficial foreign body of lower back and pelvis, initial encounter: Secondary | ICD-10-CM | POA: Diagnosis not present

## 2019-06-08 DIAGNOSIS — W458XXA Other foreign body or object entering through skin, initial encounter: Secondary | ICD-10-CM | POA: Diagnosis not present

## 2019-06-08 DIAGNOSIS — Y9389 Activity, other specified: Secondary | ICD-10-CM | POA: Insufficient documentation

## 2019-06-08 DIAGNOSIS — S30861A Insect bite (nonvenomous) of abdominal wall, initial encounter: Secondary | ICD-10-CM | POA: Diagnosis not present

## 2019-06-08 DIAGNOSIS — S30860A Insect bite (nonvenomous) of lower back and pelvis, initial encounter: Secondary | ICD-10-CM | POA: Insufficient documentation

## 2019-06-08 DIAGNOSIS — Z87891 Personal history of nicotine dependence: Secondary | ICD-10-CM | POA: Insufficient documentation

## 2019-06-08 DIAGNOSIS — S3085AA Superficial foreign body of flank, initial encounter: Secondary | ICD-10-CM

## 2019-06-08 DIAGNOSIS — S30851A Superficial foreign body of abdominal wall, initial encounter: Secondary | ICD-10-CM

## 2019-06-08 DIAGNOSIS — Y9289 Other specified places as the place of occurrence of the external cause: Secondary | ICD-10-CM | POA: Insufficient documentation

## 2019-06-08 MED ORDER — DOXYCYCLINE HYCLATE 100 MG PO CAPS: 100.0000 mg | ORAL_CAPSULE | Freq: Two times a day (BID) | ORAL | 0 refills | Status: DC

## 2019-06-08 NOTE — ED Provider Notes (Signed)
[name] EMERGENCY DEPARTMENT Provider Note   CSN: [id number] Arrival date & time: 06/08/19  1242     History Chief Complaint  Patient presents with  . Rash    Kevin Kim is a 75 y.o. male.  74 yo M with a chief complaints of a rash to his right low back. He noticed this yesterday. Ended up turning red and he felt like he had a mole that was there. He was concerned that it may be cancerous. No significant pain there. Denies fevers or joint pains.  The history is provided by the patient.  Rash Location:  Torso Torso rash location:  R flank Quality: redness   Severity:  Moderate Onset quality:  Gradual Duration:  1 day Timing:  Constant Progression:  Worsening Chronicity:  New Relieved by:  Nothing Worsened by:  Nothing Ineffective treatments:  None tried Associated symptoms: no abdominal pain, no diarrhea, no fever, no headaches, no joint pain, no myalgias, no shortness of breath and not vomiting        Past Medical History:  Diagnosis Date  . Anxiety    while in hospital  . Depression   . GERD (gastroesophageal reflux disease)   . Irregular heartbeat    1980's.  . Pneumonia    last time 1993    Patient Active Problem List   Diagnosis Date Noted  . 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 Anxiety   . Depression   . Fracture of distal end of right tibia 01/19/2014  . GERD (gastroesophageal reflux disease) 10/30/2013  . Fall from ladder 10/30/2013  . Acute urinary retention 10/30/2013  . Open fracture of tibial plafond with fibula involvement 10/24/2013    Past Surgical History:  Procedure Laterality Date  . CLOSED REDUCTION TIBIA Right 10/24/2013   Procedure: CLOSED REDUCTION TIBIA/FIBULA FRACTURE;  Surgeon: Mauri Pole, MD;  Location: [facility];  Service: Orthopedics;  Laterality: Right;  . COLONOSCOPY    . COSMETIC SURGERY Right    at [facility] 11/15/2013    left wrist to right  ankle   . EXTERNAL FIXATION LEG Right 10/24/2013   Procedure: EXTERNAL FIXATION RIGHT LOWER LEG;  Surgeon: Mauri Pole, MD;  Location: [facility];  Service: Orthopedics;  Laterality: Right;  . EXTERNAL FIXATION LEG Right 10/27/2013   Procedure:   REVISION OF  EXTERNAL FIXATOR RIGHT LOWER LEG   ORIF  RIGHT TIBIAL PILON APPLICATION OF WOUND VAC;  Surgeon: Rozanna Box, MD;  Location: [facility];  Service: Orthopedics;  Laterality: Right;  . HERNIA REPAIR Bilateral [id number]   plus umbilicial-   . I & D EXTREMITY Right 10/24/2013   Procedure: IRRIGATION AND DEBRIDEMENT OPEN RIGHT TIBIA/FIBULA FRACTURE;  Surgeon: Mauri Pole, MD;  Location: [facility];  Service: Orthopedics;  Laterality: Right;  . ORIF TIBIA FRACTURE Right 01/2014  . ORIF TIBIA FRACTURE Right 01/19/2014   Procedure: OPEN REDUCTION INTERNAL FIXATION (ORIF) TIBIA/FIBULA FRACTURE REPAIR OF TIBIA/FIBULA NON UNION;  Surgeon: Rozanna Box, MD;  Location: [facility];  Service: Orthopedics;  Laterality: Right;       Family History  Problem Relation Age of Onset  . Cancer Mother   . Cancer Father     Social History   Tobacco Use  . Smoking status: Former Research scientist (life sciences)  . Smokeless tobacco: Never Used  . Tobacco comment: quit in 1970's only smoke occ.  Substance Use Topics  . Alcohol use: Yes  Comment: daily  . Drug use: No    Home Medications Prior to Admission medications   Medication Sig Start Date End Date Taking? Authorizing Provider  aspirin 81 MG chewable tablet Chew 81 mg by mouth every morning.    [provider]  atorvastatin (LIPITOR) 10 MG tablet  04/16/18   [provider]  Cholecalciferol 2000 UNITS CAPS Take 1 capsule (2,000 Units total) by mouth daily. 01/20/14   Ainsley Spinner, PA-C  doxycycline (VIBRAMYCIN) 100 MG capsule Take 1 capsule (100 mg total) by mouth 2 (two) times daily. One po bid x 7 days 06/08/19   Deno Etienne, DO  esomeprazole (NEXIUM) 40 MG capsule  04/02/18   [provider]  hydrOXYzine  (ATARAX/VISTARIL) 50 MG tablet Take 1 tablet (50 mg total) by mouth 3 (three) times daily as needed for itching, anxiety or nausea. 10/30/13   Ainsley Spinner, PA-C  lansoprazole (PREVACID) 30 MG capsule Take 30 mg by mouth every morning.     [provider]  meloxicam (MOBIC) 15 MG tablet Take 1 tablet (15 mg total) by mouth daily. 04/02/18   Edrick Kins, DPM  methocarbamol (ROBAXIN) 500 MG tablet Take 1-2 tablets (500-1,000 mg total) by mouth every 6 (six) hours as needed for muscle spasms. 01/20/14   Ainsley Spinner, PA-C  Multiple Vitamins-Minerals (MULTIVITAMINS THER. W/MINERALS) TABS Take 1 tablet by mouth every morning. Centrum Silver    [provider]  oxyCODONE (OXY IR/ROXICODONE) 5 MG immediate release tablet Take 1-2 tablets (5-10 mg total) by mouth every 6 (six) hours as needed for breakthrough pain (take between percocet). 01/20/14   Ainsley Spinner, PA-C  oxyCODONE-acetaminophen (PERCOCET) 5-325 MG tablet Take 1 tablet by mouth every 6 (six) hours as needed for severe pain. 02/20/18   Edrick Kins, DPM  pregabalin (LYRICA) 100 MG capsule  04/22/18   [provider]  pregabalin (LYRICA) 75 MG capsule Take 1 capsule (75 mg total) by mouth 2 (two) times daily. 10/30/13   Ainsley Spinner, PA-C  tamsulosin (FLOMAX) 0.4 MG CAPS capsule Take 1 capsule (0.4 mg total) by mouth daily after supper. 10/30/13   Ainsley Spinner, PA-C  venlafaxine (EFFEXOR) 37.5 MG tablet Take 1 tablet by mouth daily. 12/14/13   [provider]  Vitamin D, Ergocalciferol, (DRISDOL) 50000 UNITS CAPS capsule Take 50,000 Units by mouth once a week. On Fridays 01/03/13   [provider]    Allergies    Amoxicillin and Peanut-containing drug products  Review of Systems   Review of Systems  Constitutional: Negative for chills and fever.  HENT: Negative for congestion and facial swelling.   Eyes: Negative for discharge and visual disturbance.  Respiratory: Negative for shortness of breath.     Cardiovascular: Negative for chest pain and palpitations.  Gastrointestinal: Negative for abdominal pain, diarrhea and vomiting.  Musculoskeletal: Negative for arthralgias and myalgias.  Skin: Positive for rash. Negative for color change.  Neurological: Negative for tremors, syncope and headaches.  Psychiatric/Behavioral: Negative for confusion and dysphoric mood.    Physical Exam Updated Vital Signs BP (!) 143/71 (BP Location: Left Arm)   Pulse 64   Temp 98.5 F (36.9 C) (Oral)   Resp 20   Ht 5\' 8"  (1.727 m)   Wt 92.1 kg   SpO2 99%   BMI 30.87 kg/m   Physical Exam Vitals and nursing note reviewed.  Constitutional:      Appearance: He is well-developed.  HENT:     Head: Normocephalic and atraumatic.  Eyes:     Pupils: Pupils are equal, round, and reactive to light.  Neck:     Vascular: No JVD.  Cardiovascular:     Rate and Rhythm: Normal rate and regular rhythm.     Heart sounds: No murmur. No friction rub. No gallop.   Pulmonary:     Effort: No respiratory distress.     Breath sounds: No wheezing.  Abdominal:     General: There is no distension.     Tenderness: There is no guarding or rebound.  Musculoskeletal:        General: Normal range of motion.     Cervical back: Normal range of motion and neck supple.     Comments: Right flank with surrounding erythema and an embedded tick.  Skin:    Coloration: Skin is not pale.     Findings: No rash.  Neurological:     Mental Status: He is alert and oriented to person, place, and time.  Psychiatric:        Behavior: Behavior normal.     ED Results / Procedures / Treatments   Labs (all labs ordered are listed, but only abnormal results are displayed) Labs Reviewed - No data to display  EKG None  Radiology No results found.  Procedures .Foreign Body Removal  Date/Time: 06/08/2019 1:12 PM Performed by: Deno Etienne, DO Authorized by: Deno Etienne, DO  Consent: Verbal consent obtained. Risks and benefits:  risks, benefits and alternatives were discussed Consent given by: patient Site marked: the operative site was not marked Imaging studies: imaging studies not available Patient identity confirmed: verbally with patient Time out: Immediately prior to procedure a "time out" was called to verify the correct patient, procedure, equipment, support staff and site/side marked as required. Body area: skin General location: trunk Location details: back  Sedation: Patient sedated: no  Patient restrained: no Patient cooperative: yes Complexity: simple 1 objects recovered. Objects recovered: tick Post-procedure assessment: foreign body removed Patient tolerance: patient tolerated the procedure well with no immediate complications   (including critical care time)  Medications Ordered in ED Medications - No data to display  ED Course  I have reviewed the triage vital signs and the nursing notes.  Pertinent labs & imaging results that were available during my care of the patient were reviewed by me and considered in my medical decision making (see chart for details).    MDM Rules/Calculators/A&P                      74 yo M with a chief complaints of a rash to his skin. Patient has a tick that is embedded to his back. This was removed at bedside. With surrounding erythema will start on doxycycline. PCP follow-up.  1:13 PM:  I have discussed the diagnosis/risks/treatment options with the patient and believe the pt to be eligible for discharge home to follow-up with PCP. We also discussed returning to the ED immediately if new or worsening sx occur. We discussed the sx which are most concerning (e.g., sudden worsening pain, fever, inability to tolerate by mouth) that necessitate immediate return. Medications administered to the patient during their visit and any new prescriptions provided to the patient are listed below.  Medications given during this visit Medications - No data to  display   The patient appears reasonably screen and/or stabilized for discharge and I doubt any other medical condition or other [facility] requiring further screening, evaluation, or treatment in the ED at this time  prior to discharge.   Final Clinical Impression(s) / ED Diagnoses Final diagnoses:  Embedded tick of flank, initial encounter    Rx / DC Orders ED Discharge Orders         Ordered    doxycycline (VIBRAMYCIN) 100 MG capsule  2 times daily     06/08/19 [place], Corlette Ciano, DO 06/08/19 1313

## 2019-06-08 NOTE — ED Triage Notes (Signed)
Pt c/o scattered rash first noticed yesterday-states he also has a ?mole vs tick bite to back that he noticed yesterday-NAD-steady gait

## 2019-06-08 NOTE — Discharge Instructions (Signed)
Follow up with your family doc.  Return for fevers, rapid spreading redness.

## 2019-09-21 DIAGNOSIS — Z79899 Other long term (current) drug therapy: Secondary | ICD-10-CM | POA: Diagnosis not present

## 2019-09-21 DIAGNOSIS — Z0000 Encounter for general adult medical examination without abnormal findings: Secondary | ICD-10-CM | POA: Diagnosis not present

## 2019-09-21 DIAGNOSIS — E7800 Pure hypercholesterolemia, unspecified: Secondary | ICD-10-CM | POA: Diagnosis not present

## 2019-09-21 DIAGNOSIS — F325 Major depressive disorder, single episode, in full remission: Secondary | ICD-10-CM | POA: Diagnosis not present

## 2019-09-21 DIAGNOSIS — D649 Anemia, unspecified: Secondary | ICD-10-CM | POA: Diagnosis not present

## 2019-09-21 DIAGNOSIS — K219 Gastro-esophageal reflux disease without esophagitis: Secondary | ICD-10-CM | POA: Diagnosis not present

## 2019-09-21 DIAGNOSIS — K9089 Other intestinal malabsorption: Secondary | ICD-10-CM | POA: Diagnosis not present

## 2019-09-21 DIAGNOSIS — Z1389 Encounter for screening for other disorder: Secondary | ICD-10-CM | POA: Diagnosis not present

## 2019-09-24 DIAGNOSIS — M545 Low back pain: Secondary | ICD-10-CM | POA: Diagnosis not present

## 2019-09-24 DIAGNOSIS — G3184 Mild cognitive impairment, so stated: Secondary | ICD-10-CM | POA: Diagnosis not present

## 2019-09-24 DIAGNOSIS — Z760 Encounter for issue of repeat prescription: Secondary | ICD-10-CM | POA: Diagnosis not present

## 2019-10-19 DIAGNOSIS — K5900 Constipation, unspecified: Secondary | ICD-10-CM | POA: Diagnosis not present

## 2019-10-19 DIAGNOSIS — R350 Frequency of micturition: Secondary | ICD-10-CM | POA: Diagnosis not present

## 2019-10-19 DIAGNOSIS — D509 Iron deficiency anemia, unspecified: Secondary | ICD-10-CM | POA: Diagnosis not present

## 2019-11-10 DIAGNOSIS — Z1159 Encounter for screening for other viral diseases: Secondary | ICD-10-CM | POA: Diagnosis not present

## 2019-11-13 DIAGNOSIS — D120 Benign neoplasm of cecum: Secondary | ICD-10-CM | POA: Diagnosis not present

## 2019-11-13 DIAGNOSIS — D123 Benign neoplasm of transverse colon: Secondary | ICD-10-CM | POA: Diagnosis not present

## 2019-11-13 DIAGNOSIS — D122 Benign neoplasm of ascending colon: Secondary | ICD-10-CM | POA: Diagnosis not present

## 2019-11-13 DIAGNOSIS — D509 Iron deficiency anemia, unspecified: Secondary | ICD-10-CM | POA: Diagnosis not present

## 2019-11-17 DIAGNOSIS — D122 Benign neoplasm of ascending colon: Secondary | ICD-10-CM | POA: Diagnosis not present

## 2019-11-17 DIAGNOSIS — D123 Benign neoplasm of transverse colon: Secondary | ICD-10-CM | POA: Diagnosis not present

## 2019-11-17 DIAGNOSIS — D120 Benign neoplasm of cecum: Secondary | ICD-10-CM | POA: Diagnosis not present

## 2019-12-15 DIAGNOSIS — D509 Iron deficiency anemia, unspecified: Secondary | ICD-10-CM | POA: Diagnosis not present

## 2019-12-17 DIAGNOSIS — M545 Low back pain, unspecified: Secondary | ICD-10-CM | POA: Diagnosis not present

## 2019-12-17 DIAGNOSIS — M25562 Pain in left knee: Secondary | ICD-10-CM | POA: Diagnosis not present

## 2019-12-17 DIAGNOSIS — Z79899 Other long term (current) drug therapy: Secondary | ICD-10-CM | POA: Diagnosis not present

## 2019-12-17 DIAGNOSIS — R201 Hypoesthesia of skin: Secondary | ICD-10-CM | POA: Diagnosis not present

## 2020-03-18 DIAGNOSIS — G603 Idiopathic progressive neuropathy: Secondary | ICD-10-CM | POA: Diagnosis not present

## 2020-03-18 DIAGNOSIS — G3184 Mild cognitive impairment, so stated: Secondary | ICD-10-CM | POA: Diagnosis not present

## 2020-03-18 DIAGNOSIS — G5603 Carpal tunnel syndrome, bilateral upper limbs: Secondary | ICD-10-CM | POA: Diagnosis not present

## 2020-03-18 DIAGNOSIS — G5623 Lesion of ulnar nerve, bilateral upper limbs: Secondary | ICD-10-CM | POA: Diagnosis not present

## 2020-03-18 DIAGNOSIS — R202 Paresthesia of skin: Secondary | ICD-10-CM | POA: Diagnosis not present

## 2020-03-18 DIAGNOSIS — M5417 Radiculopathy, lumbosacral region: Secondary | ICD-10-CM | POA: Diagnosis not present

## 2020-06-10 DIAGNOSIS — M545 Low back pain, unspecified: Secondary | ICD-10-CM | POA: Diagnosis not present

## 2020-06-10 DIAGNOSIS — G5603 Carpal tunnel syndrome, bilateral upper limbs: Secondary | ICD-10-CM | POA: Diagnosis not present

## 2020-06-10 DIAGNOSIS — G3184 Mild cognitive impairment, so stated: Secondary | ICD-10-CM | POA: Diagnosis not present

## 2020-06-10 DIAGNOSIS — R202 Paresthesia of skin: Secondary | ICD-10-CM | POA: Diagnosis not present

## 2020-07-08 DIAGNOSIS — Z20822 Contact with and (suspected) exposure to covid-19: Secondary | ICD-10-CM | POA: Diagnosis not present

## 2020-08-01 DIAGNOSIS — R9431 Abnormal electrocardiogram [ECG] [EKG]: Secondary | ICD-10-CM | POA: Diagnosis not present

## 2020-08-01 DIAGNOSIS — R519 Headache, unspecified: Secondary | ICD-10-CM | POA: Diagnosis not present

## 2020-08-01 DIAGNOSIS — E7800 Pure hypercholesterolemia, unspecified: Secondary | ICD-10-CM | POA: Diagnosis not present

## 2020-08-01 DIAGNOSIS — R202 Paresthesia of skin: Secondary | ICD-10-CM | POA: Diagnosis not present

## 2020-08-01 DIAGNOSIS — I10 Essential (primary) hypertension: Secondary | ICD-10-CM | POA: Diagnosis not present

## 2020-08-02 ENCOUNTER — Other Ambulatory Visit: Payer: Self-pay | Admitting: Geriatric Medicine

## 2020-08-02 DIAGNOSIS — R519 Headache, unspecified: Secondary | ICD-10-CM

## 2020-08-05 DIAGNOSIS — I6523 Occlusion and stenosis of bilateral carotid arteries: Secondary | ICD-10-CM | POA: Diagnosis not present

## 2020-08-05 DIAGNOSIS — I10 Essential (primary) hypertension: Secondary | ICD-10-CM | POA: Diagnosis not present

## 2020-08-05 DIAGNOSIS — R202 Paresthesia of skin: Secondary | ICD-10-CM | POA: Diagnosis not present

## 2020-08-05 DIAGNOSIS — R519 Headache, unspecified: Secondary | ICD-10-CM | POA: Diagnosis not present

## 2020-08-15 DIAGNOSIS — I10 Essential (primary) hypertension: Secondary | ICD-10-CM | POA: Diagnosis not present

## 2020-08-15 DIAGNOSIS — R202 Paresthesia of skin: Secondary | ICD-10-CM | POA: Diagnosis not present

## 2020-08-19 ENCOUNTER — Ambulatory Visit
Admission: RE | Admit: 2020-08-19 | Discharge: 2020-08-19 | Disposition: A | Discharge status: Home / Self Care | Payer: Medicare Other | Source: Ambulatory Visit | Attending: Geriatric Medicine | Admitting: Geriatric Medicine

## 2020-08-19 ENCOUNTER — Other Ambulatory Visit: Payer: Self-pay

## 2020-08-19 DIAGNOSIS — R519 Headache, unspecified: Secondary | ICD-10-CM | POA: Diagnosis not present

## 2020-08-19 DIAGNOSIS — I10 Essential (primary) hypertension: Secondary | ICD-10-CM | POA: Diagnosis not present

## 2020-09-06 DIAGNOSIS — I10 Essential (primary) hypertension: Secondary | ICD-10-CM | POA: Diagnosis not present

## 2020-09-22 DIAGNOSIS — G5603 Carpal tunnel syndrome, bilateral upper limbs: Secondary | ICD-10-CM | POA: Diagnosis not present

## 2020-09-22 DIAGNOSIS — Z760 Encounter for issue of repeat prescription: Secondary | ICD-10-CM | POA: Diagnosis not present

## 2020-09-22 DIAGNOSIS — R252 Cramp and spasm: Secondary | ICD-10-CM | POA: Diagnosis not present

## 2020-09-22 DIAGNOSIS — M5441 Lumbago with sciatica, right side: Secondary | ICD-10-CM | POA: Diagnosis not present

## 2020-09-22 DIAGNOSIS — G3184 Mild cognitive impairment, so stated: Secondary | ICD-10-CM | POA: Diagnosis not present

## 2020-09-22 DIAGNOSIS — M5442 Lumbago with sciatica, left side: Secondary | ICD-10-CM | POA: Diagnosis not present

## 2020-10-25 DIAGNOSIS — Z79899 Other long term (current) drug therapy: Secondary | ICD-10-CM | POA: Diagnosis not present

## 2020-10-25 DIAGNOSIS — Z1389 Encounter for screening for other disorder: Secondary | ICD-10-CM | POA: Diagnosis not present

## 2020-10-25 DIAGNOSIS — K219 Gastro-esophageal reflux disease without esophagitis: Secondary | ICD-10-CM | POA: Diagnosis not present

## 2020-10-25 DIAGNOSIS — I10 Essential (primary) hypertension: Secondary | ICD-10-CM | POA: Diagnosis not present

## 2020-10-25 DIAGNOSIS — Z0000 Encounter for general adult medical examination without abnormal findings: Secondary | ICD-10-CM | POA: Diagnosis not present

## 2020-10-25 DIAGNOSIS — D692 Other nonthrombocytopenic purpura: Secondary | ICD-10-CM | POA: Diagnosis not present

## 2020-10-25 DIAGNOSIS — E7800 Pure hypercholesterolemia, unspecified: Secondary | ICD-10-CM | POA: Diagnosis not present

## 2020-10-25 DIAGNOSIS — K9089 Other intestinal malabsorption: Secondary | ICD-10-CM | POA: Diagnosis not present

## 2020-11-21 DIAGNOSIS — Z20822 Contact with and (suspected) exposure to covid-19: Secondary | ICD-10-CM | POA: Diagnosis not present

## 2021-01-12 DIAGNOSIS — G603 Idiopathic progressive neuropathy: Secondary | ICD-10-CM | POA: Diagnosis not present

## 2021-01-12 DIAGNOSIS — M25562 Pain in left knee: Secondary | ICD-10-CM | POA: Diagnosis not present

## 2021-01-12 DIAGNOSIS — M5441 Lumbago with sciatica, right side: Secondary | ICD-10-CM | POA: Diagnosis not present

## 2021-01-12 DIAGNOSIS — M5442 Lumbago with sciatica, left side: Secondary | ICD-10-CM | POA: Diagnosis not present

## 2021-01-12 DIAGNOSIS — G3184 Mild cognitive impairment, so stated: Secondary | ICD-10-CM | POA: Diagnosis not present

## 2021-01-12 DIAGNOSIS — Z760 Encounter for issue of repeat prescription: Secondary | ICD-10-CM | POA: Diagnosis not present

## 2021-03-08 DIAGNOSIS — Z20822 Contact with and (suspected) exposure to covid-19: Secondary | ICD-10-CM | POA: Diagnosis not present

## 2021-03-21 DIAGNOSIS — J069 Acute upper respiratory infection, unspecified: Secondary | ICD-10-CM | POA: Diagnosis not present

## 2021-03-31 DIAGNOSIS — Z20822 Contact with and (suspected) exposure to covid-19: Secondary | ICD-10-CM | POA: Diagnosis not present

## 2021-04-04 DIAGNOSIS — D2271 Melanocytic nevi of right lower limb, including hip: Secondary | ICD-10-CM | POA: Diagnosis not present

## 2021-04-04 DIAGNOSIS — L918 Other hypertrophic disorders of the skin: Secondary | ICD-10-CM | POA: Diagnosis not present

## 2021-04-04 DIAGNOSIS — L821 Other seborrheic keratosis: Secondary | ICD-10-CM | POA: Diagnosis not present

## 2021-04-04 DIAGNOSIS — D225 Melanocytic nevi of trunk: Secondary | ICD-10-CM | POA: Diagnosis not present

## 2021-04-04 DIAGNOSIS — L578 Other skin changes due to chronic exposure to nonionizing radiation: Secondary | ICD-10-CM | POA: Diagnosis not present

## 2021-04-04 DIAGNOSIS — L814 Other melanin hyperpigmentation: Secondary | ICD-10-CM | POA: Diagnosis not present

## 2021-04-17 DIAGNOSIS — Z20822 Contact with and (suspected) exposure to covid-19: Secondary | ICD-10-CM | POA: Diagnosis not present

## 2021-04-18 DIAGNOSIS — Z20822 Contact with and (suspected) exposure to covid-19: Secondary | ICD-10-CM | POA: Diagnosis not present

## 2021-04-20 DIAGNOSIS — M545 Low back pain, unspecified: Secondary | ICD-10-CM | POA: Diagnosis not present

## 2021-04-20 DIAGNOSIS — G3184 Mild cognitive impairment, so stated: Secondary | ICD-10-CM | POA: Diagnosis not present

## 2021-04-20 DIAGNOSIS — R252 Cramp and spasm: Secondary | ICD-10-CM | POA: Diagnosis not present

## 2021-04-20 DIAGNOSIS — G603 Idiopathic progressive neuropathy: Secondary | ICD-10-CM | POA: Diagnosis not present

## 2021-04-20 DIAGNOSIS — Z79899 Other long term (current) drug therapy: Secondary | ICD-10-CM | POA: Diagnosis not present

## 2021-05-02 DIAGNOSIS — Z79899 Other long term (current) drug therapy: Secondary | ICD-10-CM | POA: Diagnosis not present

## 2021-05-02 DIAGNOSIS — I10 Essential (primary) hypertension: Secondary | ICD-10-CM | POA: Diagnosis not present

## 2021-05-06 DIAGNOSIS — Z20822 Contact with and (suspected) exposure to covid-19: Secondary | ICD-10-CM | POA: Diagnosis not present

## 2021-05-09 ENCOUNTER — Emergency Department (HOSPITAL_BASED_OUTPATIENT_CLINIC_OR_DEPARTMENT_OTHER)
Admission: EM | Admit: 2021-05-09 | Discharge: 2021-05-09 | Disposition: A | Discharge status: Home / Self Care | Payer: Medicare Other | Attending: Emergency Medicine | Admitting: Emergency Medicine

## 2021-05-09 ENCOUNTER — Other Ambulatory Visit: Payer: Self-pay

## 2021-05-09 ENCOUNTER — Encounter (HOSPITAL_BASED_OUTPATIENT_CLINIC_OR_DEPARTMENT_OTHER): Payer: Self-pay | Admitting: Emergency Medicine

## 2021-05-09 ENCOUNTER — Emergency Department (HOSPITAL_BASED_OUTPATIENT_CLINIC_OR_DEPARTMENT_OTHER): Payer: Medicare Other | Admitting: Radiology

## 2021-05-09 DIAGNOSIS — J1282 Pneumonia due to coronavirus disease 2019: Secondary | ICD-10-CM | POA: Diagnosis not present

## 2021-05-09 DIAGNOSIS — U071 COVID-19: Secondary | ICD-10-CM | POA: Insufficient documentation

## 2021-05-09 DIAGNOSIS — Z91010 Allergy to peanuts: Secondary | ICD-10-CM | POA: Diagnosis not present

## 2021-05-09 DIAGNOSIS — Z7982 Long term (current) use of aspirin: Secondary | ICD-10-CM | POA: Diagnosis not present

## 2021-05-09 DIAGNOSIS — R918 Other nonspecific abnormal finding of lung field: Secondary | ICD-10-CM | POA: Diagnosis not present

## 2021-05-09 DIAGNOSIS — R059 Cough, unspecified: Secondary | ICD-10-CM | POA: Diagnosis not present

## 2021-05-09 DIAGNOSIS — J189 Pneumonia, unspecified organism: Secondary | ICD-10-CM

## 2021-05-09 DIAGNOSIS — R509 Fever, unspecified: Secondary | ICD-10-CM | POA: Diagnosis not present

## 2021-05-09 MED ORDER — ONDANSETRON HCL 4 MG PO TABS: 4.0000 mg | ORAL_TABLET | Freq: Three times a day (TID) | ORAL | 0 refills | Status: DC | PRN

## 2021-05-09 MED ORDER — DOXYCYCLINE HYCLATE 100 MG PO CAPS: 100.0000 mg | ORAL_CAPSULE | Freq: Two times a day (BID) | ORAL | 0 refills | Status: DC

## 2021-05-09 NOTE — ED Notes (Signed)
RT note: Pt. placed in Isolation room with v/s updated, call bell within reach. ?

## 2021-05-09 NOTE — Discharge Instructions (Addendum)
The x-ray today showed that you had a touch of pneumonia in the right lower lobe of the lung.  You were started on antibiotics and given a prescription for antinausea medication.  If you would like to start the Paxlovid you we need to start it by tomorrow.  If you start having severe shortness of breath, inability to hold down your medication due to repetitive vomiting you should return to the emergency room.  I would expect that you should start feeling better in the next 2 to 3 days and if you are starting to feel worse you either need to return to the ER or see your doctor ?

## 2021-05-09 NOTE — ED Provider Notes (Signed)
?[facility] ?Provider Note ? ? ?CSN: [id number] ?Arrival date & time: 05/09/21  1300 ? ?  ? ?History ? ?Chief Complaint  ?Patient presents with  ? Covid Positive  ? ? ?Kevin Kim is a 76 y.o. male. ? ?Patient is a 76 year old male with a history of GERD, nerve pain and depression who is presenting today with complaints of cough, fever, congestion, nausea and diarrhea that has gradually worsened since Saturday.  He reports this morning has been the worst day as he was having more nausea, some blood-tinged sputum and fever as high as 102 today.  Patient did test himself for COVID x2 on Sunday and tested positive.  He spoke with his doctor over the phone and was prescribed Paxlovid yesterday but had not started taking it because he felt too nauseated this morning.  He does report feeling a little short of breath this morning but denies any shortness of breath currently.  He reports now he is feeling little better than what he did earlier.  However he continues to have the cough and sputum production.  He reports several weeks ago he had a sore throat and saw his doctor at that time and was given azithromycin and it seemed to get better until Saturday.  He has no history of lung disease but reports he has had pneumonia before. ? ?The history is provided by the patient.  ? ?  ? ?Home Medications ?Prior to Admission medications   ?Medication Sig Start Date End Date Taking? Authorizing Provider  ?doxycycline (VIBRAMYCIN) 100 MG capsule Take 1 capsule (100 mg total) by mouth 2 (two) times daily. 05/09/21  Yes Blanchie Dessert, MD  ?ondansetron (ZOFRAN) 4 MG tablet Take 1 tablet (4 mg total) by mouth every 8 (eight) hours as needed for nausea or vomiting. 05/09/21  Yes Blanchie Dessert, MD  ?aspirin 81 MG chewable tablet Chew 81 mg by mouth every morning.    [provider]  ?atorvastatin (LIPITOR) 10 MG tablet  04/16/18   [provider]  ?Cholecalciferol 2000 UNITS CAPS  Take 1 capsule (2,000 Units total) by mouth daily. 01/20/14   Ainsley Spinner, PA-C  ?esomeprazole (NEXIUM) 40 MG capsule  04/02/18   [provider]  ?hydrOXYzine (ATARAX/VISTARIL) 50 MG tablet Take 1 tablet (50 mg total) by mouth 3 (three) times daily as needed for itching, anxiety or nausea. 10/30/13   Ainsley Spinner, PA-C  ?lansoprazole (PREVACID) 30 MG capsule Take 30 mg by mouth every morning.     [provider]  ?meloxicam (MOBIC) 15 MG tablet Take 1 tablet (15 mg total) by mouth daily. 04/02/18   Edrick Kins, DPM  ?methocarbamol (ROBAXIN) 500 MG tablet Take 1-2 tablets (500-1,000 mg total) by mouth every 6 (six) hours as needed for muscle spasms. 01/20/14   Ainsley Spinner, PA-C  ?Multiple Vitamins-Minerals (MULTIVITAMINS THER. W/MINERALS) TABS Take 1 tablet by mouth every morning. Centrum Silver    [provider]  ?oxyCODONE (OXY IR/ROXICODONE) 5 MG immediate release tablet Take 1-2 tablets (5-10 mg total) by mouth every 6 (six) hours as needed for breakthrough pain (take between percocet). 01/20/14   Ainsley Spinner, PA-C  ?oxyCODONE-acetaminophen (PERCOCET) 5-325 MG tablet Take 1 tablet by mouth every 6 (six) hours as needed for severe pain. 02/20/18   Edrick Kins, DPM  ?pregabalin (LYRICA) 100 MG capsule  04/22/18   [provider]  ?pregabalin (LYRICA) 75 MG capsule Take 1 capsule (75 mg total) by mouth 2 (two) times daily.  10/30/13   Ainsley Spinner, PA-C  ?tamsulosin (FLOMAX) 0.4 MG CAPS capsule Take 1 capsule (0.4 mg total) by mouth daily after supper. 10/30/13   Ainsley Spinner, PA-C  ?venlafaxine (EFFEXOR) 37.5 MG tablet Take 1 tablet by mouth daily. 12/14/13   [provider]  ?Vitamin D, Ergocalciferol, (DRISDOL) 50000 UNITS CAPS capsule Take 50,000 Units by mouth once a week. On Fridays 01/03/13   [provider]  ?   ? ?Allergies    ?Amoxicillin, Erythromycin, and Peanut-containing drug products   ? ?Review of Systems   ?Review of Systems ? ?Physical Exam ?Updated  Vital Signs ?BP 127/67   Pulse 92   Temp (!) 100.6 ?F (38.1 ?C) (Temporal)   Resp 18   Ht 5' 6.5" (1.689 m)   Wt 86.6 kg   SpO2 96%   BMI 30.37 kg/m?  ?Physical Exam ?Vitals and nursing note reviewed.  ?Constitutional:   ?   General: He is not in acute distress. ?   Appearance: He is well-developed.  ?HENT:  ?   Head: Normocephalic and atraumatic.  ?   Nose: Congestion present.  ?   Mouth/Throat:  ?   Mouth: Mucous membranes are moist.  ?Eyes:  ?   Conjunctiva/sclera: Conjunctivae normal.  ?   Pupils: Pupils are equal, round, and reactive to light.  ?Cardiovascular:  ?   Rate and Rhythm: Normal rate and regular rhythm.  ?   Heart sounds: No murmur heard. ?Pulmonary:  ?   Effort: Pulmonary effort is normal. No respiratory distress.  ?   Breath sounds: Examination of the right-lower field reveals rhonchi. Rhonchi present. No wheezing or rales.  ?Abdominal:  ?   General: There is no distension.  ?   Palpations: Abdomen is soft.  ?   Tenderness: There is no abdominal tenderness. There is no guarding or rebound.  ?Musculoskeletal:     ?   General: No tenderness. Normal range of motion.  ?   Cervical back: Normal range of motion and neck supple.  ?   Right lower leg: No edema.  ?   Left lower leg: No edema.  ?Skin: ?   General: Skin is warm and dry.  ?   Findings: No erythema or rash.  ?Neurological:  ?   Mental Status: He is alert and oriented to person, place, and time. Mental status is at baseline.  ?Psychiatric:     ?   Behavior: Behavior normal.  ? ? ?ED Results / Procedures / Treatments   ?Labs ?(all labs ordered are listed, but only abnormal results are displayed) ?Labs Reviewed - No data to display ? ?EKG ?None ? ?Radiology ?DG Chest 2 View ? ?Result Date: 05/09/2021 ?CLINICAL DATA:  Cough, fever, COVID positive EXAM: CHEST - 2 VIEW COMPARISON:  08/20/2017 FINDINGS: Cardiac contour the upper limit of normal. Normal mediastinal contours. Heterogeneous opacities in the right lung base. No pleural effusion or  pneumothorax. No acute osseous abnormality. IMPRESSION: Heterogeneous opacities in the right lung base, which are nonspecific but could be infectious or inflammatory. Electronically Signed   By: Merilyn Baba M.D.   On: 05/09/2021 13:39   ? ?Procedures ?Procedures  ? ? ?Medications Ordered in ED ?Medications - No data to display ? ?ED Course/ Medical Decision Making/ A&P ?  ?                        ?Medical Decision Making ?Amount and/or Complexity of Data Reviewed ?External Data Reviewed:  notes. ?Radiology: ordered and independent interpretation performed. Decision-making details documented in ED Course. ? ?Risk ?Prescription drug management. ? ? ?Pt presenting today with a complaint that caries a high risk for morbidity and mortality of fever, cough, blood-tinged sputum and shortness of breath.  Patient is COVID-positive per his test at home and was not retested here.  Upon arrival here patient's temperature was 100.6 with mild tachycardia of 104 but that improved without intervention.  Patient's heart rate is now less than 80.  He was able to ambulate with sats remaining at 95% on room air.  Patient is able to carry on full conversation without appearing winded.  He does report a productive cough with some blood-tinged sputum and on exam he has rhonchi in the right base.  I independently interpreted patient's chest x-ray which does show possible early pneumonia in the right base which is consistent with exam findings.  Radiology reported inflammation versus infectious etiology in the right base.  Patient already has a prescription for Paxlovid but reports that he is not sure he wants to take it.  Given evidence for concern for pneumonia will treat with doxycycline.  Was discussed with patient if he starts feeling worse he should return to the emergency room especially if he has significant shortness of breath or inability to keep the medication down.  Also was discussed with the patient that if he desires to take  Paxlovid he would need to start it by tomorrow which would be 5 days of symptoms or less. ? ? ? ? ? ? ? ? ?Final Clinical Impression(s) / ED Diagnoses ?Final diagnoses:  ?Community acquired pneumonia of right lowe

## 2021-05-09 NOTE — ED Triage Notes (Signed)
Pt arrives to ED with c/o COVID. Symptoms started x2 days ago. Symptoms include fever, rhinorrhea, nausea, and cough.  ?

## 2021-05-09 NOTE — ED Notes (Signed)
RT note: Pt. ambulated with pulse oximeter on room air: starting pulse-83/ending-102 ?Sat%-95/94 with no c/o, Dr. Maryan Rued made aware. ?

## 2021-05-10 ENCOUNTER — Emergency Department (HOSPITAL_COMMUNITY)
Admission: EM | Admit: 2021-05-10 | Discharge: 2021-05-10 | Disposition: A | Discharge status: Home / Self Care | Payer: Medicare Other | Attending: Emergency Medicine | Admitting: Emergency Medicine

## 2021-05-10 ENCOUNTER — Other Ambulatory Visit: Payer: Self-pay

## 2021-05-10 ENCOUNTER — Encounter (HOSPITAL_COMMUNITY): Payer: Self-pay | Admitting: Emergency Medicine

## 2021-05-10 DIAGNOSIS — Z7982 Long term (current) use of aspirin: Secondary | ICD-10-CM | POA: Diagnosis not present

## 2021-05-10 DIAGNOSIS — R110 Nausea: Secondary | ICD-10-CM | POA: Diagnosis not present

## 2021-05-10 DIAGNOSIS — U071 COVID-19: Secondary | ICD-10-CM | POA: Diagnosis not present

## 2021-05-10 DIAGNOSIS — I10 Essential (primary) hypertension: Secondary | ICD-10-CM | POA: Diagnosis not present

## 2021-05-10 DIAGNOSIS — R531 Weakness: Secondary | ICD-10-CM | POA: Diagnosis present

## 2021-05-10 HISTORY — DX: Essential (primary) hypertension: I10

## 2021-05-10 LAB — CBC WITH DIFFERENTIAL/PLATELET
Abs Immature Granulocytes: 0.29 10*3/uL — ABNORMAL HIGH (ref 0.00–0.07)
Basophils Absolute: 0 10*3/uL (ref 0.0–0.1)
Basophils Relative: 0 %
Eosinophils Absolute: 0 10*3/uL (ref 0.0–0.5)
Eosinophils Relative: 0 %
HCT: 39 % (ref 39.0–52.0)
Hemoglobin: 13 g/dL (ref 13.0–17.0)
Immature Granulocytes: 2 %
Lymphocytes Relative: 2 %
Lymphs Abs: 0.4 10*3/uL — ABNORMAL LOW (ref 0.7–4.0)
MCH: 33.6 pg (ref 26.0–34.0)
MCHC: 33.3 g/dL (ref 30.0–36.0)
MCV: 100.8 fL — ABNORMAL HIGH (ref 80.0–100.0)
Monocytes Absolute: 1 10*3/uL (ref 0.1–1.0)
Monocytes Relative: 5 %
Neutro Abs: 16.3 10*3/uL — ABNORMAL HIGH (ref 1.7–7.7)
Neutrophils Relative %: 91 %
Platelets: 152 10*3/uL (ref 150–400)
RBC: 3.87 MIL/uL — ABNORMAL LOW (ref 4.22–5.81)
RDW: 13.3 % (ref 11.5–15.5)
WBC: 18 10*3/uL — ABNORMAL HIGH (ref 4.0–10.5)
nRBC: 0 % (ref 0.0–0.2)

## 2021-05-10 LAB — BASIC METABOLIC PANEL
Anion gap: 8 (ref 5–15)
BUN: 23 mg/dL (ref 8–23)
CO2: 23 mmol/L (ref 22–32)
Calcium: 8.5 mg/dL — ABNORMAL LOW (ref 8.9–10.3)
Chloride: 105 mmol/L (ref 98–111)
Creatinine, Ser: 0.99 mg/dL (ref 0.61–1.24)
GFR, Estimated: >60 mL/min (ref >60)
Glucose, Bld: 107 mg/dL — ABNORMAL HIGH (ref 70–99)
Potassium: 3.9 mmol/L (ref 3.5–5.1)
Sodium: 136 mmol/L (ref 135–145)

## 2021-05-10 MED ORDER — ALBUTEROL SULFATE HFA 108 (90 BASE) MCG/ACT IN AERS
2.0000 | INHALATION_SPRAY | Freq: Once | RESPIRATORY_TRACT | Status: AC
  Administered 2021-05-10: 2 via RESPIRATORY_TRACT
  Filled 2021-05-10: qty 6.7

## 2021-05-10 NOTE — ED Notes (Signed)
I provided reinforced discharge education based off of discharge instructions. Pt acknowledged and understood my education. Pt had no further questions/concerns for provider/myself.  °

## 2021-05-10 NOTE — Discharge Instructions (Signed)
Drink plenty of fluids.  Use your inhaler every 4-6 hours for shortness of breath or wheezing.  Follow-up with your family doctor next week ?

## 2021-05-10 NOTE — ED Provider Notes (Signed)
?[facility] ?Provider Note ? ? ?CSN: [id number] ?Arrival date & time: 05/10/21  2108 ? ?  ? ?History ? ?Chief Complaint  ?Patient presents with  ? COVID +  ?   ?  ? ? ?Kevin Kim is a 76 y.o. male. ? ?Patient is diagnosed with COVID.  He was seen yesterday and had chest x-ray that showed questionable pneumonia and was started on doxycycline.  Today is day 5 of COVID and he was supposed to start to pack COVID no later than today but has decided not to take it.  He just believes he is dehydrated ? ?The history is provided by the patient and medical records.  ?Weakness ?Severity:  Mild ?Onset quality:  Sudden ?Timing:  Constant ?Progression:  Worsening ?Chronicity:  Recurrent ?Context: not alcohol use   ?Relieved by:  Nothing ?Worsened by:  Nothing ?Ineffective treatments:  None tried ?Associated symptoms: no abdominal pain, no chest pain, no cough, no diarrhea, no frequency, no headaches and no seizures   ? ?  ? ?Home Medications ?Prior to Admission medications   ?Medication Sig Start Date End Date Taking? Authorizing Provider  ?aspirin 81 MG chewable tablet Chew 81 mg by mouth every morning.    [provider]  ?atorvastatin (LIPITOR) 10 MG tablet  04/16/18   [provider]  ?Cholecalciferol 2000 UNITS CAPS Take 1 capsule (2,000 Units total) by mouth daily. 01/20/14   Ainsley Spinner, PA-C  ?doxycycline (VIBRAMYCIN) 100 MG capsule Take 1 capsule (100 mg total) by mouth 2 (two) times daily. 05/09/21   Blanchie Dessert, MD  ?esomeprazole (NEXIUM) 40 MG capsule  04/02/18   [provider]  ?hydrOXYzine (ATARAX/VISTARIL) 50 MG tablet Take 1 tablet (50 mg total) by mouth 3 (three) times daily as needed for itching, anxiety or nausea. 10/30/13   Ainsley Spinner, PA-C  ?lansoprazole (PREVACID) 30 MG capsule Take 30 mg by mouth every morning.     [provider]  ?meloxicam (MOBIC) 15 MG tablet Take 1 tablet (15 mg total) by mouth daily. 04/02/18   Edrick Kins, DPM  ?methocarbamol (ROBAXIN) 500 MG tablet Take 1-2 tablets (500-1,000 mg total) by mouth every 6 (six) hours as needed for muscle spasms. 01/20/14   Ainsley Spinner, PA-C  ?Multiple Vitamins-Minerals (MULTIVITAMINS THER. W/MINERALS) TABS Take 1 tablet by mouth every morning. Centrum Silver    [provider]  ?ondansetron (ZOFRAN) 4 MG tablet Take 1 tablet (4 mg total) by mouth every 8 (eight) hours as needed for nausea or vomiting. 05/09/21   Blanchie Dessert, MD  ?oxyCODONE (OXY IR/ROXICODONE) 5 MG immediate release tablet Take 1-2 tablets (5-10 mg total) by mouth every 6 (six) hours as needed for breakthrough pain (take between percocet). 01/20/14   Ainsley Spinner, PA-C  ?oxyCODONE-acetaminophen (PERCOCET) 5-325 MG tablet Take 1 tablet by mouth every 6 (six) hours as needed for severe pain. 02/20/18   Edrick Kins, DPM  ?pregabalin (LYRICA) 100 MG capsule  04/22/18   [provider]  ?pregabalin (LYRICA) 75 MG capsule Take 1 capsule (75 mg total) by mouth 2 (two) times daily. 10/30/13   Ainsley Spinner, PA-C  ?tamsulosin (FLOMAX) 0.4 MG CAPS capsule Take 1 capsule (0.4 mg total) by mouth daily after supper. 10/30/13   Ainsley Spinner, PA-C  ?venlafaxine (EFFEXOR) 37.5 MG tablet Take 1 tablet by mouth daily. 12/14/13   [provider]  ?Vitamin D, Ergocalciferol, (DRISDOL) 50000 UNITS CAPS capsule Take 50,000 Units by mouth once a week.  On Fridays 01/03/13   [provider]  ?   ? ?Allergies    ?Amoxicillin, Erythromycin, and Peanut-containing drug products   ? ?Review of Systems   ?Review of Systems  ?Constitutional:  Negative for appetite change and fatigue.  ?HENT:  Negative for congestion, ear discharge and sinus pressure.   ?Eyes:  Negative for discharge.  ?Respiratory:  Negative for cough.   ?Cardiovascular:  Negative for chest pain.  ?Gastrointestinal:  Negative for abdominal pain and diarrhea.  ?Genitourinary:  Negative for frequency and hematuria.  ?Musculoskeletal:  Negative for back  pain.  ?Skin:  Negative for rash.  ?Neurological:  Positive for weakness. Negative for seizures and headaches.  ?Psychiatric/Behavioral:  Negative for hallucinations.   ? ?Physical Exam ?Updated Vital Signs ?BP 130/73   Pulse 84   Temp 98.4 ?F (36.9 ?C)   Resp 18   SpO2 96%  ?Physical Exam ?Vitals and nursing note reviewed.  ?Constitutional:   ?   Appearance: He is well-developed.  ?HENT:  ?   Head: Normocephalic.  ?   Nose: Nose normal.  ?Eyes:  ?   General: No scleral icterus. ?   Conjunctiva/sclera: Conjunctivae normal.  ?Neck:  ?   Thyroid: No thyromegaly.  ?Cardiovascular:  ?   Rate and Rhythm: Normal rate and regular rhythm.  ?   Heart sounds: No murmur heard. ?  No friction rub. No gallop.  ?Pulmonary:  ?   Breath sounds: No stridor. No wheezing or rales.  ?Chest:  ?   Chest wall: No tenderness.  ?Abdominal:  ?   General: There is no distension.  ?   Tenderness: There is no abdominal tenderness. There is no rebound.  ?Musculoskeletal:     ?   General: Normal range of motion.  ?   Cervical back: Neck supple.  ?Lymphadenopathy:  ?   Cervical: No cervical adenopathy.  ?Skin: ?   Findings: No erythema or rash.  ?Neurological:  ?   Mental Status: He is alert and oriented to person, place, and time.  ?   Motor: No abnormal muscle tone.  ?   Coordination: Coordination normal.  ?Psychiatric:     ?   Behavior: Behavior normal.  ? ? ?ED Results / Procedures / Treatments   ?Labs ?(all labs ordered are listed, but only abnormal results are displayed) ?Labs Reviewed  ?CBC WITH DIFFERENTIAL/PLATELET  ?BASIC METABOLIC PANEL  ? ? ?EKG ?None ? ?Radiology ?DG Chest 2 View ? ?Result Date: 05/09/2021 ?CLINICAL DATA:  Cough, fever, COVID positive EXAM: CHEST - 2 VIEW COMPARISON:  08/20/2017 FINDINGS: Cardiac contour the upper limit of normal. Normal mediastinal contours. Heterogeneous opacities in the right lung base. No pleural effusion or pneumothorax. No acute osseous abnormality. IMPRESSION: Heterogeneous opacities in the  right lung base, which are nonspecific but could be infectious or inflammatory. Electronically Signed   By: Merilyn Baba M.D.   On: 05/09/2021 13:39   ? ?Procedures ?Procedures  ? ? ?Medications Ordered in ED ?Medications - No data to display ? ?ED Course/ Medical Decision Making/ A&P ?Patient with COVID and possible pneumonia.  Patient is not hypoxic or tachycardic he will get blood work done to check for dehydration and will follow-up with his PCP ?                        ?Medical Decision Making ?Amount and/or Complexity of Data Reviewed ?Labs: ordered. ? ?Risk ?Prescription drug management. ? ? ? ? ?This patient  presents to the ED for concern of shortness of breath, this involves an extensive number of treatment options, and is a complaint that carries with it a high risk of complications and morbidity.  The differential diagnosis includes COVID, PE, COPD ? ? ?Co morbidities that complicate the patient evaluation ? ?COVID, GERD ? ? ?Additional history obtained: ? ?Additional history obtained from patient ?External records from outside source obtained and reviewed including hospital record ? ? ?Lab Tests: ? ?I Ordered, and personally interpreted labs.  The pertinent results include: CBC shows mild white count elevation at a 18,000 ? ? ?Imaging Studies ordered: ? ?No x-ray ? ?Cardiac Monitoring: / EKG: ? ?The patient was maintained on a cardiac monitor.  I personally viewed and interpreted the cardiac monitored which showed an underlying rhythm of: Normal sinus rhythm ? ? ?Consultations Obtained: ?No consult ? ?Problem List / ED Course / Critical interventions / Medication management ? ?Shortness of breath and COVID ?I ordered medication including butyryl inhaler ?Reevaluation of the patient after these medicines showed that the patient improved ?I have reviewed the patients home medicines and have made adjustments as needed ? ? ?Social Determinants of Health: ? ?None ? ? ?Test / Admission - Considered: ? ?CT  chest considered ? ?Patient with COVID.  He is not hypoxic  and does not warrant admission.  He will be given albuterol inhaler to help with some shortness of breath ? ? ? ? ? ? ? ?Final Clinical Impression(

## 2021-05-10 NOTE — ED Triage Notes (Signed)
76 yo male presents to ED for covid positive result. Pt states he was diagnosed yesterday at [facility] after positive home test on Sunday.Pt states hes feeling worse today stating he felt sick and has SOB per EMS. Pt also reported temp of 102 for which he took tylenol. Pt sats are 97% on room air on truck. Pt ambulatory and Aox4.  ? ?Vitals: ?Bp 160/80 pt has hx of htn but has not taken bp meds x 2 days  ?Hr 102 ?Rr 24 ?Temp 97.0 ?Spo2 97% on RA ? ?

## 2021-05-31 ENCOUNTER — Other Ambulatory Visit: Payer: Self-pay | Admitting: Geriatric Medicine

## 2021-05-31 ENCOUNTER — Ambulatory Visit
Admission: RE | Admit: 2021-05-31 | Discharge: 2021-05-31 | Disposition: A | Discharge status: Home / Self Care | Payer: Medicare Other | Source: Ambulatory Visit | Attending: Geriatric Medicine | Admitting: Geriatric Medicine

## 2021-05-31 DIAGNOSIS — R918 Other nonspecific abnormal finding of lung field: Secondary | ICD-10-CM

## 2021-05-31 DIAGNOSIS — I10 Essential (primary) hypertension: Secondary | ICD-10-CM | POA: Diagnosis not present

## 2021-06-02 ENCOUNTER — Ambulatory Visit
Admission: RE | Admit: 2021-06-02 | Discharge: 2021-06-02 | Disposition: A | Discharge status: Home / Self Care | Payer: Medicare Other | Source: Ambulatory Visit | Attending: Geriatric Medicine | Admitting: Geriatric Medicine

## 2021-06-02 ENCOUNTER — Other Ambulatory Visit: Payer: Self-pay | Admitting: Geriatric Medicine

## 2021-06-02 DIAGNOSIS — R918 Other nonspecific abnormal finding of lung field: Secondary | ICD-10-CM

## 2021-06-02 DIAGNOSIS — J8410 Pulmonary fibrosis, unspecified: Secondary | ICD-10-CM | POA: Diagnosis not present

## 2021-06-02 DIAGNOSIS — I700 Atherosclerosis of aorta: Secondary | ICD-10-CM | POA: Diagnosis not present

## 2021-06-02 DIAGNOSIS — K449 Diaphragmatic hernia without obstruction or gangrene: Secondary | ICD-10-CM | POA: Diagnosis not present

## 2021-06-02 DIAGNOSIS — J9819 Other pulmonary collapse: Secondary | ICD-10-CM | POA: Diagnosis not present

## 2021-06-02 MED ORDER — IOPAMIDOL (ISOVUE-300) INJECTION 61%
75.0000 mL | Freq: Once | INTRAVENOUS | Status: AC | PRN
Start: 2021-06-02 — End: 2021-06-02
  Administered 2021-06-02: 75 mL via INTRAVENOUS

## 2021-06-08 DIAGNOSIS — H2513 Age-related nuclear cataract, bilateral: Secondary | ICD-10-CM | POA: Diagnosis not present

## 2021-06-09 DIAGNOSIS — K769 Liver disease, unspecified: Secondary | ICD-10-CM | POA: Diagnosis not present

## 2021-06-22 DIAGNOSIS — R1032 Left lower quadrant pain: Secondary | ICD-10-CM | POA: Diagnosis not present

## 2021-07-06 ENCOUNTER — Ambulatory Visit
Admission: RE | Admit: 2021-07-06 | Discharge: 2021-07-06 | Disposition: A | Discharge status: Home / Self Care | Payer: Medicare Other | Source: Ambulatory Visit | Attending: Geriatric Medicine | Admitting: Geriatric Medicine

## 2021-07-06 ENCOUNTER — Other Ambulatory Visit: Payer: Self-pay | Admitting: Geriatric Medicine

## 2021-07-06 DIAGNOSIS — J849 Interstitial pulmonary disease, unspecified: Secondary | ICD-10-CM | POA: Diagnosis not present

## 2021-07-06 DIAGNOSIS — K769 Liver disease, unspecified: Secondary | ICD-10-CM

## 2021-07-11 DIAGNOSIS — I10 Essential (primary) hypertension: Secondary | ICD-10-CM | POA: Diagnosis not present

## 2021-07-27 DIAGNOSIS — R202 Paresthesia of skin: Secondary | ICD-10-CM | POA: Diagnosis not present

## 2021-07-27 DIAGNOSIS — M25562 Pain in left knee: Secondary | ICD-10-CM | POA: Diagnosis not present

## 2021-07-27 DIAGNOSIS — G3184 Mild cognitive impairment, so stated: Secondary | ICD-10-CM | POA: Diagnosis not present

## 2021-07-27 DIAGNOSIS — M545 Low back pain, unspecified: Secondary | ICD-10-CM | POA: Diagnosis not present

## 2021-11-02 DIAGNOSIS — M545 Low back pain, unspecified: Secondary | ICD-10-CM | POA: Diagnosis not present

## 2021-11-02 DIAGNOSIS — G3184 Mild cognitive impairment, so stated: Secondary | ICD-10-CM | POA: Diagnosis not present

## 2021-11-02 DIAGNOSIS — M25562 Pain in left knee: Secondary | ICD-10-CM | POA: Diagnosis not present

## 2021-11-02 DIAGNOSIS — G603 Idiopathic progressive neuropathy: Secondary | ICD-10-CM | POA: Diagnosis not present

## 2021-11-17 DIAGNOSIS — K219 Gastro-esophageal reflux disease without esophagitis: Secondary | ICD-10-CM | POA: Diagnosis not present

## 2021-11-17 DIAGNOSIS — Z125 Encounter for screening for malignant neoplasm of prostate: Secondary | ICD-10-CM | POA: Diagnosis not present

## 2021-11-17 DIAGNOSIS — Z79899 Other long term (current) drug therapy: Secondary | ICD-10-CM | POA: Diagnosis not present

## 2021-11-17 DIAGNOSIS — R350 Frequency of micturition: Secondary | ICD-10-CM | POA: Diagnosis not present

## 2021-11-17 DIAGNOSIS — D692 Other nonthrombocytopenic purpura: Secondary | ICD-10-CM | POA: Diagnosis not present

## 2021-11-17 DIAGNOSIS — I10 Essential (primary) hypertension: Secondary | ICD-10-CM | POA: Diagnosis not present

## 2021-11-17 DIAGNOSIS — N401 Enlarged prostate with lower urinary tract symptoms: Secondary | ICD-10-CM | POA: Diagnosis not present

## 2021-11-17 DIAGNOSIS — F325 Major depressive disorder, single episode, in full remission: Secondary | ICD-10-CM | POA: Diagnosis not present

## 2021-11-17 DIAGNOSIS — E7800 Pure hypercholesterolemia, unspecified: Secondary | ICD-10-CM | POA: Diagnosis not present

## 2021-11-17 DIAGNOSIS — Z0000 Encounter for general adult medical examination without abnormal findings: Secondary | ICD-10-CM | POA: Diagnosis not present

## 2021-12-04 DIAGNOSIS — D649 Anemia, unspecified: Secondary | ICD-10-CM | POA: Diagnosis not present

## 2021-12-04 DIAGNOSIS — R319 Hematuria, unspecified: Secondary | ICD-10-CM | POA: Diagnosis not present

## 2022-01-03 ENCOUNTER — Emergency Department (HOSPITAL_BASED_OUTPATIENT_CLINIC_OR_DEPARTMENT_OTHER): Payer: Medicare Other | Admitting: Radiology

## 2022-01-03 ENCOUNTER — Encounter (HOSPITAL_BASED_OUTPATIENT_CLINIC_OR_DEPARTMENT_OTHER): Payer: Self-pay

## 2022-01-03 ENCOUNTER — Other Ambulatory Visit: Payer: Self-pay

## 2022-01-03 ENCOUNTER — Emergency Department (HOSPITAL_BASED_OUTPATIENT_CLINIC_OR_DEPARTMENT_OTHER)
Admission: EM | Admit: 2022-01-03 | Discharge: 2022-01-03 | Disposition: A | Discharge status: Home / Self Care | Payer: Medicare Other | Attending: Emergency Medicine | Admitting: Emergency Medicine

## 2022-01-03 DIAGNOSIS — Z91010 Allergy to peanuts: Secondary | ICD-10-CM | POA: Diagnosis not present

## 2022-01-03 DIAGNOSIS — R9431 Abnormal electrocardiogram [ECG] [EKG]: Secondary | ICD-10-CM

## 2022-01-03 DIAGNOSIS — Z7982 Long term (current) use of aspirin: Secondary | ICD-10-CM | POA: Diagnosis not present

## 2022-01-03 DIAGNOSIS — R0789 Other chest pain: Secondary | ICD-10-CM | POA: Diagnosis not present

## 2022-01-03 DIAGNOSIS — R079 Chest pain, unspecified: Secondary | ICD-10-CM | POA: Diagnosis not present

## 2022-01-03 LAB — BASIC METABOLIC PANEL
Anion gap: 9 (ref 5–15)
BUN: 13 mg/dL (ref 8–23)
CO2: 27 mmol/L (ref 22–32)
Calcium: 9.2 mg/dL (ref 8.9–10.3)
Chloride: 106 mmol/L (ref 98–111)
Creatinine, Ser: 1.1 mg/dL (ref 0.61–1.24)
GFR, Estimated: >60 mL/min (ref >60)
Glucose, Bld: 104 mg/dL — ABNORMAL HIGH (ref 70–99)
Potassium: 3.9 mmol/L (ref 3.5–5.1)
Sodium: 142 mmol/L (ref 135–145)

## 2022-01-03 LAB — CBC
HCT: 37.7 % — ABNORMAL LOW (ref 39.0–52.0)
Hemoglobin: 12.1 g/dL — ABNORMAL LOW (ref 13.0–17.0)
MCH: 30.9 pg (ref 26.0–34.0)
MCHC: 32.1 g/dL (ref 30.0–36.0)
MCV: 96.2 fL (ref 80.0–100.0)
Platelets: 183 10*3/uL (ref 150–400)
RBC: 3.92 MIL/uL — ABNORMAL LOW (ref 4.22–5.81)
RDW: 13.8 % (ref 11.5–15.5)
WBC: 7.6 10*3/uL (ref 4.0–10.5)
nRBC: 0 % (ref 0.0–0.2)

## 2022-01-03 LAB — TROPONIN I (HIGH SENSITIVITY)
Troponin I (High Sensitivity): 21 ng/L — ABNORMAL HIGH (ref <18)
Troponin I (High Sensitivity): 21 ng/L — ABNORMAL HIGH (ref <18)

## 2022-01-03 NOTE — ED Triage Notes (Signed)
Patient here POV from Home.  Endorses Intermittent CP for 3-4 Weeks ago. Left Chest. No SOB. No URI Symptoms noted. Pain is Sharp and usually only lasts seconds.   NAD Noted during Triage. A&Ox4. GCS 15. Ambulatory.

## 2022-01-03 NOTE — Discharge Instructions (Signed)

## 2022-01-03 NOTE — ED Provider Notes (Signed)
[facility] EMERGENCY DEPT Provider Note   CSN: [id number] Arrival date & time: 01/03/22  1714     History  Chief Complaint  Patient presents with   Chest Pain    Kevin Kim is a 77 y.o. male who [facility] emergency department chief complaint of chest discomfort.  Patient has been having leading, sharp, left-sided chest pain lasting seconds at a time on the left side.  It is worse when he pushes himself up from a seated position, nonexertional.  He denies unilateral leg swelling outside of his normal from a previous accident, shortness of breath or hemoptysis.  He denies fever or chills.  He had a cardiac workup 25 years ago with Dr. Mare Ferrari   Chest Pain      Home Medications Prior to Admission medications   Medication Sig Start Date End Date Taking? Authorizing Provider  aspirin 81 MG chewable tablet Chew 81 mg by mouth every morning.    [provider]  atorvastatin (LIPITOR) 10 MG tablet  04/16/18   [provider]  Cholecalciferol 2000 UNITS CAPS Take 1 capsule (2,000 Units total) by mouth daily. 01/20/14   Ainsley Spinner, PA-C  doxycycline (VIBRAMYCIN) 100 MG capsule Take 1 capsule (100 mg total) by mouth 2 (two) times daily. 05/09/21   Blanchie Dessert, MD  esomeprazole (NEXIUM) 40 MG capsule  04/02/18   [provider]  hydrOXYzine (ATARAX/VISTARIL) 50 MG tablet Take 1 tablet (50 mg total) by mouth 3 (three) times daily as needed for itching, anxiety or nausea. 10/30/13   Ainsley Spinner, PA-C  lansoprazole (PREVACID) 30 MG capsule Take 30 mg by mouth every morning.     [provider]  meloxicam (MOBIC) 15 MG tablet Take 1 tablet (15 mg total) by mouth daily. 04/02/18   Edrick Kins, DPM  methocarbamol (ROBAXIN) 500 MG tablet Take 1-2 tablets (500-1,000 mg total) by mouth every 6 (six) hours as needed for muscle spasms. 01/20/14   Ainsley Spinner, PA-C  Multiple Vitamins-Minerals (MULTIVITAMINS THER. W/MINERALS) TABS Take 1 tablet by  mouth every morning. Centrum Silver    [provider]  ondansetron (ZOFRAN) 4 MG tablet Take 1 tablet (4 mg total) by mouth every 8 (eight) hours as needed for nausea or vomiting. 05/09/21   Blanchie Dessert, MD  oxyCODONE (OXY IR/ROXICODONE) 5 MG immediate release tablet Take 1-2 tablets (5-10 mg total) by mouth every 6 (six) hours as needed for breakthrough pain (take between percocet). 01/20/14   Ainsley Spinner, PA-C  oxyCODONE-acetaminophen (PERCOCET) 5-325 MG tablet Take 1 tablet by mouth every 6 (six) hours as needed for severe pain. 02/20/18   Edrick Kins, DPM  pregabalin (LYRICA) 100 MG capsule  04/22/18   [provider]  pregabalin (LYRICA) 75 MG capsule Take 1 capsule (75 mg total) by mouth 2 (two) times daily. 10/30/13   Ainsley Spinner, PA-C  tamsulosin (FLOMAX) 0.4 MG CAPS capsule Take 1 capsule (0.4 mg total) by mouth daily after supper. 10/30/13   Ainsley Spinner, PA-C  venlafaxine (EFFEXOR) 37.5 MG tablet Take 1 tablet by mouth daily. 12/14/13   [provider]  Vitamin D, Ergocalciferol, (DRISDOL) 50000 UNITS CAPS capsule Take 50,000 Units by mouth once a week. On Fridays 01/03/13   [provider]      Allergies    Amoxicillin, Erythromycin, and Peanut-containing drug products    Review of Systems   Review of Systems  Cardiovascular:  Positive for chest pain.    Physical Exam Updated Vital Signs  BP (!) 147/79   Pulse 78   Temp 98 F (36.7 C) (Oral)   Resp 20   Ht 5' 6.5" (1.689 m)   Wt 86.6 kg   SpO2 98%   BMI 30.35 kg/m  Physical Exam Vitals and nursing note reviewed.  Constitutional:      General: He is not in acute distress.    Appearance: He is well-developed. He is not diaphoretic.  HENT:     Head: Normocephalic and atraumatic.  Eyes:     General: No scleral icterus.    Conjunctiva/sclera: Conjunctivae normal.  Cardiovascular:     Rate and Rhythm: Normal rate and regular rhythm.     Heart sounds: Normal heart sounds.   Pulmonary:     Effort: Pulmonary effort is normal. No respiratory distress.     Breath sounds: Normal breath sounds.  Abdominal:     Palpations: Abdomen is soft.     Tenderness: There is no abdominal tenderness.  Musculoskeletal:     Cervical back: Normal range of motion and neck supple.  Skin:    General: Skin is warm and dry.  Neurological:     Mental Status: He is alert.  Psychiatric:        Behavior: Behavior normal.     ED Results / Procedures / Treatments   Labs (all labs ordered are listed, but only abnormal results are displayed) Labs Reviewed  BASIC METABOLIC PANEL - Abnormal; Notable for the following components:      Result Value   Glucose, Bld 104 (*)    All other components within normal limits  CBC - Abnormal; Notable for the following components:   RBC 3.92 (*)    Hemoglobin 12.1 (*)    HCT 37.7 (*)    All other components within normal limits  TROPONIN I (HIGH SENSITIVITY) - Abnormal; Notable for the following components:   Troponin I (High Sensitivity) 21 (*)    All other components within normal limits  TROPONIN I (HIGH SENSITIVITY) - Abnormal; Notable for the following components:   Troponin I (High Sensitivity) 21 (*)    All other components within normal limits    EKG None  Radiology DG Chest 2 View  Result Date: 01/03/2022 CLINICAL DATA:  Chest pain EXAM: CHEST - 2 VIEW COMPARISON:  07/06/2021 FINDINGS: Transverse diameter of heart is increased. There are no signs of pulmonary edema or focal pulmonary consolidation. There is interval clearing of patchy infiltrate in right lower lung field. Costophrenic angles are clear. There is no pneumothorax. IMPRESSION: Cardiomegaly. There are no signs of pulmonary edema or focal pulmonary consolidation. Electronically Signed   By: Elmer Picker M.D.   On: 01/03/2022 18:07    Procedures Procedures    Medications Ordered in ED Medications - No data to display  ED Course/ Medical Decision Making/  A&P Clinical Course as of 01/03/22 2253  Wed Jan 03, 2022  2252 Troponin I (High Sensitivity)(!): 21 Troponin is 21, just above normal, flat  [AH]  3614 Basic metabolic panel(!) [AH]  4315 CBC(!) [AH]  2252 EKG 12-Lead EKG shows sinus rhythm at a rate of 78, ST and T wave abnormalities. [AH]  2253 DG Chest 2 View I visualized and interpreted two-view chest x-ray which shows no evidence of acute abnormality.  He has some cardiomegaly which has been present for some time [AH]    Clinical Course User Index [AH] Margarita Mail, PA-C  Medical Decision Making Given the large differential diagnosis for Kevin Kim, the decision making in this case is of high complexity.  After evaluating all of the data points in this case, the presentation of Kevin Kim is NOT consistent with Acute Coronary Syndrome (ACS) and/or myocardial ischemia, pulmonary embolism, aortic dissection; Borhaave's, significant arrythmia, pneumothorax, cardiac tamponade, or other emergent cardiopulmonary condition.  Further, the presentation of Kevin Kim is NOT consistent with pericarditis, myocarditis, cholecystitis, pancreatitis, mediastinitis, endocarditis, new valvular disease.  Additionally, the presentation of Kevin Kim NOT consistent with flail chest, cardiac contusion, ARDS, or significant intra-thoracic or intra-abdominal bleeding.  Moreover, this presentation is NOT consistent with pneumonia, sepsis, or pyelonephritis.    Strict return and follow-up precautions have been given by me personally or by detailed written instruction given verbally by nursing staff using the teach back method to the patient/family/caregiver(s).  Data Reviewed/Counseling: I have reviewed the patient's vital signs, nursing notes, and other relevant tests/information. I had a detailed discussion regarding the historical points, exam findings, and any diagnostic results supporting  the discharge diagnosis. I also discussed the need for outpatient follow-up and the need to return to the ED if symptoms worsen or if there are any questions or concerns that arise at home.    Amount and/or Complexity of Data Reviewed Labs: ordered. Decision-making details documented in ED Course. Radiology: ordered and independent interpretation performed. Decision-making details documented in ED Course. ECG/medicine tests: ordered and independent interpretation performed. Decision-making details documented in ED Course.   Patient here with fleeting chest pain.  This does not sound cardiac in etiology.  Although the patient's troponin is slightly elevated it is flat and unchanging.  I do not think this represents cardiac etiology or ACS.  Doubt any other emergent cause of patient's pain.  Will have him follow-up with cardiology.  He appears otherwise appropriate for discharge at time        Final Clinical Impression(s) / ED Diagnoses Final diagnoses:  Atypical chest pain  Abnormal EKG    Rx / DC Orders ED Discharge Orders     None         Margarita Mail, PA-C 01/03/22 2255    Charlesetta Shanks, MD 01/10/22 [phone]

## 2022-01-18 ENCOUNTER — Encounter (HOSPITAL_BASED_OUTPATIENT_CLINIC_OR_DEPARTMENT_OTHER): Payer: Self-pay | Admitting: Cardiology

## 2022-01-18 ENCOUNTER — Ambulatory Visit (INDEPENDENT_AMBULATORY_CARE_PROVIDER_SITE_OTHER): Payer: Medicare Other | Admitting: Cardiology

## 2022-01-18 VITALS — BP 114/60 | HR 65 | Ht 66.5 in | Wt 200.5 lb

## 2022-01-18 DIAGNOSIS — Z7189 Other specified counseling: Secondary | ICD-10-CM | POA: Diagnosis not present

## 2022-01-18 DIAGNOSIS — R9431 Abnormal electrocardiogram [ECG] [EKG]: Secondary | ICD-10-CM

## 2022-01-18 DIAGNOSIS — R072 Precordial pain: Secondary | ICD-10-CM | POA: Diagnosis not present

## 2022-01-18 DIAGNOSIS — I10 Essential (primary) hypertension: Secondary | ICD-10-CM | POA: Diagnosis not present

## 2022-01-18 NOTE — Patient Instructions (Signed)
Medication Instructions:  Your physician recommends that you continue on your current medications as directed. Please refer to the Current Medication list given to you today.   Labwork: BMET 1 TODAY   Testing/Procedures: Your physician has requested that you have cardiac CT. Cardiac computed tomography (CT) is a painless test that uses an x-ray machine to take clear, detailed pictures of your heart. For further information please visit [email or web address]. Please follow instruction sheet as given.  Follow-Up: TO BE DETERMINED    Your cardiac CT will be scheduled at one of the below locations:   [facility] [address] [place], [place] [address] ([address] [place], [place] [address] [phone]  [facility] [place], [place] [address] [phone]  If scheduled at [facility], please arrive at the [facility] and Children's Entrance (Entrance C2) of [facility] 30 minutes prior to test start time. You can use the FREE valet parking offered at entrance C (encouraged to control the heart rate for the test)  Proceed to the [facility] Radiology Department (first floor) to check-in and test prep.  All radiology patients and guests should use entrance C2 at [facility], accessed from [facility], even though the hospital's physical address listed is [address]    If scheduled at [facility] or [facility], please arrive 15 mins early for check-in and test prep.   Please follow these instructions carefully (unless otherwise directed):  Hold all erectile dysfunction medications at least 3 days (72 hrs) prior to test. (Ie viagra, cialis, sildenafil, tadalafil, etc) We will administer nitroglycerin during this exam.   On the Night Before the  Test: Be sure to Drink plenty of water. Do not consume any caffeinated/decaffeinated beverages or chocolate 12 hours prior to your test. Do not take any antihistamines 12 hours prior to your test.  On the Day of the Test: Drink plenty of water until 1 hour prior to the test. Do not eat any food 1 hour prior to test. You may take your regular medications prior to the test.  Take metoprolol (Lopressor) two hours prior to test. HOLD Furosemide/Hydrochlorothiazide morning of the test.  After the Test: Drink plenty of water. After receiving IV contrast, you may experience a mild flushed feeling. This is normal. On occasion, you may experience a mild rash up to 24 hours after the test. This is not dangerous. If this occurs, you can take Benadryl 25 mg and increase your fluid intake. If you experience trouble breathing, this can be serious. If it is severe call 911 IMMEDIATELY. If it is mild, please call our office. If you take any of these medications: Glipizide/Metformin, Avandament, Glucavance, please do not take 48 hours after completing test unless otherwise instructed.  We will call to schedule your test 2-4 weeks out understanding that some insurance companies will need an authorization prior to the service being performed.   For non-scheduling related questions, please contact the cardiac imaging nurse navigator should you have any questions/concerns: Marchia Bond, Cardiac Imaging Nurse Navigator Gordy Clement, Cardiac Imaging Nurse Navigator [facility] Direct Office Dial: [phone]   For scheduling needs, including cancellations and rescheduling, please call [place], [phone].  Cardiac CT Angiogram A cardiac CT angiogram is a procedure to look at the heart and the area around the heart. It  may be done to help find the cause of chest pains or other symptoms of heart disease. During this procedure, a substance called contrast dye is injected into the  blood vessels in the area to be checked. A large X-ray machine, called a CT scanner, then takes detailed pictures of the heart and the surrounding area. The procedure is also sometimes called a coronary CT angiogram, coronary artery scanning, or CTA. A cardiac CT angiogram allows the health care provider to see how well blood is flowing to and from the heart. The health care provider will be able to see if there are any problems, such as: Blockage or narrowing of the coronary arteries in the heart. Fluid around the heart. Signs of weakness or disease in the muscles, valves, and tissues of the heart. Tell a health care provider about: Any allergies you have. This is especially important if you have had a previous allergic reaction to contrast dye. All medicines you are taking, including vitamins, herbs, eye drops, creams, and over-the-counter medicines. Any blood disorders you have. Any surgeries you have had. Any medical conditions you have. Whether you are pregnant or may be pregnant. Any anxiety disorders, chronic pain, or other conditions you have that may increase your stress or prevent you from lying still. What are the risks? Generally, this is a safe procedure. However, problems may occur, including: Bleeding. Infection. Allergic reactions to medicines or dyes. Damage to other structures or organs. Kidney damage from the contrast dye that is used. Increased risk of cancer from radiation exposure. This risk is low. Talk with your health care provider about: The risks and benefits of testing. How you can receive the lowest dose of radiation. What happens before the procedure? Wear comfortable clothing and remove any jewelry, glasses, dentures, and hearing aids. Follow instructions from your health care provider about eating and drinking. This may include: For 12 hours before the procedure -- avoid caffeine. This includes tea, coffee, soda, energy drinks, and diet pills. Drink plenty of  water or other fluids that do not have caffeine in them. Being well hydrated can prevent complications. For 4-6 hours before the procedure -- stop eating and drinking. The contrast dye can cause nausea, but this is less likely if your stomach is empty. Ask your health care provider about changing or stopping your regular medicines. This is especially important if you are taking diabetes medicines, blood thinners, or medicines to treat problems with erections (erectile dysfunction). What happens during the procedure?  Hair on your chest may need to be removed so that small sticky patches called electrodes can be placed on your chest. These will transmit information that helps to monitor your heart during the procedure. An IV will be inserted into one of your veins. You might be given a medicine to control your heart rate during the procedure. This will help to ensure that good images are obtained. You will be asked to lie on an exam table. This table will slide in and out of the CT machine during the procedure. Contrast dye will be injected into the IV. You might feel warm, or you may get a metallic taste in your mouth. You will be given a medicine called nitroglycerin. This will relax or dilate the arteries in your heart. The table that you are lying on will move into the CT machine tunnel for the scan. The person running the machine will give you instructions while the scans are being done. You may be asked to: Keep your  arms above your head. Hold your breath. Stay very still, even if the table is moving. When the scanning is complete, you will be moved out of the machine. The IV will be removed. The procedure may vary among health care providers and hospitals. What can I expect after the procedure? After your procedure, it is common to have: A metallic taste in your mouth from the contrast dye. A feeling of warmth. A headache from the nitroglycerin. Follow these instructions at home: Take  over-the-counter and prescription medicines only as told by your health care provider. If you are told, drink enough fluid to keep your urine pale yellow. This will help to flush the contrast dye out of your body. Most people can return to their normal activities right after the procedure. Ask your health care provider what activities are safe for you. It is up to you to get the results of your procedure. Ask your health care provider, or the department that is doing the procedure, when your results will be ready. Keep all follow-up visits as told by your health care provider. This is important. Contact a health care provider if: You have any symptoms of allergy to the contrast dye. These include: Shortness of breath. Rash or hives. A racing heartbeat. Summary A cardiac CT angiogram is a procedure to look at the heart and the area around the heart. It may be done to help find the cause of chest pains or other symptoms of heart disease. During this procedure, a large X-ray machine, called a CT scanner, takes detailed pictures of the heart and the surrounding area after a contrast dye has been injected into blood vessels in the area. Ask your health care provider about changing or stopping your regular medicines before the procedure. This is especially important if you are taking diabetes medicines, blood thinners, or medicines to treat erectile dysfunction. If you are told, drink enough fluid to keep your urine pale yellow. This will help to flush the contrast dye out of your body. This information is not intended to replace advice given to you by your health care provider. Make sure you discuss any questions you have with your health care provider. Document Revised: 04/06/2021 Document Reviewed: 08/13/2018 Elsevier Patient Education  [place].

## 2022-01-18 NOTE — Progress Notes (Signed)
Cardiology Office Note:    Date:  01/18/2022   ID:  FAMOUS SPELLER, DOB 01/27/1945, MRN [id number]  PCP:  Lajean Manes, MD  Cardiologist:  Buford Dresser, MD  Referring MD: Margarita Mail, PA-C   CC:  New patient evaluation for atypical chest pain  History of Present Illness:    Kevin Kim is a 77 y.o. male with a hx of hypertension, iron deficiency anemia, GERD, neuropathy, and pneumonia, who is seen as a new consult at the request of Margarita Mail, PA-C for the evaluation and management of chest pain and abnormal EKG.  He presented to the ED 01/03/2022 with complaints of sharp, left sided chest pain lasting seconds at a time. His EKG showed NSR at 78 bpm, possible LAE, and ST-T wave abnormality, artifact. Troponins were flat, 21 x2. His symptoms were not felt to be consistent with cardiac etiology or ACS. He was discharged with outpatient cardiology follow-up.  Previously tested COVID-positive per home test and presented to the ED 05/09/21 for treatment. He had a prescription for Paxlovid but decided not to take it. Chest x-ray also showed possible early pneumonia in the right base which was consistent with exam findings so he was given doxycycline.   Today, he is accompanied by his son.  Chest pain: -Initial onset: Started around Christmas 2023, he developed episodes of stabbing pain in his chest just superior to his heart. At the time he was hanging Christmas decorations but notes that they were not heavy. -Quality: Sharp, stabbing pain. Lately it is more of a strange discomfort/sensation rather than pain. -Frequency: Initially could happen twice a day, up to 8 hours apart. Lately episodes are more frequent, happening all the time. -Duration:  2-3 seconds -Associated symptoms:  None. It just stopped him in his tracks as he was unsure what happened. -Aggravating/alleviating factors:  No change with palpation. -Prior cardiac history: Has been told previously that he  had an enlarged heart. -Prior ECG:  ED visit 01/03/22 showed NSR at 78 bpm, possible LAE, and ST-T wave abnormality, artifact. -Prior workup:  Cardiac workup with Dr. Mare Ferrari 25 years ago. Heart catheterization possibly around 2009 that was reportedly normal. About 25-30 years ago, he followed with Dr. Mare Ferrari. While walking on the treadmill, it was felt he had a blockage so he was recommended for catheterization which was negative for blockage.  -Alcohol: Previously had up to 2 glasses of beer daily. At this time he has not had any alcohol for 2 weeks. -Comorbidities: He endorses acid reflux, treated with generic Nexium. -Exercise level:  Walks 4-5 miles per day. No anginal symptoms. No shortness of breath. -Cardiac ROS: no shortness of breath, no PND, no orthopnea, no LE edema, no syncope  Additionally he complains of feeling pressure in his head. He believes this may be related to sinus issues.    Past Medical History:  Diagnosis Date   Anxiety    while in hospital   Depression    GERD (gastroesophageal reflux disease)    Hypertension    Irregular heartbeat    1980's.   Pneumonia    last time 1993    Past Surgical History:  Procedure Laterality Date   CLOSED REDUCTION TIBIA Right 10/24/2013   Procedure: CLOSED REDUCTION TIBIA/FIBULA FRACTURE;  Surgeon: Mauri Pole, MD;  Location: [place];  Service: Orthopedics;  Laterality: Right;   COLONOSCOPY     COSMETIC SURGERY Right    at [facility] 11/15/2013    left wrist to right ankle  EXTERNAL FIXATION LEG Right 10/24/2013   Procedure: EXTERNAL FIXATION RIGHT LOWER LEG;  Surgeon: Mauri Pole, MD;  Location: [place];  Service: Orthopedics;  Laterality: Right;   EXTERNAL FIXATION LEG Right 10/27/2013   Procedure:   REVISION OF  EXTERNAL FIXATOR RIGHT LOWER LEG   ORIF  RIGHT TIBIAL PILON APPLICATION OF WOUND VAC;  Surgeon: Rozanna Box, MD;  Location: [place];  Service: Orthopedics;  Laterality: Right;   HERNIA REPAIR Bilateral [id number]    plus umbilicial-    I & D EXTREMITY Right 10/24/2013   Procedure: IRRIGATION AND DEBRIDEMENT OPEN RIGHT TIBIA/FIBULA FRACTURE;  Surgeon: Mauri Pole, MD;  Location: [place];  Service: Orthopedics;  Laterality: Right;   ORIF TIBIA FRACTURE Right 01/2014   ORIF TIBIA FRACTURE Right 01/19/2014   Procedure: OPEN REDUCTION INTERNAL FIXATION (ORIF) TIBIA/FIBULA FRACTURE REPAIR OF TIBIA/FIBULA NON UNION;  Surgeon: Rozanna Box, MD;  Location: [place];  Service: Orthopedics;  Laterality: Right;    Current Medications: Current Outpatient Medications on File Prior to Visit  Medication Sig   aspirin 81 MG chewable tablet Chew 81 mg by mouth every morning.   atorvastatin (LIPITOR) 10 MG tablet    Cholecalciferol 2000 UNITS CAPS Take 1 capsule (2,000 Units total) by mouth daily.   Cyanocobalamin (VITAMIN B-12) 5000 MCG TBDP Take 1,000 mcg by mouth once a week.   ferrous sulfate 325 (65 FE) MG EC tablet Take 325 mg by mouth 3 (three) times a week.   Krill Oil 500 MG CAPS Take 1 capsule by mouth daily at 12 noon.   losartan (COZAAR) 50 MG tablet Take 50 mg by mouth daily.   Multiple Vitamins-Minerals (MULTIVITAMINS THER. W/MINERALS) TABS Take 1 tablet by mouth every morning. Centrum Silver   omeprazole (PRILOSEC) 20 MG capsule Take 20 mg by mouth daily.   pregabalin (LYRICA) 100 MG capsule    No current facility-administered medications on file prior to visit.     Allergies:   Amoxicillin, Erythromycin, and Peanut-containing drug products   Social History   Tobacco Use   Smoking status: Former   Smokeless tobacco: Never   Tobacco comments:    quit in 1970's only smoke occ.  Vaping Use   Vaping Use: Never used  Substance Use Topics   Alcohol use: Yes    Comment: daily   Drug use: No    Family History: family history includes Cancer in his father and mother.  ROS:   Please see the history of present illness.  Additional pertinent ROS: Constitutional: Negative for chills, fever,  night sweats, unintentional weight loss  HENT: Negative for ear pain and hearing loss. Positive for cephalic pressure Eyes: Negative for loss of vision and eye pain.  Respiratory: Negative for cough, sputum, wheezing.   Cardiovascular: See HPI. Gastrointestinal: Negative for abdominal pain, melena, and hematochezia.  Genitourinary: Negative for dysuria and hematuria.  Musculoskeletal: Negative for falls and myalgias.  Skin: Negative for itching and rash.  Neurological: Negative for focal weakness, focal sensory changes and loss of consciousness.  Endo/Heme/Allergies: Does not bruise/bleed easily.     EKGs/Labs/Other Studies Reviewed:    The following studies were reviewed today:  CT Chest  06/02/2021: IMPRESSION: 1. Patchy right lower lobe collapse/consolidation and ground-glass, most compatible with resolving pneumonia. Followup PA and lateral chest X-ray is recommended in 3-4 weeks following trial of antibiotic therapy to ensure resolution and exclude underlying malignancy. 2. No evidence of malignancy. 3. Slight irregularity of the liver margin, raises suspicion  for cirrhosis. 4. Aortic atherosclerosis (ICD10-I70.0). Coronary artery calcification.  Echo  09/15/2018:  1. The left ventricle has normal systolic function with an ejection  fraction of 60-65%. The cavity size was normal. There is moderate  concentric left ventricular hypertrophy. Left ventricular diastolic  Doppler parameters are indeterminate. No evidence  of left ventricular regional wall motion abnormalities.   2. LV apex walls thickened in a pattern suggestive of apical HCM. No  significant LV gradient seen on this study.   3. The right ventricle has normal systolic function. The cavity was  normal. There is no increase in right ventricular wall thickness. Right  ventricular systolic pressure could not be assessed.   4. Left atrial size was severely dilated.   5. Right atrial size was mildly dilated.   6.  There is mild mitral annular calcification present. No evidence of  mitral valve stenosis.   7. The aortic valve is tricuspid. No stenosis of the aortic valve.   8. The aorta is normal unless otherwise noted.   9. The aortic root and ascending aorta are normal in size and structure.   EKG:  EKG is personally reviewed.   01/18/2022:  SR at 65 bpm, PAC, nonspecific st pattern  Recent Labs: 01/03/2022: BUN 13; Creatinine, Ser 1.10; Hemoglobin 12.1; Platelets 183; Potassium 3.9; Sodium 142   Recent Lipid Panel No results found for: "CHOL", "TRIG", "HDL", "CHOLHDL", "VLDL", "LDLCALC", "LDLDIRECT"  Physical Exam:    VS:  BP 114/60 (BP Location: Right Arm, Patient Position: Sitting, Cuff Size: Large)   Pulse 65   Ht 5' 6.5" (1.689 m)   Wt 200 lb 8 oz (90.9 kg)   BMI 31.88 kg/m     Wt Readings from Last 3 Encounters:  01/18/22 200 lb 8 oz (90.9 kg)  01/03/22 190 lb 14.7 oz (86.6 kg)  05/09/21 191 lb (86.6 kg)    GEN: Well nourished, well developed in no acute distress HEENT: Normal, moist mucous membranes NECK: No JVD CARDIAC: regular rhythm, normal S1 and S2, no rubs or gallops. No murmur. VASCULAR: Radial and DP pulses 2+ bilaterally. No carotid bruits RESPIRATORY:  Clear to auscultation without rales, wheezing or rhonchi  ABDOMEN: Soft, non-tender, non-distended MUSCULOSKELETAL:  Ambulates independently SKIN: Warm and dry, no edema NEUROLOGIC:  Alert and oriented x 3. No focal neuro deficits noted. PSYCHIATRIC:  Normal affect    ASSESSMENT:    1. Precordial chest pain   2. Essential hypertension   3. Abnormal ECG   4. Cardiac risk counseling   5. Counseling on health promotion and disease prevention    PLAN:    Chest pain Abnormal ECG -discussed treadmill stress, nuclear stress/lexiscan, and CT coronary angiography. Discussed pros and cons of each, including but not limited to false positive/false negative risk, radiation risk, and risk of IV contrast dye. Based on shared  decision making, decision was made to pursue CT coronary angiography. -will give one time dose of metoprolol 2 hours prior to scheduled test -counseled on need to get BMET prior to test -counseled on use of sublingual nitroglycerin and its importance to a good test -reviewed red flag warning signs that need immediate medical attention  -already on aspirin and statin, continue  Hypertension -at goal, continue losartan  Cardiac risk counseling and prevention recommendations: -recommend heart healthy/Mediterranean diet, with whole grains, fruits, vegetable, fish, lean meats, nuts, and olive oil. Limit salt. -recommend moderate walking, 3-5 times/week for 30-50 minutes each session. Aim for at least 150 minutes.week. Goal should  be pace of 3 miles/hours, or walking 1.5 miles in 30 minutes -recommend avoidance of tobacco products. Avoid excess alcohol. -ASCVD risk score: The ASCVD Risk score (Arnett DK, et al., 2019) failed to calculate for the following reasons:   Cannot find a previous HDL lab   Cannot find a previous total cholesterol lab    Plan for follow up: TBD based on results of testing  Buford Dresser, MD, PhD, [facility]    Medication Adjustments/Labs and Tests Ordered: Current medicines are reviewed at length with the patient today.  Concerns regarding medicines are outlined above.   Orders Placed This Encounter  Procedures   CT CORONARY MORPH W/CTA COR W/SCORE W/CA W/CM &/OR WO/CM   Basic metabolic panel   EKG [id number]   No orders of the defined types were placed in this encounter.  Patient Instructions  Medication Instructions:  Your physician recommends that you continue on your current medications as directed. Please refer to the Current Medication list given to you today.   Labwork: BMET 1 TODAY   Testing/Procedures: Your physician has requested that you have cardiac CT. Cardiac computed tomography (CT) is a painless test that uses  an x-ray machine to take clear, detailed pictures of your heart. For further information please visit [email or web address]. Please follow instruction sheet as given.  Follow-Up: TO BE DETERMINED    Your cardiac CT will be scheduled at one of the below locations:   [facility] [address] [place], [place] [address] [phone]) [address] [place], [place] [address] [phone]  [facility] [place], [place] [address] [phone]  If scheduled at [facility], please arrive at the [facility] and Children's Entrance (Entrance C2) of [facility] 30 minutes prior to test start time. You can use the FREE valet parking offered at entrance C (encouraged to control the heart rate for the test)  Proceed to the [facility] Radiology Department (first floor) to check-in and test prep.  All radiology patients and guests should use entrance C2 at [facility], accessed from [facility], even though the hospital's physical address listed is [address]    If scheduled at [facility] or [facility], please arrive 15 mins early for check-in and test prep.   Please follow these instructions carefully (unless otherwise directed):  Hold all erectile dysfunction medications at least 3 days (72 hrs) prior to test. (Ie viagra, cialis, sildenafil, tadalafil, etc) We will administer nitroglycerin during this exam.   On the Night Before the Test: Be sure to Drink plenty of water. Do not consume any caffeinated/decaffeinated beverages or chocolate 12 hours prior to your test. Do not take any antihistamines 12 hours prior to your test.  On the Day of the Test: Drink plenty of water until 1 hour prior to the test. Do not eat any food 1 hour prior to test. You may take your regular  medications prior to the test.  Take metoprolol (Lopressor) two hours prior to test. HOLD Furosemide/Hydrochlorothiazide morning of the test.  After the Test: Drink plenty of water. After receiving IV contrast, you may experience a mild flushed feeling. This is normal. On occasion, you may experience a mild rash up to 24 hours after the test. This is not dangerous. If this occurs, you can take Benadryl 25 mg and  increase your fluid intake. If you experience trouble breathing, this can be serious. If it is severe call 911 IMMEDIATELY. If it is mild, please call our office. If you take any of these medications: Glipizide/Metformin, Avandament, Glucavance, please do not take 48 hours after completing test unless otherwise instructed.  We will call to schedule your test 2-4 weeks out understanding that some insurance companies will need an authorization prior to the service being performed.   For non-scheduling related questions, please contact the cardiac imaging nurse navigator should you have any questions/concerns: Marchia Bond, Cardiac Imaging Nurse Navigator Gordy Clement, Cardiac Imaging Nurse Navigator [facility] Direct Office Dial: [phone]   For scheduling needs, including cancellations and rescheduling, please call [place], [phone].  Cardiac CT Angiogram A cardiac CT angiogram is a procedure to look at the heart and the area around the heart. It may be done to help find the cause of chest pains or other symptoms of heart disease. During this procedure, a substance called contrast dye is injected into the blood vessels in the area to be checked. A large X-ray machine, called a CT scanner, then takes detailed pictures of the heart and the surrounding area. The procedure is also sometimes called a coronary CT angiogram, coronary artery scanning, or CTA. A cardiac CT angiogram allows the health care provider to see how well blood is flowing to and from  the heart. The health care provider will be able to see if there are any problems, such as: Blockage or narrowing of the coronary arteries in the heart. Fluid around the heart. Signs of weakness or disease in the muscles, valves, and tissues of the heart. Tell a health care provider about: Any allergies you have. This is especially important if you have had a previous allergic reaction to contrast dye. All medicines you are taking, including vitamins, herbs, eye drops, creams, and over-the-counter medicines. Any blood disorders you have. Any surgeries you have had. Any medical conditions you have. Whether you are pregnant or may be pregnant. Any anxiety disorders, chronic pain, or other conditions you have that may increase your stress or prevent you from lying still. What are the risks? Generally, this is a safe procedure. However, problems may occur, including: Bleeding. Infection. Allergic reactions to medicines or dyes. Damage to other structures or organs. Kidney damage from the contrast dye that is used. Increased risk of cancer from radiation exposure. This risk is low. Talk with your health care provider about: The risks and benefits of testing. How you can receive the lowest dose of radiation. What happens before the procedure? Wear comfortable clothing and remove any jewelry, glasses, dentures, and hearing aids. Follow instructions from your health care provider about eating and drinking. This may include: For 12 hours before the procedure -- avoid caffeine. This includes tea, coffee, soda, energy drinks, and diet pills. Drink plenty of water or other fluids that do not have caffeine in them. Being well hydrated can prevent complications. For 4-6 hours before the procedure -- stop eating and drinking. The contrast dye can cause nausea, but this is less likely if your stomach is empty. Ask your health care provider about changing or stopping your regular medicines. This is  especially important if you are taking diabetes medicines, blood thinners, or medicines to treat problems with erections (erectile dysfunction). What happens during the procedure?  Hair on your chest may need to be removed so that small sticky patches called electrodes can be placed on  your chest. These will transmit information that helps to monitor your heart during the procedure. An IV will be inserted into one of your veins. You might be given a medicine to control your heart rate during the procedure. This will help to ensure that good images are obtained. You will be asked to lie on an exam table. This table will slide in and out of the CT machine during the procedure. Contrast dye will be injected into the IV. You might feel warm, or you may get a metallic taste in your mouth. You will be given a medicine called nitroglycerin. This will relax or dilate the arteries in your heart. The table that you are lying on will move into the CT machine tunnel for the scan. The person running the machine will give you instructions while the scans are being done. You may be asked to: Keep your arms above your head. Hold your breath. Stay very still, even if the table is moving. When the scanning is complete, you will be moved out of the machine. The IV will be removed. The procedure may vary among health care providers and hospitals. What can I expect after the procedure? After your procedure, it is common to have: A metallic taste in your mouth from the contrast dye. A feeling of warmth. A headache from the nitroglycerin. Follow these instructions at home: Take over-the-counter and prescription medicines only as told by your health care provider. If you are told, drink enough fluid to keep your urine pale yellow. This will help to flush the contrast dye out of your body. Most people can return to their normal activities right after the procedure. Ask your health care provider what activities are  safe for you. It is up to you to get the results of your procedure. Ask your health care provider, or the department that is doing the procedure, when your results will be ready. Keep all follow-up visits as told by your health care provider. This is important. Contact a health care provider if: You have any symptoms of allergy to the contrast dye. These include: Shortness of breath. Rash or hives. A racing heartbeat. Summary A cardiac CT angiogram is a procedure to look at the heart and the area around the heart. It may be done to help find the cause of chest pains or other symptoms of heart disease. During this procedure, a large X-ray machine, called a CT scanner, takes detailed pictures of the heart and the surrounding area after a contrast dye has been injected into blood vessels in the area. Ask your health care provider about changing or stopping your regular medicines before the procedure. This is especially important if you are taking diabetes medicines, blood thinners, or medicines to treat erectile dysfunction. If you are told, drink enough fluid to keep your urine pale yellow. This will help to flush the contrast dye out of your body. This information is not intended to replace advice given to you by your health care provider. Make sure you discuss any questions you have with your health care provider. Document Revised: 04/06/2021 Document Reviewed: 08/13/2018 Elsevier Patient Education  [place].     I,Mathew Stumpf,acting as a Education administrator for [employer], MD.,have documented all relevant documentation on the behalf of Buford Dresser, MD,as directed by  Buford Dresser, MD while in the presence of Buford Dresser, MD.  I, Buford Dresser, MD, have reviewed all documentation for this visit. The documentation on 02/19/22 for the exam, diagnosis, procedures, and  orders are all accurate and complete.   Signed, Buford Dresser, MD  PhD 01/18/2022     [place]

## 2022-01-19 DIAGNOSIS — D649 Anemia, unspecified: Secondary | ICD-10-CM | POA: Diagnosis not present

## 2022-01-19 LAB — BASIC METABOLIC PANEL
BUN/Creatinine Ratio: 18 (ref 10–24)
BUN: 14 mg/dL (ref 8–27)
CO2: 25 mmol/L (ref 20–29)
Calcium: 9.4 mg/dL (ref 8.6–10.2)
Chloride: 105 mmol/L (ref 96–106)
Creatinine, Ser: 0.79 mg/dL (ref 0.76–1.27)
Glucose: 92 mg/dL (ref 70–99)
Potassium: 4.4 mmol/L (ref 3.5–5.2)
Sodium: 142 mmol/L (ref 134–144)
eGFR: 93 mL/min/{1.73_m2} (ref >59)

## 2022-01-24 DIAGNOSIS — D509 Iron deficiency anemia, unspecified: Secondary | ICD-10-CM | POA: Diagnosis not present

## 2022-02-08 DIAGNOSIS — M5459 Other low back pain: Secondary | ICD-10-CM | POA: Diagnosis not present

## 2022-02-08 DIAGNOSIS — M5417 Radiculopathy, lumbosacral region: Secondary | ICD-10-CM | POA: Diagnosis not present

## 2022-02-08 DIAGNOSIS — G3184 Mild cognitive impairment, so stated: Secondary | ICD-10-CM | POA: Diagnosis not present

## 2022-02-08 DIAGNOSIS — Z760 Encounter for issue of repeat prescription: Secondary | ICD-10-CM | POA: Diagnosis not present

## 2022-02-08 DIAGNOSIS — G603 Idiopathic progressive neuropathy: Secondary | ICD-10-CM | POA: Diagnosis not present

## 2022-02-13 ENCOUNTER — Telehealth (HOSPITAL_COMMUNITY): Payer: Self-pay | Admitting: Emergency Medicine

## 2022-02-13 NOTE — Telephone Encounter (Signed)
Reaching out to patient to offer assistance regarding upcoming cardiac imaging study; pt verbalizes understanding of appt date/time, parking situation and where to check in, pre-test NPO status and medications ordered, and verified current allergies; name and call back number provided for further questions should they arise Kevin Bond RN Navigator Cardiac Imaging [facility] [phone] office [phone] cell  Arrival [address]  Daily meds  Denies iv issues Aware contrast/nitro

## 2022-02-15 ENCOUNTER — Ambulatory Visit (HOSPITAL_COMMUNITY)
Admission: RE | Admit: 2022-02-15 | Discharge: 2022-02-15 | Disposition: A | Discharge status: Home / Self Care | Payer: Medicare Other | Source: Ambulatory Visit | Attending: Cardiology | Admitting: Cardiology

## 2022-02-15 DIAGNOSIS — R072 Precordial pain: Secondary | ICD-10-CM

## 2022-02-15 MED ORDER — IOHEXOL 350 MG/ML SOLN
100.0000 mL | Freq: Once | INTRAVENOUS | Status: AC | PRN
  Administered 2022-02-15: 100 mL via INTRAVENOUS

## 2022-02-15 MED ORDER — NITROGLYCERIN 0.4 MG SL SUBL
SUBLINGUAL_TABLET | SUBLINGUAL | Status: AC
  Filled 2022-02-15: qty 2

## 2022-02-15 MED ORDER — NITROGLYCERIN 0.4 MG SL SUBL
0.8000 mg | SUBLINGUAL_TABLET | Freq: Once | SUBLINGUAL | Status: AC
  Administered 2022-02-15: 0.8 mg via SUBLINGUAL

## 2022-02-19 ENCOUNTER — Encounter (HOSPITAL_BASED_OUTPATIENT_CLINIC_OR_DEPARTMENT_OTHER): Payer: Self-pay | Admitting: Cardiology

## 2022-04-12 DIAGNOSIS — D692 Other nonthrombocytopenic purpura: Secondary | ICD-10-CM | POA: Diagnosis not present

## 2022-04-12 DIAGNOSIS — L570 Actinic keratosis: Secondary | ICD-10-CM | POA: Diagnosis not present

## 2022-04-12 DIAGNOSIS — L821 Other seborrheic keratosis: Secondary | ICD-10-CM | POA: Diagnosis not present

## 2022-04-12 DIAGNOSIS — L578 Other skin changes due to chronic exposure to nonionizing radiation: Secondary | ICD-10-CM | POA: Diagnosis not present

## 2022-04-12 DIAGNOSIS — L814 Other melanin hyperpigmentation: Secondary | ICD-10-CM | POA: Diagnosis not present

## 2022-04-12 DIAGNOSIS — D225 Melanocytic nevi of trunk: Secondary | ICD-10-CM | POA: Diagnosis not present

## 2022-04-12 DIAGNOSIS — D2271 Melanocytic nevi of right lower limb, including hip: Secondary | ICD-10-CM | POA: Diagnosis not present

## 2022-04-23 DIAGNOSIS — D509 Iron deficiency anemia, unspecified: Secondary | ICD-10-CM | POA: Diagnosis not present

## 2022-05-17 DIAGNOSIS — G603 Idiopathic progressive neuropathy: Secondary | ICD-10-CM | POA: Diagnosis not present

## 2022-05-17 DIAGNOSIS — M7918 Myalgia, other site: Secondary | ICD-10-CM | POA: Diagnosis not present

## 2022-05-17 DIAGNOSIS — Z79899 Other long term (current) drug therapy: Secondary | ICD-10-CM | POA: Diagnosis not present

## 2022-05-17 DIAGNOSIS — M5417 Radiculopathy, lumbosacral region: Secondary | ICD-10-CM | POA: Diagnosis not present

## 2022-05-17 DIAGNOSIS — Z760 Encounter for issue of repeat prescription: Secondary | ICD-10-CM | POA: Diagnosis not present

## 2022-05-17 DIAGNOSIS — G3184 Mild cognitive impairment, so stated: Secondary | ICD-10-CM | POA: Diagnosis not present

## 2022-08-16 DIAGNOSIS — G3184 Mild cognitive impairment, so stated: Secondary | ICD-10-CM | POA: Diagnosis not present

## 2022-08-16 DIAGNOSIS — M7918 Myalgia, other site: Secondary | ICD-10-CM | POA: Diagnosis not present

## 2022-08-16 DIAGNOSIS — Z760 Encounter for issue of repeat prescription: Secondary | ICD-10-CM | POA: Diagnosis not present

## 2022-08-16 DIAGNOSIS — Z79899 Other long term (current) drug therapy: Secondary | ICD-10-CM | POA: Diagnosis not present

## 2022-09-27 DIAGNOSIS — G603 Idiopathic progressive neuropathy: Secondary | ICD-10-CM | POA: Diagnosis not present

## 2022-09-27 DIAGNOSIS — M5417 Radiculopathy, lumbosacral region: Secondary | ICD-10-CM | POA: Diagnosis not present

## 2022-10-02 ENCOUNTER — Other Ambulatory Visit: Payer: Self-pay | Admitting: Specialist

## 2022-10-02 DIAGNOSIS — M5417 Radiculopathy, lumbosacral region: Secondary | ICD-10-CM

## 2022-10-03 DIAGNOSIS — M5459 Other low back pain: Secondary | ICD-10-CM | POA: Diagnosis not present

## 2022-10-03 DIAGNOSIS — M2569 Stiffness of other specified joint, not elsewhere classified: Secondary | ICD-10-CM | POA: Diagnosis not present

## 2022-10-05 DIAGNOSIS — M2569 Stiffness of other specified joint, not elsewhere classified: Secondary | ICD-10-CM | POA: Diagnosis not present

## 2022-10-05 DIAGNOSIS — M5459 Other low back pain: Secondary | ICD-10-CM | POA: Diagnosis not present

## 2022-10-09 DIAGNOSIS — M2569 Stiffness of other specified joint, not elsewhere classified: Secondary | ICD-10-CM | POA: Diagnosis not present

## 2022-10-09 DIAGNOSIS — M5459 Other low back pain: Secondary | ICD-10-CM | POA: Diagnosis not present

## 2022-10-12 DIAGNOSIS — M2569 Stiffness of other specified joint, not elsewhere classified: Secondary | ICD-10-CM | POA: Diagnosis not present

## 2022-10-12 DIAGNOSIS — M5459 Other low back pain: Secondary | ICD-10-CM | POA: Diagnosis not present

## 2022-10-16 DIAGNOSIS — Z860100 Personal history of colon polyps, unspecified: Secondary | ICD-10-CM | POA: Diagnosis not present

## 2022-10-16 DIAGNOSIS — M2569 Stiffness of other specified joint, not elsewhere classified: Secondary | ICD-10-CM | POA: Diagnosis not present

## 2022-10-16 DIAGNOSIS — D509 Iron deficiency anemia, unspecified: Secondary | ICD-10-CM | POA: Diagnosis not present

## 2022-10-16 DIAGNOSIS — M5459 Other low back pain: Secondary | ICD-10-CM | POA: Diagnosis not present

## 2022-10-19 ENCOUNTER — Encounter: Payer: Self-pay | Admitting: Specialist

## 2022-10-22 ENCOUNTER — Encounter: Payer: Self-pay | Admitting: Specialist

## 2022-10-23 ENCOUNTER — Encounter: Payer: Self-pay | Admitting: Specialist

## 2022-10-23 DIAGNOSIS — M5459 Other low back pain: Secondary | ICD-10-CM | POA: Diagnosis not present

## 2022-10-23 DIAGNOSIS — M2569 Stiffness of other specified joint, not elsewhere classified: Secondary | ICD-10-CM | POA: Diagnosis not present

## 2022-10-26 DIAGNOSIS — M5459 Other low back pain: Secondary | ICD-10-CM | POA: Diagnosis not present

## 2022-10-26 DIAGNOSIS — M2569 Stiffness of other specified joint, not elsewhere classified: Secondary | ICD-10-CM | POA: Diagnosis not present

## 2022-10-30 ENCOUNTER — Ambulatory Visit
Admission: RE | Admit: 2022-10-30 | Discharge: 2022-10-30 | Disposition: A | Discharge status: Home / Self Care | Payer: Medicare Other | Source: Ambulatory Visit | Attending: Specialist | Admitting: Specialist

## 2022-10-30 DIAGNOSIS — M2569 Stiffness of other specified joint, not elsewhere classified: Secondary | ICD-10-CM | POA: Diagnosis not present

## 2022-10-30 DIAGNOSIS — M48061 Spinal stenosis, lumbar region without neurogenic claudication: Secondary | ICD-10-CM | POA: Diagnosis not present

## 2022-10-30 DIAGNOSIS — M5417 Radiculopathy, lumbosacral region: Secondary | ICD-10-CM

## 2022-10-30 DIAGNOSIS — M5459 Other low back pain: Secondary | ICD-10-CM | POA: Diagnosis not present

## 2022-11-01 DIAGNOSIS — R351 Nocturia: Secondary | ICD-10-CM | POA: Diagnosis not present

## 2022-11-01 DIAGNOSIS — R339 Retention of urine, unspecified: Secondary | ICD-10-CM | POA: Diagnosis not present

## 2022-11-01 DIAGNOSIS — G603 Idiopathic progressive neuropathy: Secondary | ICD-10-CM | POA: Diagnosis not present

## 2022-11-01 DIAGNOSIS — G3184 Mild cognitive impairment, so stated: Secondary | ICD-10-CM | POA: Diagnosis not present

## 2022-11-01 DIAGNOSIS — R350 Frequency of micturition: Secondary | ICD-10-CM | POA: Diagnosis not present

## 2022-11-01 DIAGNOSIS — M5417 Radiculopathy, lumbosacral region: Secondary | ICD-10-CM | POA: Diagnosis not present

## 2022-11-14 DIAGNOSIS — K5730 Diverticulosis of large intestine without perforation or abscess without bleeding: Secondary | ICD-10-CM | POA: Diagnosis not present

## 2022-11-14 DIAGNOSIS — Z09 Encounter for follow-up examination after completed treatment for conditions other than malignant neoplasm: Secondary | ICD-10-CM | POA: Diagnosis not present

## 2022-11-14 DIAGNOSIS — Z860101 Personal history of adenomatous and serrated colon polyps: Secondary | ICD-10-CM | POA: Diagnosis not present

## 2022-11-14 DIAGNOSIS — K648 Other hemorrhoids: Secondary | ICD-10-CM | POA: Diagnosis not present

## 2022-11-14 DIAGNOSIS — D122 Benign neoplasm of ascending colon: Secondary | ICD-10-CM | POA: Diagnosis not present

## 2022-11-16 DIAGNOSIS — D122 Benign neoplasm of ascending colon: Secondary | ICD-10-CM | POA: Diagnosis not present

## 2022-11-21 DIAGNOSIS — Z0000 Encounter for general adult medical examination without abnormal findings: Secondary | ICD-10-CM | POA: Diagnosis not present

## 2022-11-21 DIAGNOSIS — Z1331 Encounter for screening for depression: Secondary | ICD-10-CM | POA: Diagnosis not present

## 2022-11-21 DIAGNOSIS — E7800 Pure hypercholesterolemia, unspecified: Secondary | ICD-10-CM | POA: Diagnosis not present

## 2022-11-21 DIAGNOSIS — K219 Gastro-esophageal reflux disease without esophagitis: Secondary | ICD-10-CM | POA: Diagnosis not present

## 2022-11-21 DIAGNOSIS — Z125 Encounter for screening for malignant neoplasm of prostate: Secondary | ICD-10-CM | POA: Diagnosis not present

## 2022-11-21 DIAGNOSIS — D692 Other nonthrombocytopenic purpura: Secondary | ICD-10-CM | POA: Diagnosis not present

## 2022-11-21 DIAGNOSIS — Z79899 Other long term (current) drug therapy: Secondary | ICD-10-CM | POA: Diagnosis not present

## 2022-11-21 DIAGNOSIS — R350 Frequency of micturition: Secondary | ICD-10-CM | POA: Diagnosis not present

## 2022-11-21 DIAGNOSIS — N401 Enlarged prostate with lower urinary tract symptoms: Secondary | ICD-10-CM | POA: Diagnosis not present

## 2022-11-21 DIAGNOSIS — D7589 Other specified diseases of blood and blood-forming organs: Secondary | ICD-10-CM | POA: Diagnosis not present

## 2022-11-21 DIAGNOSIS — R1030 Lower abdominal pain, unspecified: Secondary | ICD-10-CM | POA: Diagnosis not present

## 2022-11-21 DIAGNOSIS — D696 Thrombocytopenia, unspecified: Secondary | ICD-10-CM | POA: Diagnosis not present

## 2022-11-21 DIAGNOSIS — I10 Essential (primary) hypertension: Secondary | ICD-10-CM | POA: Diagnosis not present

## 2022-11-22 DIAGNOSIS — G3184 Mild cognitive impairment, so stated: Secondary | ICD-10-CM | POA: Diagnosis not present

## 2022-11-22 DIAGNOSIS — G603 Idiopathic progressive neuropathy: Secondary | ICD-10-CM | POA: Diagnosis not present

## 2022-11-22 DIAGNOSIS — M5417 Radiculopathy, lumbosacral region: Secondary | ICD-10-CM | POA: Diagnosis not present

## 2022-11-28 ENCOUNTER — Emergency Department (HOSPITAL_BASED_OUTPATIENT_CLINIC_OR_DEPARTMENT_OTHER): Payer: Medicare Other

## 2022-11-28 ENCOUNTER — Emergency Department (HOSPITAL_BASED_OUTPATIENT_CLINIC_OR_DEPARTMENT_OTHER)
Admission: EM | Admit: 2022-11-28 | Discharge: 2022-11-28 | Disposition: A | Discharge status: Home / Self Care | Payer: Medicare Other | Attending: Emergency Medicine | Admitting: Emergency Medicine

## 2022-11-28 ENCOUNTER — Other Ambulatory Visit: Payer: Self-pay

## 2022-11-28 ENCOUNTER — Encounter (HOSPITAL_BASED_OUTPATIENT_CLINIC_OR_DEPARTMENT_OTHER): Payer: Self-pay | Admitting: Emergency Medicine

## 2022-11-28 DIAGNOSIS — I10 Essential (primary) hypertension: Secondary | ICD-10-CM | POA: Insufficient documentation

## 2022-11-28 DIAGNOSIS — Z79899 Other long term (current) drug therapy: Secondary | ICD-10-CM | POA: Diagnosis not present

## 2022-11-28 DIAGNOSIS — N492 Inflammatory disorders of scrotum: Secondary | ICD-10-CM | POA: Diagnosis not present

## 2022-11-28 DIAGNOSIS — Z91010 Allergy to peanuts: Secondary | ICD-10-CM | POA: Insufficient documentation

## 2022-11-28 DIAGNOSIS — N433 Hydrocele, unspecified: Secondary | ICD-10-CM | POA: Insufficient documentation

## 2022-11-28 DIAGNOSIS — Z7982 Long term (current) use of aspirin: Secondary | ICD-10-CM | POA: Diagnosis not present

## 2022-11-28 DIAGNOSIS — I861 Scrotal varices: Secondary | ICD-10-CM | POA: Diagnosis not present

## 2022-11-28 DIAGNOSIS — N5089 Other specified disorders of the male genital organs: Secondary | ICD-10-CM

## 2022-11-28 HISTORY — DX: Benign prostatic hyperplasia without lower urinary tract symptoms: N40.0

## 2022-11-28 LAB — URINALYSIS, ROUTINE W REFLEX MICROSCOPIC
Bilirubin Urine: NEGATIVE
Glucose, UA: NEGATIVE mg/dL
Hgb urine dipstick: NEGATIVE
Ketones, ur: NEGATIVE mg/dL
Leukocytes,Ua: NEGATIVE
Nitrite: NEGATIVE
Protein, ur: NEGATIVE mg/dL
Specific Gravity, Urine: 1.009 (ref 1.005–1.030)
pH: 6.5 (ref 5.0–8.0)

## 2022-11-28 LAB — CBC WITH DIFFERENTIAL/PLATELET
Abs Immature Granulocytes: 0.07 10*3/uL (ref 0.00–0.07)
Basophils Absolute: 0.1 10*3/uL (ref 0.0–0.1)
Basophils Relative: 1 %
Eosinophils Absolute: 0.4 10*3/uL (ref 0.0–0.5)
Eosinophils Relative: 4 %
HCT: 37.9 % — ABNORMAL LOW (ref 39.0–52.0)
Hemoglobin: 13 g/dL (ref 13.0–17.0)
Immature Granulocytes: 1 %
Lymphocytes Relative: 24 %
Lymphs Abs: 2.1 10*3/uL (ref 0.7–4.0)
MCH: 33.6 pg (ref 26.0–34.0)
MCHC: 34.3 g/dL (ref 30.0–36.0)
MCV: 97.9 fL (ref 80.0–100.0)
Monocytes Absolute: 1 10*3/uL (ref 0.1–1.0)
Monocytes Relative: 12 %
Neutro Abs: 5.1 10*3/uL (ref 1.7–7.7)
Neutrophils Relative %: 58 %
Platelets: 170 10*3/uL (ref 150–400)
RBC: 3.87 MIL/uL — ABNORMAL LOW (ref 4.22–5.81)
RDW: 12.9 % (ref 11.5–15.5)
WBC: 8.7 10*3/uL (ref 4.0–10.5)
nRBC: 0 % (ref 0.0–0.2)

## 2022-11-28 LAB — COMPREHENSIVE METABOLIC PANEL
ALT: 10 U/L (ref 0–44)
AST: 11 U/L — ABNORMAL LOW (ref 15–41)
Albumin: 4.2 g/dL (ref 3.5–5.0)
Alkaline Phosphatase: 44 U/L (ref 38–126)
Anion gap: 8 (ref 5–15)
BUN: 14 mg/dL (ref 8–23)
CO2: 27 mmol/L (ref 22–32)
Calcium: 9.3 mg/dL (ref 8.9–10.3)
Chloride: 106 mmol/L (ref 98–111)
Creatinine, Ser: 0.68 mg/dL (ref 0.61–1.24)
GFR, Estimated: >60 mL/min (ref >60)
Glucose, Bld: 101 mg/dL — ABNORMAL HIGH (ref 70–99)
Potassium: 4.1 mmol/L (ref 3.5–5.1)
Sodium: 141 mmol/L (ref 135–145)
Total Bilirubin: 1.1 mg/dL (ref <1.2)
Total Protein: 6.3 g/dL — ABNORMAL LOW (ref 6.5–8.1)

## 2022-11-28 MED ORDER — LEVOFLOXACIN 500 MG PO TABS: 500.0000 mg | ORAL_TABLET | Freq: Every day | ORAL | 0 refills | Status: AC

## 2022-11-28 NOTE — ED Notes (Signed)
Pt voided 650 mL. PVR scan completed, showed 113 mL post void.

## 2022-11-28 NOTE — ED Triage Notes (Signed)
Both testicles swollen, and shaft of penis. No sexual relations. Voiding ok, no fevers

## 2022-11-28 NOTE — ED Notes (Signed)
Bladder scan post void 

## 2022-11-28 NOTE — Discharge Instructions (Addendum)
You were seen in the emerged from today for evaluation of your scrotal swelling.  I have included more information on this into your discharge paperwork.  It shows that you have some hydroceles and varicoceles.  Please review the information attached to the chart.  Additionally, you will need to follow-up with your urologist for this.  Please make sure you call to schedule an appointment.  I am starting on a medication called Levaquin which she will take once daily for the next 10 days.  If you have any concerns, new or worsening symptoms, please return to the nearest emergency department for evaluation.   Contact a health care provider if: Your pain is increasing. You have redness in the affected area. Your testicle is enlarged, swollen, or painful. You have swelling that does not get better when you are lying down. One of your testicles is smaller than the other. You develop swelling in your legs. Get help right away if: You have difficulty breathing. This symptom may be an emergency. Get help right away. Call 911. Do not wait to see if the symptom will go away. Do not drive yourself to the hospital.

## 2022-11-28 NOTE — ED Provider Notes (Signed)
[place] EMERGENCY DEPARTMENT AT [facility] Provider Note   CSN: [id number] Arrival date & time: 11/28/22  1349     History No chief complaint on file.   Kevin Kim is a 77 y.o. male with h/o overactive bladder, anemia, HTN, HLD presents to the ER today for evaluation of scrotal swelling for the past few days. The patient reports he noticed it in the shower. He reports that it feels like his scrotum and testicles are swollen, however there is no pain. Denies any trauma to the area. He denies any dysuria or hematuria. Reports some urgency/frequency however this is at the baseline. Denies any fever, chest pain, SOB, abdominal pain, or bowel changes. He denies any swelling to the penis. Denies any penile discharge or pain. He reports he called his urologist who wanted him to come to the ER for evaluation.  HPI     Home Medications Prior to Admission medications   Medication Sig Start Date End Date Taking? Authorizing Provider  aspirin 81 MG chewable tablet Chew 81 mg by mouth every morning.    [provider]  atorvastatin (LIPITOR) 10 MG tablet  04/16/18   [provider]  Cholecalciferol 2000 UNITS CAPS Take 1 capsule (2,000 Units total) by mouth daily. 01/20/14   Montez Morita, PA-C  Cyanocobalamin (VITAMIN B-12) 5000 MCG TBDP Take 1,000 mcg by mouth once a week.    [provider]  ferrous sulfate 325 (65 FE) MG EC tablet Take 325 mg by mouth 3 (three) times a week.    [provider]  Boris Lown Oil 500 MG CAPS Take 1 capsule by mouth daily at 12 noon.    [provider]  losartan (COZAAR) 50 MG tablet Take 50 mg by mouth daily.    [provider]  Multiple Vitamins-Minerals (MULTIVITAMINS THER. W/MINERALS) TABS Take 1 tablet by mouth every morning. Centrum Silver    [provider]  omeprazole (PRILOSEC) 20 MG capsule Take 20 mg by mouth daily.    [provider]  pregabalin (LYRICA) 100 MG capsule   04/22/18   [provider]      Allergies    Amoxicillin, Erythromycin, and Peanut-containing drug products    Review of Systems   Review of Systems  Constitutional:  Negative for chills and fever.  Respiratory:  Negative for shortness of breath.   Cardiovascular:  Negative for chest pain.  Gastrointestinal:  Negative for abdominal pain, constipation, diarrhea, nausea and vomiting.  Genitourinary:  Positive for frequency, scrotal swelling and urgency. Negative for dysuria, hematuria, penile discharge, penile pain, penile swelling and testicular pain.    Physical Exam Updated Vital Signs BP 137/80 (BP Location: Right Arm)   Pulse 77   Temp 98.5 F (36.9 C) (Oral)   Resp 16   Wt 88.5 kg   SpO2 95%   BMI 31.00 kg/m  Physical Exam Vitals and nursing note reviewed. Exam conducted with a chaperone present Eileen Stanford, NT).  Constitutional:      General: He is not in acute distress.    Appearance: He is not ill-appearing or toxic-appearing.  Eyes:     General: No scleral icterus. Pulmonary:     Effort: Pulmonary effort is normal. No respiratory distress.  Abdominal:     Palpations: Abdomen is soft.     Tenderness: There is no abdominal tenderness. There is no guarding or rebound.  Genitourinary:    Comments: Testicles appear and feel symmetric in size. Scrotal sack appears edematous, but is  without erythema, increase warmth, fluctuance of induration. No tenderness upon palpation. + Prehn's sign. No signs of cellulitis. No fluctuance, induration, or skin changes noted to the perineum. Non tender to palpation here as well.  Skin:    General: Skin is warm and dry.  Neurological:     Mental Status: He is alert.     ED Results / Procedures / Treatments   Labs (all labs ordered are listed, but only abnormal results are displayed) Labs Reviewed  URINALYSIS, ROUTINE W REFLEX MICROSCOPIC - Abnormal; Notable for the following components:      Result Value   Color, Urine  COLORLESS (*)    All other components within normal limits  CBC WITH DIFFERENTIAL/PLATELET - Abnormal; Notable for the following components:   RBC 3.87 (*)    HCT 37.9 (*)    All other components within normal limits  COMPREHENSIVE METABOLIC PANEL - Abnormal; Notable for the following components:   Glucose, Bld 101 (*)    Total Protein 6.3 (*)    AST 11 (*)    All other components within normal limits    EKG None  Radiology US SCROTUM W/DOPPLER  Result Date: 11/28/2022 CLINICAL DATA:  Testicular swelling EXAM: SCROTAL ULTRASOUND DOPPLER ULTRASOUND OF THE TESTICLES TECHNIQUE: Complete ultrasound examination of the testicles, epididymis, and other scrotal structures was performed. Color and spectral Doppler ultrasound were also utilized to evaluate blood flow to the testicles. COMPARISON:  None Available. FINDINGS: Right testicle Measurements: 5.0 x 2.9 x 3.1 cm. No mass or microlithiasis visualized. Left testicle Measurements: 4.7 x 3.4 x 3.7 cm. No mass or microlithiasis visualized. Right epididymis:  Normal in size and appearance. Left epididymis:  Normal in size and appearance. Hydrocele:  Small bilateral hydroceles. Varicocele:  Mild bilateral varicoceles. Scrotal wall: Mild right scrotal wall thickening. No fluid collections. Pulsed Doppler interrogation of both testes demonstrates normal low resistance arterial and venous waveforms bilaterally. IMPRESSION: 1. No evidence of testicular torsion or intratesticular mass. 2. Small bilateral hydroceles. 3. Mild bilateral varicoceles. 4. Mild right scrotal wall thickening, nonspecific. Electronically Signed   By: Duanne Guess D.O.   On: 11/28/2022 17:09      Procedures Procedures   Medications Ordered in ED Medications - No data to display  ED Course/ Medical Decision Making/ A&P                               Medical Decision Making Amount and/or Complexity of Data Reviewed Labs: ordered. Radiology:  ordered.  Risk Prescription drug management.   77 y.o. male presents to the ER for evaluation of scrotal sac swelling. Differential diagnosis includes but is not limited to hydrocele, varicocele, dependent edema, Fournier's gangrene, cellulitis, epididymitis. Vital signs show bradycardia, otherwise unremarkable. Physical exam as noted above.   The patient reports that he sees [facility] for overactive bladder and has been placed on a new medication last month for this. Unfortunately, I am unable to see the notes.   I independently reviewed and interpreted the patient's labs.  CMP shows glucose of 101.  Mildly decreased total protein and AST otherwise no other electrolyte or LFT abnormality.  Urinalysis unremarkable.  CBC shows no leukocytosis.  Slight decrease in hematocrit however normal hemoglobin.  US shows 1. No evidence of testicular torsion or intratesticular mass. 2. Small bilateral hydroceles. 3. Mild bilateral varicoceles. 4. Mild right scrotal wall thickening, nonspecific. Per radiologist's interpretation.    I consulted urology  and spoke with Dr. Jennette Bill. He doesn't recommend any intervention for now and recommends follow up in clinic.   My attending assessed at bedside and recommends Levoloxacin for potential enteric epidydimitis.  He agrees that does not need to be any CT scan.  I do not see any signs of Fournier's.  There is no overlying erythema, fluctuance, or induration into the scrotum, inguinal creases, perineum, or gluteus.  Will have him follow up with urologuy as well.   We discussed the results of the labs/imaging. The plan is take medication, follow up with urology. We discussed strict return precautions and red flag symptoms. The patient verbalized their understanding and agrees to the plan. The patient is stable and being discharged home in good condition.  Portions of this report may have been transcribed using voice recognition software. Every effort was made  to ensure accuracy; however, inadvertent computerized transcription errors may be present.   I discussed this case with my attending physician who cosigned this note including patient's presenting symptoms, physical exam, and planned diagnostics and interventions. Attending physician stated agreement with plan or made changes to plan which were implemented.   Attending physician assessed patient at bedside.  Final Clinical Impression(s) / ED Diagnoses Final diagnoses:  Scrotal swelling  Varicocele  Hydrocele, unspecified hydrocele type    Rx / DC Orders ED Discharge Orders          Ordered    levofloxacin (LEVAQUIN) 500 MG tablet  Daily        11/28/22 1854              Achille Rich, PA-C 12/03/22 1827    Royanne Foots, DO 12/08/22 1101

## 2023-01-17 DIAGNOSIS — Z125 Encounter for screening for malignant neoplasm of prostate: Secondary | ICD-10-CM | POA: Diagnosis not present

## 2023-01-17 DIAGNOSIS — R350 Frequency of micturition: Secondary | ICD-10-CM | POA: Diagnosis not present

## 2023-01-17 DIAGNOSIS — R972 Elevated prostate specific antigen [PSA]: Secondary | ICD-10-CM | POA: Diagnosis not present

## 2023-01-17 DIAGNOSIS — R3912 Poor urinary stream: Secondary | ICD-10-CM | POA: Diagnosis not present

## 2023-01-17 DIAGNOSIS — R339 Retention of urine, unspecified: Secondary | ICD-10-CM | POA: Diagnosis not present

## 2023-01-23 DIAGNOSIS — M79661 Pain in right lower leg: Secondary | ICD-10-CM | POA: Diagnosis not present

## 2023-01-30 DIAGNOSIS — H2513 Age-related nuclear cataract, bilateral: Secondary | ICD-10-CM | POA: Diagnosis not present

## 2023-01-30 DIAGNOSIS — H16222 Keratoconjunctivitis sicca, not specified as Sjogren's, left eye: Secondary | ICD-10-CM | POA: Diagnosis not present

## 2023-02-28 DIAGNOSIS — G3184 Mild cognitive impairment, so stated: Secondary | ICD-10-CM | POA: Diagnosis not present

## 2023-02-28 DIAGNOSIS — G603 Idiopathic progressive neuropathy: Secondary | ICD-10-CM | POA: Diagnosis not present

## 2023-02-28 DIAGNOSIS — M5417 Radiculopathy, lumbosacral region: Secondary | ICD-10-CM | POA: Diagnosis not present

## 2023-03-20 DIAGNOSIS — D509 Iron deficiency anemia, unspecified: Secondary | ICD-10-CM | POA: Diagnosis not present

## 2023-03-20 DIAGNOSIS — I10 Essential (primary) hypertension: Secondary | ICD-10-CM | POA: Diagnosis not present

## 2023-03-20 DIAGNOSIS — I959 Hypotension, unspecified: Secondary | ICD-10-CM | POA: Diagnosis not present

## 2023-04-04 DIAGNOSIS — I10 Essential (primary) hypertension: Secondary | ICD-10-CM | POA: Diagnosis not present

## 2023-04-12 DIAGNOSIS — L814 Other melanin hyperpigmentation: Secondary | ICD-10-CM | POA: Diagnosis not present

## 2023-04-12 DIAGNOSIS — D225 Melanocytic nevi of trunk: Secondary | ICD-10-CM | POA: Diagnosis not present

## 2023-04-12 DIAGNOSIS — D2271 Melanocytic nevi of right lower limb, including hip: Secondary | ICD-10-CM | POA: Diagnosis not present

## 2023-04-12 DIAGNOSIS — L918 Other hypertrophic disorders of the skin: Secondary | ICD-10-CM | POA: Diagnosis not present

## 2023-04-12 DIAGNOSIS — L821 Other seborrheic keratosis: Secondary | ICD-10-CM | POA: Diagnosis not present

## 2023-04-12 DIAGNOSIS — L578 Other skin changes due to chronic exposure to nonionizing radiation: Secondary | ICD-10-CM | POA: Diagnosis not present

## 2023-05-15 DIAGNOSIS — N130 Hydronephrosis with ureteropelvic junction obstruction: Secondary | ICD-10-CM | POA: Diagnosis not present

## 2023-05-15 DIAGNOSIS — R350 Frequency of micturition: Secondary | ICD-10-CM | POA: Diagnosis not present

## 2023-05-20 DIAGNOSIS — K219 Gastro-esophageal reflux disease without esophagitis: Secondary | ICD-10-CM | POA: Diagnosis not present

## 2023-05-20 DIAGNOSIS — N401 Enlarged prostate with lower urinary tract symptoms: Secondary | ICD-10-CM | POA: Diagnosis not present

## 2023-05-20 DIAGNOSIS — H9313 Tinnitus, bilateral: Secondary | ICD-10-CM | POA: Diagnosis not present

## 2023-05-20 DIAGNOSIS — I951 Orthostatic hypotension: Secondary | ICD-10-CM | POA: Diagnosis not present

## 2023-05-20 DIAGNOSIS — K5900 Constipation, unspecified: Secondary | ICD-10-CM | POA: Diagnosis not present

## 2023-05-20 DIAGNOSIS — E7800 Pure hypercholesterolemia, unspecified: Secondary | ICD-10-CM | POA: Diagnosis not present

## 2023-05-20 DIAGNOSIS — I10 Essential (primary) hypertension: Secondary | ICD-10-CM | POA: Diagnosis not present

## 2023-05-20 DIAGNOSIS — D696 Thrombocytopenia, unspecified: Secondary | ICD-10-CM | POA: Diagnosis not present

## 2023-06-06 DIAGNOSIS — M5417 Radiculopathy, lumbosacral region: Secondary | ICD-10-CM | POA: Diagnosis not present

## 2023-06-06 DIAGNOSIS — G603 Idiopathic progressive neuropathy: Secondary | ICD-10-CM | POA: Diagnosis not present

## 2023-06-06 DIAGNOSIS — G3184 Mild cognitive impairment, so stated: Secondary | ICD-10-CM | POA: Diagnosis not present

## 2023-06-10 ENCOUNTER — Ambulatory Visit (INDEPENDENT_AMBULATORY_CARE_PROVIDER_SITE_OTHER)

## 2023-06-10 ENCOUNTER — Encounter: Payer: Self-pay | Admitting: Podiatry

## 2023-06-10 ENCOUNTER — Ambulatory Visit (INDEPENDENT_AMBULATORY_CARE_PROVIDER_SITE_OTHER): Admitting: Podiatry

## 2023-06-10 VITALS — Ht 66.5 in | Wt 195.0 lb

## 2023-06-10 DIAGNOSIS — M2141 Flat foot [pes planus] (acquired), right foot: Secondary | ICD-10-CM | POA: Diagnosis not present

## 2023-06-10 DIAGNOSIS — M2142 Flat foot [pes planus] (acquired), left foot: Secondary | ICD-10-CM

## 2023-06-10 DIAGNOSIS — M2140 Flat foot [pes planus] (acquired), unspecified foot: Secondary | ICD-10-CM | POA: Diagnosis not present

## 2023-06-10 DIAGNOSIS — M19072 Primary osteoarthritis, left ankle and foot: Secondary | ICD-10-CM

## 2023-06-10 DIAGNOSIS — M19071 Primary osteoarthritis, right ankle and foot: Secondary | ICD-10-CM

## 2023-06-10 NOTE — Progress Notes (Signed)
 Chief Complaint  Patient presents with   Flat Foot    Pt is here due to both his feet being flat footed, states he has constant foot pain.    HPI: 78 y.o. male presenting today for evaluation of chronic pain and tenderness associated to the bilateral feet secondary to flatfoot deformity and arthritis.  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  Since that time it has been progressive and intermittent.  He has tried different conservative measures including OTC prefabricated insoles with no relief.  In the past he has had custom orthotics which have helped significantly.  Past Medical History:  Diagnosis Date   Anxiety    while in hospital   BPH (benign prostatic hyperplasia)    Depression    GERD (gastroesophageal reflux disease)    Hypertension    Irregular heartbeat    1980's.   Pneumonia    last time 1993    Past Surgical History:  Procedure Laterality Date   CLOSED REDUCTION TIBIA Right 10/24/2013   Procedure: CLOSED REDUCTION TIBIA/FIBULA FRACTURE;  Surgeon: Bevin Bucks, MD;  [facility] OR;  Service: Orthopedics;  Laterality: Right;   COLONOSCOPY     COSMETIC SURGERY Right    at [facility] 11/15/2013    left wrist to right ankle    EXTERNAL FIXATION LEG Right 10/24/2013   Procedure: EXTERNAL FIXATION RIGHT LOWER LEG;  Surgeon: Bevin Bucks, MD;  Location: [facility] OR;  Service: Orthopedics;  Laterality: Right;   EXTERNAL FIXATION LEG Right 10/27/2013   Procedure:   REVISION OF  EXTERNAL FIXATOR RIGHT LOWER LEG   ORIF  RIGHT TIBIAL PILON APPLICATION OF WOUND VAC;  Surgeon: Arlette Lagos, MD;  Location: [facility] OR;  Service: Orthopedics;  Laterality: Right;   HERNIA REPAIR Bilateral 2000ish   plus umbilicial-    I & D EXTREMITY Right 10/24/2013   Procedure: IRRIGATION AND DEBRIDEMENT OPEN RIGHT TIBIA/FIBULA FRACTURE;  Surgeon: Bevin Bucks, MD;  Location: [facility] OR;  Service: Orthopedics;  Laterality: Right;   ORIF TIBIA  FRACTURE Right 01/2014   ORIF TIBIA FRACTURE Right 01/19/2014   Procedure: OPEN REDUCTION INTERNAL FIXATION (ORIF) TIBIA/FIBULA FRACTURE REPAIR OF TIBIA/FIBULA NON UNION;  Surgeon: Arlette Lagos, MD;  Location: [facility] OR;  Service: Orthopedics;  Laterality: Right;    Allergies  Allergen Reactions   Amoxicillin Other (See Comments)    Gi upset; abdominal pain   Erythromycin Other (See Comments)   Peanut-Containing Drug Products Hives    All nuts     Physical Exam: General: The patient is alert and oriented x3 in no acute distress.  Dermatology: Skin is warm, dry and supple bilateral lower extremities.   Vascular: Palpable pedal pulses bilaterally. Capillary refill within normal limits.  No appreciable edema.  No erythema.  Neurological: Grossly intact via light touch  Musculoskeletal Exam: Pes planovalgus deformity noted with tenderness throughout palpation through the foot.  No crepitus.  Limited ankle joint range of motion right  Radiographic Exam 1/L feet 06/10/2023:  Normal osseous mineralization.  Collapse of the medial longitudinal arch of the foot with pes planovalgus noted on lateral view.  Medial deviation of the talar head also noted.  No acute fracture identified.  Orthopedic hardware noted to the right ankle secondary to pilon fracture ORIF 1996  Assessment/Plan of Care: 1.  Pes planovalgus deformity with DJD bilateral feet 2.  H/o ORIF RT Pilon Fracture  -Patient evaluated.  X-rays reviewed -Patient has a  chronic history of flatfoot deformity with progressive DJD and intermittent pain dating back all the way to [employer] [phone]. -For now recommend conservative treatment -Appointment with orthotics department for custom insoles to support the medial longitudinal arch of the foot and alleviate pain -Continue to advise against going barefoot.  Recommend good supportive tennis shoes and sneakers -Return to clinic with me PRN     Dot Gazella, DPM [facility]  Dr. Dot Gazella, DPM    [address] [place], [place] [address]                Office [phone]  Fax [phone]

## 2023-07-15 ENCOUNTER — Ambulatory Visit

## 2023-07-15 NOTE — Progress Notes (Signed)
 Orthotics   Patient was present and evaluated for Custom molded foot orthotics. Patient will benefit from CFO's to provide total contact to BIL MLA's helping to balance and distribute body weight more evenly across BIL feet helping to reduce plantar pressure and pain. Orthotic will also encourage FF / RF alignment  Patient was scanned today and will return for fitting upon receipt

## 2023-08-16 DIAGNOSIS — S161XXA Strain of muscle, fascia and tendon at neck level, initial encounter: Secondary | ICD-10-CM | POA: Diagnosis not present

## 2023-08-16 DIAGNOSIS — R42 Dizziness and giddiness: Secondary | ICD-10-CM | POA: Diagnosis not present

## 2023-08-16 DIAGNOSIS — M542 Cervicalgia: Secondary | ICD-10-CM | POA: Diagnosis not present

## 2023-08-16 DIAGNOSIS — M79622 Pain in left upper arm: Secondary | ICD-10-CM | POA: Diagnosis not present

## 2023-08-17 DIAGNOSIS — M542 Cervicalgia: Secondary | ICD-10-CM | POA: Diagnosis not present

## 2023-08-17 DIAGNOSIS — M50322 Other cervical disc degeneration at C5-C6 level: Secondary | ICD-10-CM | POA: Diagnosis not present

## 2023-08-21 ENCOUNTER — Ambulatory Visit (INDEPENDENT_AMBULATORY_CARE_PROVIDER_SITE_OTHER)

## 2023-08-21 DIAGNOSIS — M2142 Flat foot [pes planus] (acquired), left foot: Secondary | ICD-10-CM | POA: Diagnosis not present

## 2023-08-21 DIAGNOSIS — M2141 Flat foot [pes planus] (acquired), right foot: Secondary | ICD-10-CM | POA: Diagnosis not present

## 2023-08-21 DIAGNOSIS — M2140 Flat foot [pes planus] (acquired), unspecified foot: Secondary | ICD-10-CM

## 2023-08-21 NOTE — Progress Notes (Signed)
 Patient presents today to pick up custom molded foot orthotics, diagnosed with Pes planus by Dr. Janit.   Orthotics were dispensed and fit was satisfactory. Reviewed instructions for break-in and wear. Written instructions given to patient.  Patient will follow up as needed.   Lolita Schultze CPed, CFo, CFm

## 2023-09-05 DIAGNOSIS — G603 Idiopathic progressive neuropathy: Secondary | ICD-10-CM | POA: Diagnosis not present

## 2023-09-05 DIAGNOSIS — G3184 Mild cognitive impairment, so stated: Secondary | ICD-10-CM | POA: Diagnosis not present

## 2023-09-05 DIAGNOSIS — M5417 Radiculopathy, lumbosacral region: Secondary | ICD-10-CM | POA: Diagnosis not present

## 2023-11-15 DIAGNOSIS — N281 Cyst of kidney, acquired: Secondary | ICD-10-CM | POA: Diagnosis not present

## 2023-11-15 DIAGNOSIS — R350 Frequency of micturition: Secondary | ICD-10-CM | POA: Diagnosis not present

## 2023-11-22 DIAGNOSIS — N401 Enlarged prostate with lower urinary tract symptoms: Secondary | ICD-10-CM | POA: Diagnosis not present

## 2023-11-22 DIAGNOSIS — R350 Frequency of micturition: Secondary | ICD-10-CM | POA: Diagnosis not present

## 2023-11-22 DIAGNOSIS — R3912 Poor urinary stream: Secondary | ICD-10-CM | POA: Diagnosis not present

## 2023-11-22 DIAGNOSIS — R351 Nocturia: Secondary | ICD-10-CM | POA: Diagnosis not present

## 2023-11-27 DIAGNOSIS — Z125 Encounter for screening for malignant neoplasm of prostate: Secondary | ICD-10-CM | POA: Diagnosis not present

## 2023-11-27 DIAGNOSIS — I10 Essential (primary) hypertension: Secondary | ICD-10-CM | POA: Diagnosis not present

## 2023-11-27 DIAGNOSIS — Z6831 Body mass index (BMI) 31.0-31.9, adult: Secondary | ICD-10-CM | POA: Diagnosis not present

## 2023-11-27 DIAGNOSIS — Z79899 Other long term (current) drug therapy: Secondary | ICD-10-CM | POA: Diagnosis not present

## 2023-11-27 DIAGNOSIS — I2510 Atherosclerotic heart disease of native coronary artery without angina pectoris: Secondary | ICD-10-CM | POA: Diagnosis not present

## 2023-11-27 DIAGNOSIS — Z1331 Encounter for screening for depression: Secondary | ICD-10-CM | POA: Diagnosis not present

## 2023-11-27 DIAGNOSIS — Z0000 Encounter for general adult medical examination without abnormal findings: Secondary | ICD-10-CM | POA: Diagnosis not present

## 2023-11-27 DIAGNOSIS — N401 Enlarged prostate with lower urinary tract symptoms: Secondary | ICD-10-CM | POA: Diagnosis not present

## 2023-11-27 DIAGNOSIS — K219 Gastro-esophageal reflux disease without esophagitis: Secondary | ICD-10-CM | POA: Diagnosis not present

## 2023-11-27 DIAGNOSIS — D696 Thrombocytopenia, unspecified: Secondary | ICD-10-CM | POA: Diagnosis not present

## 2023-11-27 DIAGNOSIS — N529 Male erectile dysfunction, unspecified: Secondary | ICD-10-CM | POA: Diagnosis not present

## 2023-11-27 DIAGNOSIS — E6609 Other obesity due to excess calories: Secondary | ICD-10-CM | POA: Diagnosis not present

## 2023-11-27 DIAGNOSIS — G629 Polyneuropathy, unspecified: Secondary | ICD-10-CM | POA: Diagnosis not present

## 2023-11-27 DIAGNOSIS — E7800 Pure hypercholesterolemia, unspecified: Secondary | ICD-10-CM | POA: Diagnosis not present

## 2023-11-27 DIAGNOSIS — E66811 Obesity, class 1: Secondary | ICD-10-CM | POA: Diagnosis not present

## 2023-12-05 DIAGNOSIS — M7918 Myalgia, other site: Secondary | ICD-10-CM | POA: Diagnosis not present

## 2023-12-05 DIAGNOSIS — G603 Idiopathic progressive neuropathy: Secondary | ICD-10-CM | POA: Diagnosis not present

## 2023-12-05 DIAGNOSIS — M5417 Radiculopathy, lumbosacral region: Secondary | ICD-10-CM | POA: Diagnosis not present

## 2023-12-05 DIAGNOSIS — G3184 Mild cognitive impairment, so stated: Secondary | ICD-10-CM | POA: Diagnosis not present
